# Patient Record
Sex: Female | Born: 1960 | Race: White | Hispanic: No | Marital: Married | Smoking: Never smoker
Health system: Southern US, Community
[De-identification: ages and names within clinical notes are randomized; demographics above are authoritative.]

## PROBLEM LIST (undated history)

## (undated) DIAGNOSIS — Z808 Family history of malignant neoplasm of other organs or systems: Secondary | ICD-10-CM

## (undated) DIAGNOSIS — Z800 Family history of malignant neoplasm of digestive organs: Secondary | ICD-10-CM

## (undated) DIAGNOSIS — Z8041 Family history of malignant neoplasm of ovary: Secondary | ICD-10-CM

## (undated) DIAGNOSIS — C801 Malignant (primary) neoplasm, unspecified: Secondary | ICD-10-CM

## (undated) DIAGNOSIS — Z806 Family history of leukemia: Secondary | ICD-10-CM

## (undated) DIAGNOSIS — Z803 Family history of malignant neoplasm of breast: Secondary | ICD-10-CM

## (undated) DIAGNOSIS — A770 Spotted fever due to Rickettsia rickettsii: Secondary | ICD-10-CM

## (undated) DIAGNOSIS — H469 Unspecified optic neuritis: Secondary | ICD-10-CM

## (undated) DIAGNOSIS — F419 Anxiety disorder, unspecified: Secondary | ICD-10-CM

## (undated) DIAGNOSIS — Z8042 Family history of malignant neoplasm of prostate: Secondary | ICD-10-CM

## (undated) DIAGNOSIS — F32A Depression, unspecified: Secondary | ICD-10-CM

## (undated) DIAGNOSIS — Z8489 Family history of other specified conditions: Secondary | ICD-10-CM

## (undated) DIAGNOSIS — E039 Hypothyroidism, unspecified: Secondary | ICD-10-CM

## (undated) DIAGNOSIS — R011 Cardiac murmur, unspecified: Secondary | ICD-10-CM

## (undated) DIAGNOSIS — Z923 Personal history of irradiation: Secondary | ICD-10-CM

## (undated) HISTORY — PX: DENTAL SURGERY: SHX609

## (undated) HISTORY — DX: Family history of malignant neoplasm of prostate: Z80.42

## (undated) HISTORY — DX: Hypothyroidism, unspecified: E03.9

## (undated) HISTORY — DX: Family history of malignant neoplasm of ovary: Z80.41

## (undated) HISTORY — DX: Malignant (primary) neoplasm, unspecified: C80.1

## (undated) HISTORY — DX: Family history of malignant neoplasm of digestive organs: Z80.0

## (undated) HISTORY — DX: Family history of malignant neoplasm of breast: Z80.3

## (undated) HISTORY — PX: TONSILLECTOMY: SHX5217

## (undated) HISTORY — DX: Family history of leukemia: Z80.6

## (undated) HISTORY — DX: Family history of malignant neoplasm of other organs or systems: Z80.8

## (undated) HISTORY — PX: TONSILLECTOMY: SUR1361

---

## 2001-03-23 ENCOUNTER — Ambulatory Visit (HOSPITAL_COMMUNITY): Admission: RE | Admit: 2001-03-23 | Discharge: 2001-03-23 | Payer: Self-pay | Admitting: Family Medicine

## 2004-10-27 HISTORY — PX: MELANOMA EXCISION: SHX5266

## 2004-10-27 HISTORY — PX: SENTINEL NODE BIOPSY: SHX6608

## 2009-05-04 ENCOUNTER — Ambulatory Visit (HOSPITAL_COMMUNITY): Admission: RE | Admit: 2009-05-04 | Discharge: 2009-05-04 | Payer: Self-pay | Admitting: Family Medicine

## 2010-11-17 ENCOUNTER — Encounter: Payer: Self-pay | Admitting: General Surgery

## 2011-12-31 ENCOUNTER — Other Ambulatory Visit (HOSPITAL_COMMUNITY): Payer: Self-pay | Admitting: *Deleted

## 2011-12-31 DIAGNOSIS — Z1231 Encounter for screening mammogram for malignant neoplasm of breast: Secondary | ICD-10-CM

## 2012-01-22 ENCOUNTER — Ambulatory Visit (HOSPITAL_COMMUNITY): Payer: Self-pay | Attending: *Deleted

## 2012-05-19 ENCOUNTER — Other Ambulatory Visit: Payer: Self-pay | Admitting: Nurse Practitioner

## 2012-05-25 ENCOUNTER — Ambulatory Visit
Admission: RE | Admit: 2012-05-25 | Discharge: 2012-05-25 | Disposition: A | Discharge status: Home / Self Care | Payer: No Typology Code available for payment source | Source: Ambulatory Visit | Attending: Nurse Practitioner | Admitting: Nurse Practitioner

## 2013-03-31 ENCOUNTER — Telehealth: Payer: Self-pay | Admitting: Clinical

## 2013-04-01 ENCOUNTER — Telehealth: Payer: Self-pay | Admitting: Clinical

## 2013-04-01 NOTE — Telephone Encounter (Signed)
Error in opening this encounter, wrong patient.

## 2013-04-01 NOTE — Telephone Encounter (Signed)
Error in opening this encounter.

## 2013-06-26 ENCOUNTER — Ambulatory Visit (INDEPENDENT_AMBULATORY_CARE_PROVIDER_SITE_OTHER): Payer: BC Managed Care – PPO | Admitting: Emergency Medicine

## 2013-06-26 VITALS — BP 100/68 | HR 95 | Temp 98.6°F | Resp 16 | Ht 65.0 in | Wt 121.0 lb

## 2013-06-26 DIAGNOSIS — C439 Malignant melanoma of skin, unspecified: Secondary | ICD-10-CM

## 2013-06-26 DIAGNOSIS — N390 Urinary tract infection, site not specified: Secondary | ICD-10-CM

## 2013-06-26 DIAGNOSIS — B009 Herpesviral infection, unspecified: Secondary | ICD-10-CM

## 2013-06-26 DIAGNOSIS — E039 Hypothyroidism, unspecified: Secondary | ICD-10-CM

## 2013-06-26 DIAGNOSIS — R300 Dysuria: Secondary | ICD-10-CM

## 2013-06-26 DIAGNOSIS — Z1211 Encounter for screening for malignant neoplasm of colon: Secondary | ICD-10-CM

## 2013-06-26 DIAGNOSIS — Z0000 Encounter for general adult medical examination without abnormal findings: Secondary | ICD-10-CM

## 2013-06-26 LAB — POCT URINALYSIS DIPSTICK
Bilirubin, UA: NEGATIVE
Glucose, UA: NEGATIVE
Ketones, UA: NEGATIVE
Nitrite, UA: NEGATIVE
Protein, UA: 100
Spec Grav, UA: 1.015
Urobilinogen, UA: 0.2
pH, UA: 5.5

## 2013-06-26 LAB — POCT UA - MICROSCOPIC ONLY
Crystals, Ur, HPF, POC: NEGATIVE
Yeast, UA: NEGATIVE

## 2013-06-26 LAB — LIPID PANEL
LDL Cholesterol: 196 mg/dL — ABNORMAL HIGH (ref 0–99)
Total CHOL/HDL Ratio: 4.6 Ratio
VLDL: 15 mg/dL (ref 0–40)

## 2013-06-26 LAB — COMPREHENSIVE METABOLIC PANEL
ALT: 10 U/L (ref 0–35)
Alkaline Phosphatase: 74 U/L (ref 39–117)
CO2: 26 mEq/L (ref 19–32)
Sodium: 141 mEq/L (ref 135–145)
Total Bilirubin: 0.5 mg/dL (ref 0.3–1.2)
Total Protein: 7.9 g/dL (ref 6.0–8.3)

## 2013-06-26 LAB — POCT CBC
Hemoglobin: 14.4 g/dL (ref 12.2–16.2)
MCH, POC: 31 pg (ref 27–31.2)
MCV: 95.5 fL (ref 80–97)
RBC: 4.64 M/uL (ref 4.04–5.48)
WBC: 12 10*3/uL — AB (ref 4.6–10.2)

## 2013-06-26 LAB — IFOBT (OCCULT BLOOD): IFOBT: NEGATIVE

## 2013-06-26 LAB — T4, FREE: Free T4: 1.03 ng/dL (ref 0.80–1.80)

## 2013-06-26 MED ORDER — VALACYCLOVIR HCL 500 MG PO TABS: 500.0000 mg | ORAL_TABLET | Freq: Every day | ORAL | Status: DC

## 2013-06-26 MED ORDER — CIPROFLOXACIN HCL 250 MG PO TABS: 250.0000 mg | ORAL_TABLET | Freq: Two times a day (BID) | ORAL | Status: DC

## 2013-06-26 NOTE — Patient Instructions (Signed)
Urinary Tract Infection  Urinary tract infections (UTIs) can develop anywhere along your urinary tract. Your urinary tract is your body's drainage system for removing wastes and extra water. Your urinary tract includes two kidneys, two ureters, a bladder, and a urethra. Your kidneys are a pair of bean-shaped organs. Each kidney is about the size of your fist. They are located below your ribs, one on each side of your spine.  CAUSES  Infections are caused by microbes, which are microscopic organisms, including fungi, viruses, and bacteria. These organisms are so small that they can only be seen through a microscope. Bacteria are the microbes that most commonly cause UTIs.  SYMPTOMS   Symptoms of UTIs may vary by age and gender of the patient and by the location of the infection. Symptoms in young women typically include a frequent and intense urge to urinate and a painful, burning feeling in the bladder or urethra during urination. Older women and men are more likely to be tired, shaky, and weak and have muscle aches and abdominal pain. A fever may mean the infection is in your kidneys. Other symptoms of a kidney infection include pain in your back or sides below the ribs, nausea, and vomiting.  DIAGNOSIS  To diagnose a UTI, your caregiver will ask you about your symptoms. Your caregiver also will ask to provide a urine sample. The urine sample will be tested for bacteria and white blood cells. White blood cells are made by your body to help fight infection.  TREATMENT   Typically, UTIs can be treated with medication. Because most UTIs are caused by a bacterial infection, they usually can be treated with the use of antibiotics. The choice of antibiotic and length of treatment depend on your symptoms and the type of bacteria causing your infection.  HOME CARE INSTRUCTIONS   If you were prescribed antibiotics, take them exactly as your caregiver instructs you. Finish the medication even if you feel better after you  have only taken some of the medication.   Drink enough water and fluids to keep your urine clear or pale yellow.   Avoid caffeine, tea, and carbonated beverages. They tend to irritate your bladder.   Empty your bladder often. Avoid holding urine for long periods of time.   Empty your bladder before and after sexual intercourse.   After a bowel movement, women should cleanse from front to back. Use each tissue only once.  SEEK MEDICAL CARE IF:    You have back pain.   You develop a fever.   Your symptoms do not begin to resolve within 3 days.  SEEK IMMEDIATE MEDICAL CARE IF:    You have severe back pain or lower abdominal pain.   You develop chills.   You have nausea or vomiting.   You have continued burning or discomfort with urination.  MAKE SURE YOU:    Understand these instructions.   Will watch your condition.   Will get help right away if you are not doing well or get worse.  Document Released: 07/23/2005 Document Revised: 04/13/2012 Document Reviewed: 11/21/2011  ExitCare Patient Information 2014 [employer].

## 2013-06-26 NOTE — Progress Notes (Signed)
Subjective:    Patient ID: Jean Morgan, female    DOB: 12-19-1960, 52 y.o.   MRN: [id number]  HPI  52 yo caucasian female presents to clinic for urinary frequency, burning with urination, lower abd pressure x 5days.  Pt took OTC Cranactin with some relief.  Pt requests a CPE today also-states she has not had CPE in 3 years due to lack of insurance.  At that time she was diagnosed with hypothyroid- pt chose to take natural remedy Hypothalmus PMG - purchased at [employer], [address], [place].   Pt feels she has gone through the change - no periods, has hot flashes.  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 Pt has history of Stage IV Melanoma - needs name of dermatologist to follow her for skin issues.      Review of Systems     Objective:   Physical Exam HEENT exam is unremarkable. Her neck is supple. Chest is clear to auscultation and percussion. Heart regular rate no murmurs rubs or gallops appreciated. Abdomen soft liver spleen not enlarged or no areas of tenderness. Uterus is normal size there are no adnexal masses. Extremities are without edema. There are 23 mm pigmented areas which are flat on the right leg without surrounding erythema and without dark pigment. Pelvic exam reveals a normal size uterus there are no adnexal masses rectal exam reveals no masses to be palpable hematuria was obtained. Pap smear was done   Results for orders placed in visit on 06/26/13  POCT URINALYSIS DIPSTICK      Result Value Range   Color, UA yellow     Clarity, UA cloudy     Glucose, UA neg     Bilirubin, UA neg     Ketones, UA neg     Spec Grav, UA 1.015     Blood, UA mod     pH, UA 5.5     Protein, UA 100     Urobilinogen, UA 0.2     Nitrite, UA neg     Leukocytes, UA small (1+)    POCT UA - MICROSCOPIC ONLY      Result Value Range   WBC, Ur, HPF, POC tntc     RBC, urine, microscopic tntc     Bacteria, U Microscopic 3+     Mucus, UA mod     Epithelial cells, urine  per micros 0-4     Crystals, Ur, HPF, POC neg     Casts, Ur, LPF, POC broad waxy     Yeast, UA neg    POCT CBC      Result Value Range   WBC 12.0 (*) 4.6 - 10.2 K/uL   Lymph, poc 3.4  0.6 - 3.4   POC LYMPH PERCENT 28.5  10 - 50 %L   MID (cbc) 0.6  0 - 0.9   POC MID % 5.4  0 - 12 %M   POC Granulocyte 7.9 (*) 2 - 6.9   Granulocyte percent 66.1  37 - 80 %G   RBC 4.64  4.04 - 5.48 M/uL   Hemoglobin 14.4  12.2 - 16.2 g/dL   HCT, POC 40.9  81.1 - 47.9 %   MCV 95.5  80 - 97 fL   MCH, POC 31.0  27 - 31.2 pg   MCHC 32.5  31.8 - 35.4 g/dL   RDW, POC 91.4     Platelet Count, POC 286  142 - 424 K/uL   MPV 9.8  0 - 99.8 fL  IFOBT (OCCULT BLOOD)      Result Value Range   IFOBT Negative          Assessment & Plan:  Patient prefers to get her Tdap at a different time. She definitely has a urinary tract infection. Urine culture was done we'll treat with Cipro 250 twice a day #10 and have her take probiotics. Ambulatory referrals made to GI and also to dermatology.

## 2013-06-28 LAB — PAP IG (IMAGE GUIDED)

## 2013-06-28 LAB — URINE CULTURE

## 2013-07-01 ENCOUNTER — Telehealth: Payer: Self-pay

## 2013-07-01 NOTE — Telephone Encounter (Signed)
Spoke to someone on Tuesday or Wednesday about getting her labs mailed to her and she has not received them yet she is wanting to make sure they were sent out Call back number is [phone]

## 2013-07-01 NOTE — Telephone Encounter (Signed)
They were mailed our mail is slow, it goes by snail, first it goes to the hospital. She is advised also have released labs in Mychart.

## 2013-09-01 ENCOUNTER — Other Ambulatory Visit: Payer: Self-pay

## 2013-10-18 ENCOUNTER — Ambulatory Visit: Payer: BC Managed Care – PPO | Admitting: Emergency Medicine

## 2013-11-21 ENCOUNTER — Telehealth: Payer: Self-pay

## 2013-11-21 DIAGNOSIS — B009 Herpesviral infection, unspecified: Secondary | ICD-10-CM

## 2013-11-21 MED ORDER — VALACYCLOVIR HCL 500 MG PO TABS: 500.0000 mg | ORAL_TABLET | Freq: Every day | ORAL | Status: DC

## 2013-11-21 NOTE — Telephone Encounter (Signed)
Refill sent. Pt advised.  

## 2013-11-21 NOTE — Telephone Encounter (Signed)
Patient needs a refill on Valtrex.  [phone]

## 2013-12-09 ENCOUNTER — Telehealth: Payer: Self-pay | Admitting: Pediatrics

## 2013-12-09 NOTE — Telephone Encounter (Signed)
This message was created in the parent's chart.  It should be created in the patient's chart.  Please close this message and create a new message in the appropriate chart.

## 2013-12-09 NOTE — Telephone Encounter (Signed)
Mom called wanted to know if she can get a rx for tamiflu the family that are living in the house are all sick with the flu so she wants to know if she can give the child something now before she gets sick

## 2014-03-03 ENCOUNTER — Ambulatory Visit (INDEPENDENT_AMBULATORY_CARE_PROVIDER_SITE_OTHER): Payer: BC Managed Care – PPO | Admitting: Physician Assistant

## 2014-03-03 VITALS — BP 112/64 | HR 76 | Temp 98.3°F | Resp 16 | Ht 68.75 in | Wt 134.4 lb

## 2014-03-03 DIAGNOSIS — H6980 Other specified disorders of Eustachian tube, unspecified ear: Secondary | ICD-10-CM

## 2014-03-03 DIAGNOSIS — H6990 Unspecified Eustachian tube disorder, unspecified ear: Secondary | ICD-10-CM

## 2014-03-03 DIAGNOSIS — H9209 Otalgia, unspecified ear: Secondary | ICD-10-CM

## 2014-03-03 MED ORDER — VALACYCLOVIR HCL 500 MG PO TABS
500.0000 mg | ORAL_TABLET | ORAL | Status: DC | PRN
Start: 2014-03-03 — End: 2014-09-01

## 2014-03-03 MED ORDER — IPRATROPIUM BROMIDE 0.03 % NA SOLN: 2.0000 | Freq: Two times a day (BID) | NASAL | Status: DC

## 2014-03-03 NOTE — Progress Notes (Signed)
   Subjective:    Patient ID: Jean Morgan, female    DOB: 16-Oct-1961, 53 y.o.   MRN: [id number]  HPI  53 year old female presents for evaluation of left ear pain x 2 days. Symptoms started yesterday and have continued today.  She also has noticed some lymph node swelling and tenderness on her left side.  Has pain with swallowing but no sore throat. Admits to some nasal congestion and PND that she attributes to allergies . Has been taking Zyrtec and Aleve which have helped.  Hx of ear infections with last episode "years" ago.   No fever, chills, headache, dizziness, nausea, vomiting, tinnitus, or ear drainage.  Patient also requesting refill on her valtrex that she takes as needed.    Review of Systems  Constitutional: Negative for fever and chills.  HENT: Positive for ear pain, postnasal drip and rhinorrhea. Negative for congestion and sore throat.   Eyes: Negative for visual disturbance.       Objective:   Physical Exam  Constitutional: She is oriented to person, place, and time. She appears well-developed and well-nourished.  HENT:  Head: Normocephalic and atraumatic.  Right Ear: Hearing, external ear and ear canal normal. A middle ear effusion is present.  Left Ear: Hearing, external ear and ear canal normal. No tenderness. Tympanic membrane is not erythematous and not bulging. A middle ear effusion is present.  Mouth/Throat: Oropharynx is clear and moist.  Eyes: Conjunctivae are normal.  Neck: Normal range of motion. Neck supple.  Cardiovascular: Normal rate, regular rhythm and normal heart sounds.   Pulmonary/Chest: Effort normal and breath sounds normal.  Lymphadenopathy:    She has cervical adenopathy (left side).  Neurological: She is alert and oriented to person, place, and time.  Psychiatric: She has a normal mood and affect. Her behavior is normal. Judgment and thought content normal.          Assessment & Plan:  ETD (eustachian tube dysfunction) - Plan: ipratropium  (ATROVENT) 0.03 % nasal spray  Will treat ETD with Atrovent NS twice daily. Continue zyrtec daily Aleve q12hours prn pain. May alternate with tylenol as needed Refilled Valtrex to take prn Consider amox if symptoms fail to improve in 48-72 hours Follow up if symptoms worsening or changing.

## 2014-09-01 ENCOUNTER — Ambulatory Visit (INDEPENDENT_AMBULATORY_CARE_PROVIDER_SITE_OTHER): Payer: BC Managed Care – PPO | Admitting: Emergency Medicine

## 2014-09-01 VITALS — BP 128/86 | HR 70 | Temp 98.4°F | Resp 16 | Ht 66.0 in | Wt 134.0 lb

## 2014-09-01 DIAGNOSIS — Z1239 Encounter for other screening for malignant neoplasm of breast: Secondary | ICD-10-CM

## 2014-09-01 DIAGNOSIS — B009 Herpesviral infection, unspecified: Secondary | ICD-10-CM

## 2014-09-01 DIAGNOSIS — Z1211 Encounter for screening for malignant neoplasm of colon: Secondary | ICD-10-CM

## 2014-09-01 DIAGNOSIS — Z0000 Encounter for general adult medical examination without abnormal findings: Secondary | ICD-10-CM

## 2014-09-01 DIAGNOSIS — C439 Malignant melanoma of skin, unspecified: Secondary | ICD-10-CM

## 2014-09-01 LAB — POCT UA - MICROSCOPIC ONLY
Bacteria, U Microscopic: NEGATIVE
CRYSTALS, UR, HPF, POC: NEGATIVE
Casts, Ur, LPF, POC: NEGATIVE
Epithelial cells, urine per micros: NEGATIVE
Mucus, UA: NEGATIVE
RBC, urine, microscopic: NEGATIVE
WBC, UR, HPF, POC: NEGATIVE
YEAST UA: NEGATIVE

## 2014-09-01 LAB — POCT URINALYSIS DIPSTICK
Bilirubin, UA: NEGATIVE
Blood, UA: NEGATIVE
GLUCOSE UA: NEGATIVE
Ketones, UA: NEGATIVE
Leukocytes, UA: NEGATIVE
Nitrite, UA: NEGATIVE
Protein, UA: NEGATIVE
SPEC GRAV UA: 1.01
UROBILINOGEN UA: 0.2
pH, UA: 7

## 2014-09-01 LAB — POCT CBC
Granulocyte percent: 57.4 %G (ref 37–80)
HCT, POC: 41.8 % (ref 37.7–47.9)
HEMOGLOBIN: 13.9 g/dL (ref 12.2–16.2)
Lymph, poc: 3.7 — AB (ref 0.6–3.4)
MCH: 30.7 pg (ref 27–31.2)
MCHC: 33.2 g/dL (ref 31.8–35.4)
MCV: 92.5 fL (ref 80–97)
MID (CBC): 0.4 (ref 0–0.9)
MPV: 7.8 fL (ref 0–99.8)
POC Granulocyte: 5.5 (ref 2–6.9)
POC LYMPH PERCENT: 38.7 %L (ref 10–50)
POC MID %: 3.9 %M (ref 0–12)
Platelet Count, POC: 307 10*3/uL (ref 142–424)
RBC: 4.52 M/uL (ref 4.04–5.48)
RDW, POC: 13.9 %
WBC: 9.5 10*3/uL (ref 4.6–10.2)

## 2014-09-01 MED ORDER — VALACYCLOVIR HCL 500 MG PO TABS: 500.0000 mg | ORAL_TABLET | ORAL | Status: DC | PRN

## 2014-09-01 NOTE — Progress Notes (Signed)
Subjective:    Patient ID: Jean Morgan, female    DOB: 04/11/1961, 53 y.o.   MRN: [id number] This chart was scribed for [place]. Everlene Farrier, MD by Steva Colder, ED Scribe. The patient was seen in room 10 at 10:37 AM.   Chief Complaint  Patient presents with  . Annual Exam    no pap    HPI Jean Morgan is a 53 y.o. female with a hx of Hypothyroidism who presents today complaining of annual exam without pap. She states that she wants to get her blood work done to see how her cholesterol is and her thyroid because she has Hypothyroidism.   She denies any associated symptoms. She denies having a PCP. She denies going to an OB/GYN recently. She states that she had stage 4 skin CA that was dx by Dr. Aniceto Boss and she was seen at [facility] for. She states that her surgery was in 2006. She states that she saw someone in town and was not pleased with them. 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    Patient Active Problem List   Diagnosis Date Noted  . Melanoma of skin, site unspecified 06/26/2013   Past Medical History  Diagnosis Date  . Hypothyroidism   . Cancer    Past Surgical History  Procedure Laterality Date  . Dental surgery    . Tonsillectomy     Allergies  Allergen Reactions  . Latex    Prior to Admission medications   Medication Sig Start Date End Date Taking? Authorizing Provider  valACYclovir (VALTREX) 500 MG tablet Take 1 tablet (500 mg total) by mouth as needed. 03/03/14  Yes Heather M Marte, PA-C  ipratropium (ATROVENT) 0.03 % nasal spray Place 2 sprays into the nose 2 (two) times daily. 03/03/14   Collene Leyden, PA-C      Review of Systems  Constitutional: Negative for fever and chills.  HENT: Negative for congestion, ear pain and rhinorrhea.   Neurological: Negative for weakness and headaches.  All other systems reviewed and are negative.     Objective:   Physical Exam  Constitutional: She is oriented to person, place, and time.  She appears well-developed and well-nourished. No distress.  HENT:  Head: Normocephalic and atraumatic.  Eyes: EOM are normal.  Neck: Neck supple. No tracheal deviation present.  Cardiovascular: Normal rate.   Pulmonary/Chest: Effort normal. No respiratory distress.  Genitourinary:  External genitalia is normal. No masses.   Musculoskeletal: Normal range of motion.  Neurological: She is alert and oriented to person, place, and time.  Skin: Skin is warm and dry.  No suspicious lesions noted. 3-4 mm papilloma on the right nipple.   Psychiatric: She has a normal mood and affect. Her behavior is normal.  Nursing note and vitals reviewed.  Results for orders placed or performed in visit on 09/01/14  POCT urinalysis dipstick  Result Value Ref Range   Color, UA light yellow    Clarity, UA clear    Glucose, UA neg    Bilirubin, UA neg    Ketones, UA neg    Spec Grav, UA 1.010    Blood, UA neg    pH, UA 7.0    Protein, UA neg    Urobilinogen, UA 0.2    Nitrite, UA neg    Leukocytes, UA Negative   POCT UA - Microscopic Only  Result Value Ref Range   WBC, Ur, HPF, POC neg  RBC, urine, microscopic neg    Bacteria, U Microscopic neg    Mucus, UA neg    Epithelial cells, urine per micros neg    Crystals, Ur, HPF, POC neg    Casts, Ur, LPF, POC neg    Yeast, UA neg   POCT CBC  Result Value Ref Range   WBC 9.5 4.6 - 10.2 K/uL   Lymph, poc 3.7 (A) 0.6 - 3.4   POC LYMPH PERCENT 38.7 10 - 50 %L   MID (cbc) 0.4 0 - 0.9   POC MID % 3.9 0 - 12 %M   POC Granulocyte 5.5 2 - 6.9   Granulocyte percent 57.4 37 - 80 %G   RBC 4.52 4.04 - 5.48 M/uL   Hemoglobin 13.9 12.2 - 16.2 g/dL   HCT, POC 41.8 37.7 - 47.9 %   MCV 92.5 80 - 97 fL   MCH, POC 30.7 27 - 31.2 pg   MCHC 33.2 31.8 - 35.4 g/dL   RDW, POC 13.9 %   Platelet Count, POC 307 142 - 424 K/uL   MPV 7.8 0 - 99.8 fL     BP 128/86 mmHg  Pulse 70  Temp(Src) 98.4 F (36.9 C) (Oral)  Resp 16  Ht 5\' 6"  (1.676 m)  Wt 134 lb  (60.782 kg)  BMI 21.64 kg/m2  SpO2 100% Assessment & Plan:  DIAGNOSTIC STUDIES: Oxygen Saturation is 100% on room air, normal by my interpretation.    COORDINATION OF CARE: 10:47 AM-Discussed treatment plan which includes CBC, UA, and CMAT and lipid check, medication refill with pt at bedside and pt agreed to plan.    I personally performed the services described in this documentation, which was scribed in my presence. The recorded information has been reviewed and is accurate.

## 2014-09-02 LAB — COMPREHENSIVE METABOLIC PANEL
ALT: 14 U/L (ref 0–35)
AST: 19 U/L (ref 0–37)
Albumin: 4.6 g/dL (ref 3.5–5.2)
Alkaline Phosphatase: 69 U/L (ref 39–117)
BUN: 9 mg/dL (ref 6–23)
CALCIUM: 9.7 mg/dL (ref 8.4–10.5)
CHLORIDE: 104 meq/L (ref 96–112)
CO2: 24 meq/L (ref 19–32)
CREATININE: 0.68 mg/dL (ref 0.50–1.10)
Glucose, Bld: 85 mg/dL (ref 70–99)
Potassium: 4.6 mEq/L (ref 3.5–5.3)
Sodium: 140 mEq/L (ref 135–145)
Total Bilirubin: 0.5 mg/dL (ref 0.2–1.2)
Total Protein: 7.3 g/dL (ref 6.0–8.3)

## 2014-09-02 LAB — TSH: TSH: 3.145 u[IU]/mL (ref 0.350–4.500)

## 2014-09-02 LAB — T4, FREE: Free T4: 1.07 ng/dL (ref 0.80–1.80)

## 2014-09-03 ENCOUNTER — Encounter: Payer: Self-pay | Admitting: *Deleted

## 2014-09-06 ENCOUNTER — Telehealth: Payer: Self-pay

## 2014-09-06 DIAGNOSIS — Z0000 Encounter for general adult medical examination without abnormal findings: Secondary | ICD-10-CM

## 2014-09-06 NOTE — Telephone Encounter (Signed)
Can we add these for her?

## 2014-09-06 NOTE — Telephone Encounter (Signed)
Pt states she had tests done and didn't know if we checked her chol level since it was high already, please call [phone]

## 2014-09-06 NOTE — Telephone Encounter (Signed)
Patient says there was no cholesterol or liver panel blood work done at her last visit and she concerned about those levels.

## 2014-09-07 ENCOUNTER — Encounter: Payer: Self-pay | Admitting: Internal Medicine

## 2014-09-07 NOTE — Telephone Encounter (Signed)
On the comprehensive medical panel her liver tests were done and were normal. She does need to have a lipid panel. I believe it was not done because she was not fasting at the time. We can place and orders only and she can have a fasting lipid panel done without seeing me.

## 2014-09-08 ENCOUNTER — Ambulatory Visit (AMBULATORY_SURGERY_CENTER): Payer: BC Managed Care – PPO | Admitting: *Deleted

## 2014-09-08 VITALS — Ht 66.0 in | Wt 134.0 lb

## 2014-09-08 DIAGNOSIS — Z1211 Encounter for screening for malignant neoplasm of colon: Secondary | ICD-10-CM

## 2014-09-08 MED ORDER — MOVIPREP 100 G PO SOLR: 1.0000 | Freq: Once | ORAL | Status: DC

## 2014-09-08 NOTE — Progress Notes (Signed)
No egg or soy allergy. No anesthesia problems.  No home O2.  No diet meds.  

## 2014-09-09 NOTE — Telephone Encounter (Signed)
Pt will be in for lipid test 09/10/14

## 2014-09-13 ENCOUNTER — Other Ambulatory Visit (INDEPENDENT_AMBULATORY_CARE_PROVIDER_SITE_OTHER): Payer: BC Managed Care – PPO | Admitting: Radiology

## 2014-09-13 DIAGNOSIS — Z1239 Encounter for other screening for malignant neoplasm of breast: Secondary | ICD-10-CM

## 2014-09-13 DIAGNOSIS — Z0000 Encounter for general adult medical examination without abnormal findings: Secondary | ICD-10-CM

## 2014-09-13 LAB — LIPID PANEL
Cholesterol: 273 mg/dL — ABNORMAL HIGH (ref 0–200)
HDL: 68 mg/dL (ref >39)
LDL Cholesterol: 188 mg/dL — ABNORMAL HIGH (ref 0–99)
TRIGLYCERIDES: 84 mg/dL (ref <150)
Total CHOL/HDL Ratio: 4 Ratio
VLDL: 17 mg/dL (ref 0–40)

## 2014-09-13 NOTE — Progress Notes (Signed)
Pt here for labs only. 

## 2014-09-14 NOTE — Progress Notes (Signed)
Patient is scheduled to have a mammogram on 09/30/2014

## 2014-09-15 ENCOUNTER — Encounter: Payer: Self-pay | Admitting: Internal Medicine

## 2014-09-29 ENCOUNTER — Ambulatory Visit (HOSPITAL_COMMUNITY)
Admission: RE | Admit: 2014-09-29 | Discharge: 2014-09-29 | Disposition: A | Discharge status: Home / Self Care | Payer: BC Managed Care – PPO | Source: Ambulatory Visit | Attending: Emergency Medicine | Admitting: Emergency Medicine

## 2014-09-29 DIAGNOSIS — Z1231 Encounter for screening mammogram for malignant neoplasm of breast: Secondary | ICD-10-CM | POA: Diagnosis not present

## 2014-10-02 ENCOUNTER — Other Ambulatory Visit: Payer: Self-pay | Admitting: Emergency Medicine

## 2014-10-02 ENCOUNTER — Encounter: Payer: Self-pay | Admitting: Internal Medicine

## 2014-10-02 ENCOUNTER — Ambulatory Visit (AMBULATORY_SURGERY_CENTER): Payer: BC Managed Care – PPO | Admitting: Internal Medicine

## 2014-10-02 VITALS — BP 162/95 | HR 65 | Temp 98.2°F | Resp 14 | Ht 66.0 in | Wt 134.0 lb

## 2014-10-02 DIAGNOSIS — Z1231 Encounter for screening mammogram for malignant neoplasm of breast: Secondary | ICD-10-CM

## 2014-10-02 DIAGNOSIS — Z1211 Encounter for screening for malignant neoplasm of colon: Secondary | ICD-10-CM

## 2014-10-02 MED ORDER — SODIUM CHLORIDE 0.9 % IV SOLN: 500.0000 mL | INTRAVENOUS | Status: DC

## 2014-10-02 NOTE — Op Note (Signed)
[facility]  [address] [place], [id number]   COLONOSCOPY PROCEDURE REPORT  PATIENT: Jean Morgan, Jean Morgan  MR#: [id number] BIRTHDATE: Jun 29, 1961 , 58  yrs. old GENDER: female ENDOSCOPIST: Jerene Bears, MD REFERRED [id number] Everlene Farrier, M.D. PROCEDURE DATE:  10/02/2014 PROCEDURE:   Colonoscopy, screening First Screening Colonoscopy - Avg.  risk and is 50 yrs.  old or older Yes.  Prior Negative Screening - Now for repeat screening. N/A  History of Adenoma - Now for follow-up colonoscopy & has been > or = to 3 yrs.  N/A  Polyps Removed Today? No.  Polyps Removed Today? No.  Recommend repeat exam, <10 yrs? Polyps Removed Today? No.  Recommend repeat exam, <10 yrs? No. ASA CLASS:   Class II INDICATIONS:average risk for colon cancer and first colonoscopy. MEDICATIONS: Propofol 260 mg IV and Monitored anesthesia care  DESCRIPTION OF PROCEDURE:   After the risks benefits and alternatives of the procedure were thoroughly explained, informed consent was obtained.  The digital rectal exam revealed no abnormalities of the rectum.   The LB [id number]  endoscope was introduced through the anus and advanced to the cecum, which was identified by both the appendix and ileocecal valve. No adverse events experienced.   The quality of the prep was good, using MoviPrep  The instrument was then slowly withdrawn as the colon was fully examined.     COLON FINDINGS: A normal appearing cecum, ileocecal valve, and appendiceal orifice were identified.  The ascending, transverse, descending, sigmoid colon, and rectum appeared unremarkable. Retroflexed views revealed small internal hemorrhoids. The time to cecum=5 minutes 56 seconds.  Withdrawal time=8 minutes 36 seconds. The scope was withdrawn and the procedure completed.  COMPLICATIONS: There were no immediate complications.  ENDOSCOPIC IMPRESSION: Normal colonoscopy, small internal hemorrhoids  RECOMMENDATIONS: You should continue  to follow colorectal cancer screening guidelines for "routine risk" patients with a repeat colonoscopy in 10 years. There is no need for FOBT (stool) testing for at least 5 years.  eSigned:  Jerene Bears, MD 10/02/2014 8:52 AM   cc: Arlyss Queen, MD and The Patient

## 2014-10-02 NOTE — Progress Notes (Signed)
Report to PACU, RN, vss, BBS= Clear.  

## 2014-10-02 NOTE — Patient Instructions (Signed)
Discharge instructions given. Normal exam. Resume previous medications. YOU HAD AN ENDOSCOPIC PROCEDURE TODAY AT THE [facility]: Refer to the procedure report that was given to you for any specific questions about what was found during the examination.  If the procedure report does not answer your questions, please call your gastroenterologist to clarify.  If you requested that your care partner not be given the details of your procedure findings, then the procedure report has been included in a sealed envelope for you to review at your convenience later.  YOU SHOULD EXPECT: Some feelings of bloating in the abdomen. Passage of more gas than usual.  Walking can help get rid of the air that was put into your GI tract during the procedure and reduce the bloating. If you had a lower endoscopy (such as a colonoscopy or flexible sigmoidoscopy) you may notice spotting of blood in your stool or on the toilet paper. If you underwent a bowel prep for your procedure, then you may not have a normal bowel movement for a few days.  DIET: Your first meal following the procedure should be a light meal and then it is ok to progress to your normal diet.  A half-sandwich or bowl of soup is an example of a good first meal.  Heavy or fried foods are harder to digest and may make you feel nauseous or bloated.  Likewise meals heavy in dairy and vegetables can cause extra gas to form and this can also increase the bloating.  Drink plenty of fluids but you should avoid alcoholic beverages for 24 hours.  ACTIVITY: Your care partner should take you home directly after the procedure.  You should plan to take it easy, moving slowly for the rest of the day.  You can resume normal activity the day after the procedure however you should NOT DRIVE or use heavy machinery for 24 hours (because of the sedation medicines used during the test).    SYMPTOMS TO REPORT IMMEDIATELY: A gastroenterologist can be reached at any hour.   During normal business hours, 8:30 AM to 5:00 PM Monday through Friday, call [phone].  After hours and on weekends, please call the GI answering service at [phone] who will take a message and have the physician on call contact you.   Following lower endoscopy (colonoscopy or flexible sigmoidoscopy):  Excessive amounts of blood in the stool  Significant tenderness or worsening of abdominal pains  Swelling of the abdomen that is new, acute  Fever of 100F or higher  FOLLOW UP: If any biopsies were taken you will be contacted by phone or by letter within the next 1-3 weeks.  Call your gastroenterologist if you have not heard about the biopsies in 3 weeks.  Our staff will call the home number listed on your records the next business day following your procedure to check on you and address any questions or concerns that you may have at that time regarding the information given to you following your procedure. This is a courtesy call and so if there is no answer at the home number and we have not heard from you through the emergency physician on call, we will assume that you have returned to your regular daily activities without incident.  SIGNATURES/CONFIDENTIALITY: You and/or your care partner have signed paperwork which will be entered into your electronic medical record.  These signatures attest to the fact that that the information above on your After Visit Summary has been reviewed and is understood.    Full responsibility of the confidentiality of this discharge information lies with you and/or your care-partner.

## 2014-10-03 ENCOUNTER — Telehealth: Payer: Self-pay

## 2014-10-03 NOTE — Telephone Encounter (Signed)
Pt is returning call to Amy

## 2014-10-03 NOTE — Telephone Encounter (Signed)
  Follow up Call-  Call back number 10/02/2014  Post procedure Call Back phone  # [phone]  Permission to leave phone message Yes     Patient questions:  Do you have a fever, pain , or abdominal swelling? No. Pain Score  0 *  Have you tolerated food without any problems? Yes.    Have you been able to return to your normal activities? No.  Do you have any questions about your discharge instructions: Diet   No. Medications  No. Follow up visit  No.  Do you have questions or concerns about your Care? No.  Actions: * If pain score is 4 or above: No action needed, pain <4.

## 2014-10-04 ENCOUNTER — Other Ambulatory Visit: Payer: Self-pay | Admitting: Emergency Medicine

## 2014-10-04 ENCOUNTER — Other Ambulatory Visit: Payer: Self-pay | Admitting: Radiology

## 2014-10-04 DIAGNOSIS — R928 Other abnormal and inconclusive findings on diagnostic imaging of breast: Secondary | ICD-10-CM

## 2014-10-04 DIAGNOSIS — N6489 Other specified disorders of breast: Secondary | ICD-10-CM

## 2014-10-04 NOTE — Telephone Encounter (Signed)
Spoke to her, she has not gotten call yet for mammogram views / [place]. Screening MM was at [facility].

## 2014-10-05 NOTE — Telephone Encounter (Signed)
Have spoken to referrals, [facility] does screening, Butch Penny will make sure this gets sent to the [facility].

## 2014-10-12 ENCOUNTER — Ambulatory Visit
Admission: RE | Admit: 2014-10-12 | Discharge: 2014-10-12 | Disposition: A | Discharge status: Home / Self Care | Payer: BC Managed Care – PPO | Source: Ambulatory Visit | Attending: Emergency Medicine | Admitting: Emergency Medicine

## 2014-10-12 DIAGNOSIS — R928 Other abnormal and inconclusive findings on diagnostic imaging of breast: Secondary | ICD-10-CM

## 2014-10-18 ENCOUNTER — Other Ambulatory Visit: Payer: BC Managed Care – PPO

## 2014-11-03 ENCOUNTER — Telehealth: Payer: Self-pay

## 2014-11-03 NOTE — Telephone Encounter (Signed)
Patients 55 year old brother passed away and she is requesting valium for a few days to deal with his loss.   [facility] on [address]   [phone]

## 2014-11-04 ENCOUNTER — Other Ambulatory Visit: Payer: Self-pay | Admitting: Emergency Medicine

## 2014-11-04 MED ORDER — DIAZEPAM 5 MG PO TABS: ORAL_TABLET | ORAL | Status: DC

## 2014-11-04 NOTE — Telephone Encounter (Signed)
Faxed. Pt notified 

## 2014-11-04 NOTE — Telephone Encounter (Signed)
Tell her I am so sorry for her loss. Rx can be picked up at [address]

## 2015-03-16 ENCOUNTER — Other Ambulatory Visit: Payer: Self-pay | Admitting: Emergency Medicine

## 2015-04-04 ENCOUNTER — Encounter (HOSPITAL_COMMUNITY): Payer: Self-pay | Admitting: Emergency Medicine

## 2015-04-04 ENCOUNTER — Emergency Department (HOSPITAL_COMMUNITY)
Admission: EM | Admit: 2015-04-04 | Discharge: 2015-04-04 | Disposition: A | Discharge status: Home / Self Care | Payer: BC Managed Care – PPO | Source: Home / Self Care | Attending: Family Medicine | Admitting: Family Medicine

## 2015-04-04 DIAGNOSIS — N390 Urinary tract infection, site not specified: Secondary | ICD-10-CM | POA: Diagnosis not present

## 2015-04-04 LAB — POCT URINALYSIS DIP (DEVICE)
BILIRUBIN URINE: NEGATIVE
GLUCOSE, UA: NEGATIVE mg/dL
Hgb urine dipstick: NEGATIVE
NITRITE: NEGATIVE
PH: 5.5 (ref 5.0–8.0)
PROTEIN: NEGATIVE mg/dL
Specific Gravity, Urine: 1.01 (ref 1.005–1.030)
Urobilinogen, UA: 0.2 mg/dL (ref 0.0–1.0)

## 2015-04-04 MED ORDER — TERCONAZOLE 80 MG VA SUPP: 80.0000 mg | Freq: Every day | VAGINAL | Status: DC

## 2015-04-04 MED ORDER — FLUCONAZOLE 150 MG PO TABS: 150.0000 mg | ORAL_TABLET | Freq: Once | ORAL | Status: DC

## 2015-04-04 MED ORDER — CEPHALEXIN 500 MG PO CAPS: 500.0000 mg | ORAL_CAPSULE | Freq: Four times a day (QID) | ORAL | Status: DC

## 2015-04-04 NOTE — ED Provider Notes (Signed)
CSN: [id number]     Arrival date & time 04/04/15  1628 History   First MD Initiated Contact with Patient 04/04/15 1656     Chief Complaint  Patient presents with  . Vaginitis   (Consider location/radiation/quality/duration/timing/severity/associated sxs/prior Treatment) Patient is a 54 y.o. female presenting with frequency. The history is provided by the patient.  Urinary Frequency This is a new problem. The current episode started more than 1 week ago (has tried 5 days of monistat.). The problem has not changed since onset.Associated symptoms include abdominal pain. Pertinent negatives include no chest pain.    Past Medical History  Diagnosis Date  . Hypothyroidism   . Cancer     skin   Past Surgical History  Procedure Laterality Date  . Dental surgery    . Tonsillectomy     Family History  Problem Relation Age of Onset  . Colon polyps Mother   . Colitis Mother   . Heart disease Father   . Colon cancer Paternal Aunt   . Diabetes Maternal Grandmother    History  Substance Use Topics  . Smoking status: Never Smoker   . Smokeless tobacco: Never Used  . Alcohol Use: 0.0 oz/week    1-3 Glasses of wine per week   OB History    No data available     Review of Systems  Constitutional: Negative.  Negative for fever.  Cardiovascular: Negative for chest pain.  Gastrointestinal: Positive for abdominal pain. Negative for nausea and vomiting.  Genitourinary: Positive for dysuria, urgency, frequency and pelvic pain. Negative for vaginal bleeding, vaginal discharge and menstrual problem.    Allergies  Latex  Home Medications   Prior to Admission medications   Medication Sig Start Date End Date Taking? Authorizing Provider  Ascorbic Acid (VITAMIN C ER PO) Take by mouth.    Historical Provider, MD  cephALEXin (KEFLEX) 500 MG capsule Take 1 capsule (500 mg total) by mouth 4 (four) times daily. Take all of medicine and drink lots of fluids 04/04/15   Billy Fischer, MD   Cholecalciferol (VITAMIN D PO) Take by mouth.    Historical Provider, MD  diazepam (VALIUM) 5 MG tablet Take one half to one every 6 to 8 hours for stress 11/04/14   Darlyne Russian, MD  fluconazole (DIFLUCAN) 150 MG tablet Take 1 tablet (150 mg total) by mouth once. May repeat in 1 week 04/04/15   Billy Fischer, MD  fluorouracil (EFUDEX) 5 % cream  09/28/14   Historical Provider, MD  GARLIC PO Take by mouth.    Historical Provider, MD  terconazole (TERAZOL 3) 80 MG vaginal suppository Place 1 suppository (80 mg total) vaginally at bedtime. 04/04/15   Billy Fischer, MD  valACYclovir (VALTREX) 500 MG tablet TAKE 1 TABLET BY MOUTH EVERY DAY AS NEEDED 03/16/15   Darlyne Russian, MD   BP 144/85 mmHg  Pulse 86  Temp(Src) 98.5 F (36.9 C) (Oral)  Resp 16  SpO2 100% Physical Exam  Constitutional: She is oriented to person, place, and time. She appears well-developed and well-nourished. No distress.  Abdominal: Soft. Bowel sounds are normal. She exhibits no mass. There is tenderness in the suprapubic area. There is no rigidity, no rebound and no guarding.  Neurological: She is alert and oriented to person, place, and time.  Skin: Skin is warm and dry.  Nursing note and vitals reviewed.   ED Course  Procedures (including critical care time) Labs Review Labs Reviewed  POCT URINALYSIS DIP (DEVICE) -  Abnormal; Notable for the following:    Ketones, ur TRACE (*)    Leukocytes, UA SMALL (*)    All other components within normal limits    Imaging Review No results found.   MDM   1. UTI (lower urinary tract infection)        Billy Fischer, MD 04/04/15 [phone]

## 2015-04-05 ENCOUNTER — Telehealth: Payer: Self-pay | Admitting: *Deleted

## 2015-04-05 NOTE — Telephone Encounter (Signed)
Pt called has appointment 04/06/15 as new patient was seen at urgent care for uti and yeast having problems with Rx keflex burning tongue. I advised pt that to keep scheduled appointment with nancy tomorrow, as [place] of the [place]. No other symptoms were noted by patient besides the burning of the tongue.

## 2015-04-06 ENCOUNTER — Ambulatory Visit: Payer: Self-pay | Admitting: Women's Health

## 2015-07-31 ENCOUNTER — Encounter: Payer: Self-pay | Admitting: Emergency Medicine

## 2015-09-28 ENCOUNTER — Other Ambulatory Visit: Payer: Self-pay | Admitting: Emergency Medicine

## 2015-10-01 NOTE — Telephone Encounter (Signed)
Called pt who reported that she was given #20 last Oct 31, 2022 when her brother died. She only has about #2 left and with holidays and anniversary of his death coming up, it has been a "little rough" and would like to have some on hand in case needed.

## 2015-10-01 NOTE — Telephone Encounter (Signed)
Please call patient and get clarification about what she takes the Valium for. Then forward back to me.

## 2015-10-02 NOTE — Telephone Encounter (Signed)
Rx faxed

## 2015-11-20 ENCOUNTER — Encounter: Payer: Self-pay | Admitting: Physician Assistant

## 2015-12-26 ENCOUNTER — Encounter: Payer: Self-pay | Admitting: Family Medicine

## 2016-01-02 ENCOUNTER — Encounter: Payer: Self-pay | Admitting: Family Medicine

## 2016-07-12 ENCOUNTER — Ambulatory Visit (INDEPENDENT_AMBULATORY_CARE_PROVIDER_SITE_OTHER): Payer: BC Managed Care – PPO | Admitting: Physician Assistant

## 2016-07-12 ENCOUNTER — Encounter: Payer: Self-pay | Admitting: Physician Assistant

## 2016-07-12 VITALS — BP 118/80 | HR 83 | Temp 98.5°F | Resp 18 | Ht 66.0 in | Wt 133.0 lb

## 2016-07-12 DIAGNOSIS — Z760 Encounter for issue of repeat prescription: Secondary | ICD-10-CM | POA: Diagnosis not present

## 2016-07-12 DIAGNOSIS — R1032 Left lower quadrant pain: Secondary | ICD-10-CM | POA: Diagnosis not present

## 2016-07-12 DIAGNOSIS — Z1329 Encounter for screening for other suspected endocrine disorder: Secondary | ICD-10-CM

## 2016-07-12 DIAGNOSIS — R300 Dysuria: Secondary | ICD-10-CM | POA: Diagnosis not present

## 2016-07-12 LAB — POCT WET + KOH PREP
Trich by wet prep: ABSENT
Yeast by KOH: ABSENT
Yeast by wet prep: ABSENT

## 2016-07-12 LAB — POCT CBC
GRANULOCYTE PERCENT: 59.2 % (ref 37–80)
HEMATOCRIT: 38.3 % (ref 37.7–47.9)
Hemoglobin: 13.5 g/dL (ref 12.2–16.2)
Lymph, poc: 3.8 — AB (ref 0.6–3.4)
MCH: 31.4 pg — AB (ref 27–31.2)
MCHC: 35.3 g/dL (ref 31.8–35.4)
MCV: 88.9 fL (ref 80–97)
MID (CBC): 0.6 (ref 0–0.9)
MPV: 7.8 fL (ref 0–99.8)
POC GRANULOCYTE: 6.4 (ref 2–6.9)
POC LYMPH PERCENT: 35 %L (ref 10–50)
POC MID %: 5.8 % (ref 0–12)
Platelet Count, POC: 265 10*3/uL (ref 142–424)
RBC: 4.31 M/uL (ref 4.04–5.48)
RDW, POC: 13 %
WBC: 10.8 10*3/uL — AB (ref 4.6–10.2)

## 2016-07-12 LAB — POCT URINALYSIS DIP (MANUAL ENTRY)
Bilirubin, UA: NEGATIVE
GLUCOSE UA: NEGATIVE
Ketones, POC UA: NEGATIVE
Leukocytes, UA: NEGATIVE
Nitrite, UA: NEGATIVE
Protein Ur, POC: NEGATIVE
RBC UA: NEGATIVE
UROBILINOGEN UA: 0.2
pH, UA: 5.5

## 2016-07-12 LAB — POC MICROSCOPIC URINALYSIS (UMFC): Mucus: ABSENT

## 2016-07-12 LAB — TSH: TSH: 2.52 m[IU]/L

## 2016-07-12 MED ORDER — VALACYCLOVIR HCL 1 G PO TABS: 1000.0000 mg | ORAL_TABLET | Freq: Two times a day (BID) | ORAL | 0 refills | Status: DC

## 2016-07-12 NOTE — Progress Notes (Signed)
07/12/2016 2:41 PM   DOB: Jan 19, 1961 / MRN: [id number]  SUBJECTIVE:  Jean Morgan is a 55 y.o. female presenting for possible UTI.  She got a test kit at the drug store and this was positive for leukocytes. She associates pain with urination that she describes as burning or spasm.  She associates urgency and frequency.  She denies fever and flank pain today.    She denies any abnormal vaginal discharge. She is having constipation on and off with some diarrhea.    She is allergic to latex.   She  has a past medical history of Cancer ([place]) and Hypothyroidism.    She  reports that she has never smoked. She has never used smokeless tobacco. She reports that she drinks alcohol. She reports that she does not use drugs. She  has no sexual activity history on file. The patient  has a past surgical history that includes Dental surgery and Tonsillectomy.  Her family history includes Colitis in her mother; Colon cancer in her paternal aunt; Colon polyps in her mother; Diabetes in her maternal grandmother; Heart disease in her father.  ROS  The problem list and medications were reviewed and updated by myself where necessary and exist elsewhere in the encounter.   OBJECTIVE:  BP 118/80   Pulse 83   Temp 98.5 F (36.9 C) (Oral)   Resp 18   Ht '5\' 6"'  (1.676 m)   Wt 133 lb (60.3 kg)   SpO2 98%   BMI 21.47 kg/m   Physical Exam  Constitutional: She is oriented to person, place, and time.  Cardiovascular: Normal rate and regular rhythm.   Pulmonary/Chest: Effort normal and breath sounds normal.  Abdominal: Soft. Bowel sounds are normal. She exhibits no distension and no mass. There is tenderness. There is no rebound and no guarding.  Genitourinary: Vagina normal. Cervix exhibits no motion tenderness, no discharge and no friability. No vaginal discharge found.  Musculoskeletal: Normal range of motion.  Neurological: She is alert and oriented to person, place, and time. She has normal reflexes.  No cranial nerve deficit.  Skin: Skin is warm and dry.  Psychiatric: She has a normal mood and affect.    Results for orders placed or performed in visit on 07/12/16 (from the past 72 hour(s))  POCT urinalysis dipstick     Status: None   Collection Time: 07/12/16  1:57 PM  Result Value Ref Range   Color, UA yellow yellow   Clarity, UA clear clear   Glucose, UA negative negative   Bilirubin, UA negative negative   Ketones, POC UA negative negative   Spec Grav, UA <=1.005    Blood, UA negative negative   pH, UA 5.5    Protein Ur, POC negative negative   Urobilinogen, UA 0.2    Nitrite, UA Negative Negative   Leukocytes, UA Negative Negative  POCT Microscopic Urinalysis ([facility])     Status: None   Collection Time: 07/12/16  1:58 PM  Result Value Ref Range   WBC,UR,HPF,POC None None WBC/hpf   RBC,UR,HPF,POC None None RBC/hpf   Bacteria None None, Too numerous to count   Mucus Absent Absent   Epithelial Cells, UR Per Microscopy None None, Too numerous to count cells/hpf  POCT CBC     Status: Abnormal   Collection Time: 07/12/16  2:23 PM  Result Value Ref Range   WBC 10.8 (A) 4.6 - 10.2 K/uL   Lymph, poc 3.8 (A) 0.6 - 3.4   POC LYMPH PERCENT 35.0  10 - 50 %L   MID (cbc) 0.6 0 - 0.9   POC MID % 5.8 0 - 12 %M   POC Granulocyte 6.4 2 - 6.9   Granulocyte percent 59.2 37 - 80 %G   RBC 4.31 4.04 - 5.48 M/uL   Hemoglobin 13.5 12.2 - 16.2 g/dL   HCT, POC 38.3 37.7 - 47.9 %   MCV 88.9 80 - 97 fL   MCH, POC 31.4 (A) 27 - 31.2 pg   MCHC 35.3 31.8 - 35.4 g/dL   RDW, POC 13.0 %   Platelet Count, POC 265 142 - 424 K/uL   MPV 7.8 0 - 99.8 fL  POCT Wet + KOH Prep     Status: None   Collection Time: 07/12/16  2:25 PM  Result Value Ref Range   Yeast by KOH Absent Present, Absent   Yeast by wet prep Absent Present, Absent   WBC by wet prep None None, Few, Too numerous to count   Clue Cells Wet Prep HPF POC None None, Too numerous to count   Trich by wet prep Absent Present, Absent    Bacteria Wet Prep HPF POC None None, Few, Too numerous to count   Epithelial Cells By [employer] ([facility]) None None, Few, Too numerous to count   RBC,UR,HPF,POC None None RBC/hpf    No results found.  ASSESSMENT AND PLAN  Nikaya was seen today for dysuria, abdominal pain and medication refill.  Diagnoses and all orders for this visit:  Dysuria: There does not appear to be any pathology regarding her work up.  Will hold off on treatment for now. Low threshold for urinary abx should she not improve with time.   -     POCT urinalysis dipstick -     POCT Microscopic Urinalysis ([facility]) -     POCT Wet + KOH Prep  LLQ abdominal pain -     POCT CBC  Thyroid disorder screening -     TSH  Medication refill -     valACYclovir (VALTREX) 1000 MG tablet; Take 1 tablet (1,000 mg total) by mouth 2 (two) times daily.    The patient is advised to call or return to clinic if she does not see an improvement in symptoms, or to seek the care of the closest emergency department if she worsens with the above plan.   Philis Fendt, MHS, PA-C [facility] 07/12/2016 2:41 PM

## 2016-07-17 ENCOUNTER — Other Ambulatory Visit: Payer: Self-pay | Admitting: Emergency Medicine

## 2016-07-17 DIAGNOSIS — Z1231 Encounter for screening mammogram for malignant neoplasm of breast: Secondary | ICD-10-CM

## 2016-07-18 ENCOUNTER — Encounter: Payer: Self-pay | Admitting: *Deleted

## 2016-08-12 ENCOUNTER — Telehealth: Payer: Self-pay

## 2016-08-12 NOTE — Telephone Encounter (Signed)
Pt has order for MM SCREENING BREAST TOMO BILATERAL via Dr. Everlene Farrier. [facility] called to sched, pt reported lump on breast and [facility] advised consulting with dr prior to scheduling. Can we follow up another provider and get her an apt with imaging if necessary? Thanks!

## 2016-08-15 NOTE — Telephone Encounter (Signed)
Left message to schedule f/u office visit with provider for lump on breast before scheduling mammogram

## 2016-09-10 ENCOUNTER — Telehealth: Payer: Self-pay

## 2016-09-10 NOTE — Telephone Encounter (Signed)
Pt was seen on 07-12-16 and had a valtrex prescription sent in.  We did not [facility] to [facility] [address] [facility] she had asked and the pharmacy that we did send it to never notified her that we had sent it there.  She now needs to get it. Can we send in a new script to [facility] [address]? [phone]

## 2016-09-12 MED ORDER — VALACYCLOVIR HCL 1 G PO TABS: 1000.0000 mg | ORAL_TABLET | Freq: Two times a day (BID) | ORAL | 0 refills | Status: DC

## 2016-09-12 NOTE — Telephone Encounter (Signed)
[facility] Pt aware.

## 2016-09-12 NOTE — Telephone Encounter (Signed)
Pt calling about Valtrex and wanting [facility] [address]

## 2016-11-28 ENCOUNTER — Encounter: Payer: Self-pay | Admitting: Family Medicine

## 2016-11-28 ENCOUNTER — Other Ambulatory Visit: Payer: Self-pay | Admitting: Family Medicine

## 2016-11-28 ENCOUNTER — Telehealth: Payer: Self-pay

## 2016-11-28 ENCOUNTER — Telehealth: Payer: Self-pay | Admitting: Family Medicine

## 2016-11-28 ENCOUNTER — Ambulatory Visit (INDEPENDENT_AMBULATORY_CARE_PROVIDER_SITE_OTHER): Payer: BC Managed Care – PPO | Admitting: Family Medicine

## 2016-11-28 VITALS — BP 118/78 | HR 70 | Resp 12 | Ht 66.0 in | Wt 133.5 lb

## 2016-11-28 DIAGNOSIS — Z20828 Contact with and (suspected) exposure to other viral communicable diseases: Secondary | ICD-10-CM

## 2016-11-28 DIAGNOSIS — E785 Hyperlipidemia, unspecified: Secondary | ICD-10-CM | POA: Insufficient documentation

## 2016-11-28 DIAGNOSIS — A6000 Herpesviral infection of urogenital system, unspecified: Secondary | ICD-10-CM | POA: Insufficient documentation

## 2016-11-28 DIAGNOSIS — F418 Other specified anxiety disorders: Secondary | ICD-10-CM

## 2016-11-28 DIAGNOSIS — R5382 Chronic fatigue, unspecified: Secondary | ICD-10-CM

## 2016-11-28 DIAGNOSIS — Z1231 Encounter for screening mammogram for malignant neoplasm of breast: Secondary | ICD-10-CM

## 2016-11-28 DIAGNOSIS — F329 Major depressive disorder, single episode, unspecified: Secondary | ICD-10-CM | POA: Insufficient documentation

## 2016-11-28 DIAGNOSIS — N6081 Other benign mammary dysplasias of right breast: Secondary | ICD-10-CM

## 2016-11-28 DIAGNOSIS — F419 Anxiety disorder, unspecified: Secondary | ICD-10-CM | POA: Insufficient documentation

## 2016-11-28 LAB — COMPREHENSIVE METABOLIC PANEL
ALK PHOS: 67 U/L (ref 39–117)
ALT: 12 U/L (ref 0–35)
AST: 17 U/L (ref 0–37)
Albumin: 5.1 g/dL (ref 3.5–5.2)
BILIRUBIN TOTAL: 0.5 mg/dL (ref 0.2–1.2)
BUN: 12 mg/dL (ref 6–23)
CO2: 27 meq/L (ref 19–32)
Calcium: 9.9 mg/dL (ref 8.4–10.5)
Chloride: 104 mEq/L (ref 96–112)
Creatinine, Ser: 0.76 mg/dL (ref 0.40–1.20)
GFR: 83.36 mL/min (ref >60.00)
GLUCOSE: 90 mg/dL (ref 70–99)
Potassium: 4 mEq/L (ref 3.5–5.1)
Sodium: 143 mEq/L (ref 135–145)
TOTAL PROTEIN: 7.7 g/dL (ref 6.0–8.3)

## 2016-11-28 LAB — LIPID PANEL
CHOL/HDL RATIO: 3
Cholesterol: 291 mg/dL — ABNORMAL HIGH (ref 0–200)
HDL: 84.3 mg/dL (ref >39.00)
LDL Cholesterol: 195 mg/dL — ABNORMAL HIGH (ref 0–99)
NONHDL: 206.63
Triglycerides: 59 mg/dL (ref 0.0–149.0)
VLDL: 11.8 mg/dL (ref 0.0–40.0)

## 2016-11-28 LAB — CBC
HEMATOCRIT: 41.2 % (ref 36.0–46.0)
Hemoglobin: 14.2 g/dL (ref 12.0–15.0)
MCHC: 34.4 g/dL (ref 30.0–36.0)
MCV: 91.9 fl (ref 78.0–100.0)
Platelets: 289 10*3/uL (ref 150.0–400.0)
RBC: 4.49 Mil/uL (ref 3.87–5.11)
RDW: 13.4 % (ref 11.5–15.5)
WBC: 7.8 10*3/uL (ref 4.0–10.5)

## 2016-11-28 LAB — VITAMIN D 25 HYDROXY (VIT D DEFICIENCY, FRACTURES): VITD: 44.06 ng/mL (ref 30.00–100.00)

## 2016-11-28 LAB — TSH: TSH: 3.73 u[IU]/mL (ref 0.35–4.50)

## 2016-11-28 MED ORDER — OSELTAMIVIR PHOSPHATE 75 MG PO CAPS: 75.0000 mg | ORAL_CAPSULE | Freq: Every day | ORAL | 0 refills | Status: AC

## 2016-11-28 NOTE — Progress Notes (Signed)
HPI:   [name] is a 56 y.o. female, who is here today to establish care with me.  Former PCP: [facility]  Last preventive routine visit: 2015  Chronic medical problems: Recurrent genital herpes, anxiety,depression, and HLD.    Concerns today: She would like "everything checked." She has followed with "funtional doctor", last visit 2015. Provider usually orders lab work and she would like same labs. She brought a copy or prior results and otherwise normal, except for mildly elevated cholesterol (CBC,TSH,thyroid abs, T3,T4,CMP,25 OH vit D, FLP among some). She is concerned about her risk of having a CV event.   Reporting Hx of hypothyroidism, TSH has been in normal range by lab ranges but functional provider would like less than 3. She took "natural" medication. She denies diarrhea, or constipation, cold/heat intolerance, abdominal wt loss.  "Place on chest" Nodular lesion closed to right breast, noted 5 years ago. She follows annually with dermatologists,lesion was already evaluated. She did not have it remove because was not sure about the cost. She denies any pain, growing slowly, she has not noted erythema. She would like it remove because it "smells."     + Fatigue. Chronic and stable. She has Hx of depression and anxiety. She has been on Valum as needed, last taken 1-2 years. FHx: Brother died from opioid overdose, first husband committed suicide. She drinks alcohol daily, beer and wine, > 8 oz daily. She lives with husband.  Was on Lexapro before but she felt like she can try with natural medicine   -She has Hx of recurrent genital herpes for many years, episodes are not as frequent as they used to be. She takes valtrex as needed.  -She is also concerned about exposure to influenza. 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 Yesterday she visited one of the residents and she was diagnosed with  influenza as well. She has not noted symptoms so far.She has not received vaccination and she is not interested in doing so.   Review of Systems  Constitutional: Positive for fatigue. Negative for activity change, appetite change, fever and unexpected weight change.  HENT: Negative for mouth sores, nosebleeds, trouble swallowing and voice change.   Eyes: Negative for redness and visual disturbance.  Respiratory: Negative for cough, shortness of breath and wheezing.   Cardiovascular: Negative for chest pain and leg swelling.  Gastrointestinal: Negative for abdominal pain, nausea and vomiting.       Negative for changes in bowel habits.  Endocrine: Negative for cold intolerance, heat intolerance, polydipsia, polyphagia and polyuria.  Genitourinary: Negative for decreased urine volume, difficulty urinating and hematuria.  Musculoskeletal: Negative for gait problem and myalgias.  Skin: Negative for rash.  Neurological: Negative for syncope, weakness and headaches.  Psychiatric/Behavioral: Positive for sleep disturbance. Negative for confusion and suicidal ideas. The patient is nervous/anxious.       Current Outpatient Prescriptions on File Prior to Visit  Medication Sig Dispense Refill  . Ascorbic Acid (VITAMIN C ER PO) Take by mouth.    . Cholecalciferol (VITAMIN D PO) Take by mouth.    Marland Kitchen GARLIC PO Take by mouth.     No current facility-administered medications on file prior to visit.      Past Medical History:  Diagnosis Date  . Cancer ([place])    skin  . Hypothyroidism    Allergies  Allergen Reactions  . Latex     Family History  Problem  Relation Age of Onset  . Colon polyps Mother   . Colitis Mother   . Arthritis Mother   . Hyperlipidemia Mother   . Diabetes Mother   . Heart disease Father   . Hyperlipidemia Father   . Hypertension Father   . Colon cancer Paternal Aunt   . Diabetes Maternal Grandmother     Social History   Social History  . Marital status:  Single    Spouse name: N/A  . Number of children: N/A  . Years of education: N/A   Social History Main Topics  . Smoking status: Never Smoker  . Smokeless tobacco: Never Used  . Alcohol use 0.0 oz/week    1 - 3 Glasses of wine per week  . Drug use: No  . Sexual activity: Not Asked   Other Topics Concern  . None   Social History Narrative  . None    Vitals:   11/28/16 0754  BP: 118/78  Pulse: 70  Resp: 12  O2 sat 98% at RA.  Body mass index is 21.55 kg/m.   Physical Exam  Nursing note and vitals reviewed. Constitutional: She is oriented to person, place, and time. She appears well-developed and well-nourished. No distress.  HENT:  Head: Atraumatic.  Mouth/Throat: Oropharynx is clear and moist and mucous membranes are normal.  Eyes: Conjunctivae are normal. Pupils are equal, round, and reactive to light. Left eye exhibits normal extraocular motion.  Right exophoria.   Neck: No tracheal deviation present. No thyroid mass and no thyromegaly present.  Cardiovascular: Normal rate and regular rhythm.   No murmur heard. Pulses:      Dorsalis pedis pulses are 2+ on the right side, and 2+ on the left side.  Respiratory: Effort normal and breath sounds normal. No respiratory distress.  GI: Soft. She exhibits no mass. There is no hepatomegaly. There is no tenderness.  Musculoskeletal: She exhibits no edema.  Lymphadenopathy:    She has no cervical adenopathy.  Neurological: She is alert and oriented to person, place, and time. She has normal strength. Coordination and gait normal.  Skin: Skin is warm. No rash noted. No erythema.     2-2.5 cm,nodular,soft lesion medial to right breast.No tender,no erythema, mobile.  Psychiatric: Her mood appears anxious. Her affect is labile. She expresses no suicidal ideation.  Well groomed, good eye contact.      ASSESSMENT AND PLAN:    Jean Morgan was seen today for establish care.  Diagnoses and all orders for this visit:   Lab  Results  Component Value Date   TSH 3.73 11/28/2016   Lab Results  Component Value Date   CHOL 291 (H) 11/28/2016   HDL 84.30 11/28/2016   LDLCALC 195 (H) 11/28/2016   TRIG 59.0 11/28/2016   CHOLHDL 3 11/28/2016     Chemistry      Component Value Date/Time   NA 143 11/28/2016 0902   K 4.0 11/28/2016 0902   CL 104 11/28/2016 0902   CO2 27 11/28/2016 0902   BUN 12 11/28/2016 0902   CREATININE 0.76 11/28/2016 0902   CREATININE 0.68 09/01/2014 1057      Component Value Date/Time   CALCIUM 9.9 11/28/2016 0902   ALKPHOS 67 11/28/2016 0902   AST 17 11/28/2016 0902   ALT 12 11/28/2016 0902   BILITOT 0.5 11/28/2016 0902      Chronic fatigue  We discussed possible etiologies. Some blood work done today but she has had some work-up done and otherwise negative. She has  Hx of depression and anxiety, these 2 certainly can be contributing factors.   -     TSH -     CBC -     VITAMIN D 25 Hydroxy (Vit-D Deficiency, Fractures)  Hyperlipidemia, unspecified hyperlipidemia type  Continue low fat diet, she is not interested in taking statins. Further recommendations will be given according to lab results. In regard to CV risk we discussed primary prevention, current recommendations in regard to screening, and Aspirin for primary prevention. Decrease alcohol intake also recommended, up to 4 oz of alcohol is recommended for women, beer does not have major benefit.  F/U in 6-12 months.  -     Lipid panel -     Comprehensive metabolic panel  Anxiety and depression  She is not interested in pharmacologic treatment. Psychotherapy recommended, she states that she tried before and did not help.  Exposure to influenza  Prophylactic treatment recommended. She refused vaccination.  -     oseltamivir (TAMIFLU) 75 MG capsule; Take 1 capsule (75 mg total) by mouth daily.  Sebaceous cyst of skin of right breast  She already followed with dermatologists and recommendations in regard to  treatment were given. Explained that it is a benign lesion, if not causing symptoms she could monitor, she feels like lesions "stinks."She will follow with dermatology if she decides to have procedure done.   Recurrent genital herpes  Stable. Requesting refills.  No changes in current management. F/U in 12 months.  -     valACYclovir (VALTREX) 1000 MG tablet; Start ASAP after symptoms onset and within 48 hours:1 tab daily for 5 days prn.        [name]. [name], MD  [facility]. [place] office.

## 2016-11-28 NOTE — Telephone Encounter (Signed)
Pt seen today and forgot to ask Dr [name] if she thinks it is a good idea to do the "life line screening".  Pt had information, it involves several test like carotid artery with ultrasound, peripheral artery screening, using ultrasound and BP readings.  Pt realizes this would be something she would have to pay for out of pocket, but wants to know if a waste of time or good idea?Marland Kitchen

## 2016-11-28 NOTE — Progress Notes (Signed)
Pre visit review using our clinic review tool, if applicable. No additional management support is needed unless otherwise documented below in the visit note. 

## 2016-11-28 NOTE — Patient Instructions (Addendum)
A few things to remember from today's visit:   Exposure to influenza - Plan: oseltamivir (TAMIFLU) 75 MG capsule  Hyperlipidemia, unspecified hyperlipidemia type - Plan: Lipid panel, Comprehensive metabolic panel  Anxiety and depression  Chronic fatigue - Plan: TSH, CBC, VITAMIN D 25 Hydroxy (Vit-D Deficiency, Fractures)  Please schedule preventive visit.   Please be sure medication list is accurate. If a new problem present, please set up appointment sooner than planned today.

## 2016-11-28 NOTE — Telephone Encounter (Signed)
Patient requesting refill on Valtrex.  Refill okay?

## 2016-11-30 ENCOUNTER — Encounter: Payer: Self-pay | Admitting: Family Medicine

## 2016-11-30 MED ORDER — VALACYCLOVIR HCL 1 G PO TABS: ORAL_TABLET | ORAL | 2 refills | Status: DC

## 2016-12-01 NOTE — Telephone Encounter (Signed)
Called and spoke with patient. Relayed message below and patient verbalized understanding.

## 2016-12-01 NOTE — Telephone Encounter (Signed)
Rx was sent to her pharmacy.  Thanks, BJ 

## 2016-12-01 NOTE — Telephone Encounter (Signed)
In general these type of tests are not recommended for screening purpuses.  BP readings she can do at home. I usually check pulses during OV's.  Thanks, BJ

## 2016-12-02 ENCOUNTER — Ambulatory Visit
Admission: RE | Admit: 2016-12-02 | Discharge: 2016-12-02 | Disposition: A | Discharge status: Home / Self Care | Payer: BC Managed Care – PPO | Source: Ambulatory Visit | Attending: Family Medicine | Admitting: Family Medicine

## 2016-12-02 DIAGNOSIS — Z1231 Encounter for screening mammogram for malignant neoplasm of breast: Secondary | ICD-10-CM

## 2016-12-03 ENCOUNTER — Other Ambulatory Visit: Payer: Self-pay | Admitting: Family Medicine

## 2016-12-03 DIAGNOSIS — R928 Other abnormal and inconclusive findings on diagnostic imaging of breast: Secondary | ICD-10-CM

## 2016-12-09 ENCOUNTER — Ambulatory Visit
Admission: RE | Admit: 2016-12-09 | Discharge: 2016-12-09 | Disposition: A | Discharge status: Home / Self Care | Payer: BC Managed Care – PPO | Source: Ambulatory Visit | Attending: Family Medicine | Admitting: Family Medicine

## 2016-12-09 DIAGNOSIS — R928 Other abnormal and inconclusive findings on diagnostic imaging of breast: Secondary | ICD-10-CM

## 2017-03-31 NOTE — Progress Notes (Signed)
HPI:   [name] is a 56 y.o. female, who is here today for her routine physical.  She established care on 11/28/16. Last CPE in 2015.  Regular exercise 3 or more time per week: No Following a healthy diet: Yes. She lives with husband.  Chronic medical problems: Chronic fatigue, depression, recurrent genital herpes, HLD.  She follows with dermatologists annually, Dr Jean Morgan. Last eye exam: 2018.  Pap smear 05/2013 Hx of abnormal pap smears: None in the past 10 years. Hx of STD's: Genital herpes.  Sexually active. G:1 L:1  M:15 LMP: earlier 20's.  Still drinks daily wine > 2 glasses, she doe snot specified, also has been drinking beer lately.  Mammogram: 11/2016 [facility] 1 Colonoscopy: 09/2014.   Hep C screening: Denies risk factors.  She has no new concerns today.  She has a sebaceous cysts on right breast, already evaluated by her dermatologists. She would like me to remove sebaceous cyst. We have addressed this last visit, she tells me that her dermatologists charges about $500 and was hoping that it would be cheaper here.  HDL: She refused pharmacologic treatment. She has tried to follow a healthy diet, low fat diet.  She also would like another refill for Valtrex. She has Hx of genital herpes, 3-4 episodes per year.  Also concerned about worsening hearing loss. She is not sure which ear, she has had hearing test years ago and was told she had mild hearing loss. She denies earache.   Review of Systems  Constitutional: Positive for fatigue. Negative for appetite change, fever and unexpected weight change.  HENT: Positive for hearing loss. Negative for dental problem, mouth sores, nosebleeds, sore throat, trouble swallowing and voice change.   Eyes: Negative for redness and visual disturbance.  Respiratory: Negative for cough, shortness of breath and wheezing.   Cardiovascular: Negative for chest pain and leg swelling.  Gastrointestinal: Negative for  abdominal pain, blood in stool, nausea and vomiting.       No changes in bowel habits.  Endocrine: Negative for cold intolerance, heat intolerance, polydipsia, polyphagia and polyuria.  Genitourinary: Negative for decreased urine volume, dysuria, hematuria, menstrual problem, vaginal bleeding and vaginal discharge.       No breast tenderness or nipple discharge.  Musculoskeletal: Positive for arthralgias. Negative for gait problem and myalgias.  Skin: Negative for color change and rash.  Allergic/Immunologic: Positive for environmental allergies.  Neurological: Negative for syncope, weakness and headaches.  Hematological: Negative for adenopathy. Does not bruise/bleed easily.  Psychiatric/Behavioral: Positive for sleep disturbance. Negative for confusion and suicidal ideas. The patient is nervous/anxious.   All other systems reviewed and are negative.     Current Outpatient Prescriptions on File Prior to Visit  Medication Sig Dispense Refill  . Ascorbic Acid (VITAMIN C ER PO) Take by mouth.    . Cholecalciferol (VITAMIN D PO) Take by mouth.    Marland Kitchen GARLIC PO Take by mouth.     No current facility-administered medications on file prior to visit.      Past Medical History:  Diagnosis Date  . Cancer ([place])    skin  . Hypothyroidism     Allergies  Allergen Reactions  . Latex   . Oxycodone Other (See Comments)    Other Reaction: "too strong a drug for me"   Past Surgical History:  Procedure Laterality Date  . DENTAL SURGERY    . TONSILLECTOMY       Family History  Problem Relation Age of Onset  .  Colon polyps Mother   . Colitis Mother   . Arthritis Mother   . Hyperlipidemia Mother   . Diabetes Mother   . Heart disease Father   . Hyperlipidemia Father   . Hypertension Father   . Colon cancer Paternal Aunt   . Diabetes Maternal Grandmother   . Breast cancer Maternal Aunt     Social History   Social History  . Marital status: Single    Spouse name: N/A  . Number  of children: N/A  . Years of education: N/A   Social History Main Topics  . Smoking status: Never Smoker  . Smokeless tobacco: Never Used  . Alcohol use 0.0 oz/week    1 - 3 Glasses of wine per week  . Drug use: No  . Sexual activity: Not Asked   Other Topics Concern  . None   Social History Narrative  . None     Vitals:   04/01/17 0755  BP: 116/80  Pulse: 76  Resp: 12  Temp: 98.1 F (36.7 C)   Body mass index is 22.17 kg/m.  O2 sat at RA 98%  Wt Readings from Last 3 Encounters:  04/01/17 137 lb 6 oz (62.3 kg)  11/28/16 133 lb 8 oz (60.6 kg)  07/12/16 133 lb (60.3 kg)    Physical Exam  Nursing note and vitals reviewed. Constitutional: She is oriented to person, place, and time. She appears well-developed. No distress.  HENT:  Head: Atraumatic.  Right Ear: Hearing, tympanic membrane, external ear and ear canal normal.  Left Ear: Hearing, tympanic membrane, external ear and ear canal normal.  Mouth/Throat: Uvula is midline, oropharynx is clear and moist and mucous membranes are normal.  Eyes: Conjunctivae and EOM are normal. Pupils are equal, round, and reactive to light.  Right exophoria noted intermittently.  Neck: No tracheal deviation present. No thyromegaly present.  Cardiovascular: Normal rate and regular rhythm.   No murmur heard. Pulses:      Dorsalis pedis pulses are 2+ on the right side, and 2+ on the left side.  Respiratory: Effort normal and breath sounds normal. No respiratory distress.  GI: Soft. She exhibits no mass. There is no hepatomegaly. There is no tenderness.  Genitourinary: Uterus normal. There is no tenderness on the right labia. There is no tenderness on the left labia. Cervix exhibits no motion tenderness, no discharge and no friability. Right adnexum displays no mass and no tenderness. Left adnexum displays no mass and no tenderness. No erythema, tenderness or bleeding in the vagina. No vaginal discharge found.  Genitourinary Comments:  Breast: Mild fibrocystic changes bilateral,mainly outer upper quadrants, no masses or nipple discharge. Vaginal atrophic changes. Pap smear collected.  Musculoskeletal: She exhibits no edema or tenderness.  No major deformity or signs of synovitis appreciated.  Lymphadenopathy:    She has no cervical adenopathy.    She has no axillary adenopathy.       Right: No inguinal and no supraclavicular adenopathy present.       Left: No inguinal and no supraclavicular adenopathy present.  Neurological: She is alert and oriented to person, place, and time. She has normal strength. No cranial nerve deficit. Coordination and gait normal.  Reflex Scores:      Bicep reflexes are 2+ on the right side and 2+ on the left side.      Patellar reflexes are 2+ on the right side and 2+ on the left side. Skin: Skin is warm. No rash noted. No erythema.  Nodular lesion  medial aspect of right breast (between sternum and breast), not erythematous, no tender, about 2.5 cm.  Psychiatric: Her mood appears anxious. Cognition and memory are normal.  Fairly groomed, good eye contact.    ASSESSMENT AND PLAN:   Jean Morgan was seen today for annual exam and gynecologic exam.  Diagnoses and all orders for this visit:  Lab Results  Component Value Date   CHOL 300 (H) 04/01/2017   HDL 80.10 04/01/2017   LDLCALC 199 (H) 04/01/2017   TRIG 102.0 04/01/2017   CHOLHDL 4 04/01/2017    Routine general medical examination at a health care facility   We discussed the importance of regular physical activity and healthy diet for prevention of chronic illness and/or complications. Preventive guidelines reviewed. Vaccination up to date. Recommendations in regard to alcohol intake discussed. Aspirin for primary prevention recommended. Ca++ and vit D supplementation recommended. Next CPE in 1 year.  -     PAP [[place]]  Hyperlipidemia, unspecified hyperlipidemia type  She is not interested in pharmacologic treatment,  still would like to have FLP done today. Low fat diet to continue. Benefits of statin discussed.  -     Lipid panel  Encounter for HCV screening test for high risk patient -     Hepatitis C antibody screen  Recurrent genital herpes -     valACYclovir (VALTREX) 1000 MG tablet; Start ASAP after symptoms onset and within 48 hours:1 tab daily for 5 days prn.  Hearing loss, unspecified hearing loss type, unspecified laterality  Screening audiometry abnormal. Audiology referral placed.  -     Ambulatory referral to Audiology    Yashira Offenberger G. [name], MD  [facility]. [place] office.

## 2017-04-01 ENCOUNTER — Other Ambulatory Visit (HOSPITAL_COMMUNITY)
Admission: RE | Admit: 2017-04-01 | Discharge: 2017-04-01 | Disposition: A | Discharge status: Home / Self Care | Payer: BC Managed Care – PPO | Source: Ambulatory Visit | Attending: Family Medicine | Admitting: Family Medicine

## 2017-04-01 ENCOUNTER — Encounter: Payer: Self-pay | Admitting: Family Medicine

## 2017-04-01 ENCOUNTER — Ambulatory Visit (INDEPENDENT_AMBULATORY_CARE_PROVIDER_SITE_OTHER): Payer: BC Managed Care – PPO | Admitting: Family Medicine

## 2017-04-01 VITALS — BP 116/80 | HR 76 | Temp 98.1°F | Resp 12 | Ht 66.0 in | Wt 137.4 lb

## 2017-04-01 DIAGNOSIS — Z0000 Encounter for general adult medical examination without abnormal findings: Secondary | ICD-10-CM | POA: Diagnosis not present

## 2017-04-01 DIAGNOSIS — H9190 Unspecified hearing loss, unspecified ear: Secondary | ICD-10-CM | POA: Diagnosis not present

## 2017-04-01 DIAGNOSIS — A6000 Herpesviral infection of urogenital system, unspecified: Secondary | ICD-10-CM

## 2017-04-01 DIAGNOSIS — E785 Hyperlipidemia, unspecified: Secondary | ICD-10-CM

## 2017-04-01 DIAGNOSIS — Z1159 Encounter for screening for other viral diseases: Secondary | ICD-10-CM

## 2017-04-01 DIAGNOSIS — Z9189 Other specified personal risk factors, not elsewhere classified: Secondary | ICD-10-CM

## 2017-04-01 LAB — LIPID PANEL
Cholesterol: 300 mg/dL — ABNORMAL HIGH (ref 0–200)
HDL: 80.1 mg/dL (ref >39.00)
LDL Cholesterol: 199 mg/dL — ABNORMAL HIGH (ref 0–99)
NonHDL: 219.46
Total CHOL/HDL Ratio: 4
Triglycerides: 102 mg/dL (ref 0.0–149.0)
VLDL: 20.4 mg/dL (ref 0.0–40.0)

## 2017-04-01 MED ORDER — VALACYCLOVIR HCL 1 G PO TABS: ORAL_TABLET | ORAL | 2 refills | Status: DC

## 2017-04-01 NOTE — Patient Instructions (Signed)
A few things to remember from today's visit:   Hyperlipidemia, unspecified hyperlipidemia type - Plan: Lipid panel  Encounter for HCV screening test for high risk patient - Plan: Hepatitis C antibody screen  Recurrent genital herpes - Plan: valACYclovir (VALTREX) 1000 MG tablet  Routine general medical examination at a health care facility - Plan: Cytology - PAP ([place])    At least 150 minutes of moderate exercise per week, daily brisk walking for 15-30 min is a good exercise option. Healthy diet low in saturated (animal) fats and sweets and consisting of fresh fruits and vegetables, lean meats such as fish and white chicken and whole grains.   - Vaccines:  Tdap vaccine every 10 years.  Shingles vaccine recommended at age 40, could be given after 57 years of age but not sure about insurance coverage.  Pneumonia vaccines:  Prevnar 13 at 65 and Pneumovax at 26.  Screening recommendations for low/normal risk women:  Screening for diabetes at age 36-45 and every 3 years.  Cervical cancer prevention:  -HPV vaccination between 4-24 years old. -Pap smear starts at 57 years of age and continues periodically until 57 years old in low risk women. Pap smear every 3 years between 75 and 23 years old. Pap smear every 3 years between women 61 and older if pap smear negative and HPV screening negative.   -Breast cancer: Mammogram: There is disagreement between experts about when to start screening in low risk asymptomatic female but recent recommendations are to start screening at 87 and not later than 57 years old , every 1-2 years and after 57 yo q 2 years. Screening is recommended until 57 years old but some women can continue screening depending of healthy issues.   Colon cancer screening: starts at 57 years old until 56 years old.  Cholesterol disorder screening at age 2 and every 3 years.  Also recommended:  1. Dental visit- Brush and floss your teeth twice daily; visit your  dentist twice a year. 2. Eye doctor- Get an eye exam at least every 2 years. 3. Helmet use- Always wear a helmet when riding a bicycle, motorcycle, rollerblading or skateboarding. 4. Safe sex- If you may be exposed to sexually transmitted infections, use a condom. 5. Seat belts- Seat belts can save your live; always wear one. 6. Smoke/Carbon Monoxide detectors- These detectors need to be installed on the appropriate level of your home. Replace batteries at least once a year. 7. Skin cancer- When out in the sun please cover up and use sunscreen 15 SPF or higher. 8. Violence- If anyone is threatening or hurting you, please tell your healthcare provider.  9. Drink alcohol in moderation- Limit alcohol intake to one drink or less per day:3-4 oz daily max. Never drink and drive.  Please be sure medication list is accurate. If a new problem present, please set up appointment sooner than planned today.

## 2017-04-02 LAB — HEPATITIS C ANTIBODY: HCV AB: NEGATIVE

## 2017-04-03 LAB — CYTOLOGY - PAP
DIAGNOSIS: NEGATIVE
HPV (WINDOPATH): NOT DETECTED

## 2017-04-04 ENCOUNTER — Encounter: Payer: Self-pay | Admitting: Family Medicine

## 2017-07-16 ENCOUNTER — Encounter: Payer: Self-pay | Admitting: Family Medicine

## 2017-08-05 ENCOUNTER — Ambulatory Visit: Payer: BC Managed Care – PPO | Admitting: Audiology

## 2017-09-30 ENCOUNTER — Ambulatory Visit: Payer: BC Managed Care – PPO | Admitting: Family Medicine

## 2017-11-25 ENCOUNTER — Other Ambulatory Visit: Payer: Self-pay | Admitting: *Deleted

## 2017-11-25 DIAGNOSIS — A6000 Herpesviral infection of urogenital system, unspecified: Secondary | ICD-10-CM

## 2017-11-25 MED ORDER — VALACYCLOVIR HCL 1 G PO TABS: ORAL_TABLET | ORAL | 2 refills | Status: DC

## 2018-02-10 ENCOUNTER — Encounter: Payer: Self-pay | Admitting: Family Medicine

## 2018-02-10 ENCOUNTER — Ambulatory Visit: Payer: BC Managed Care – PPO | Admitting: Family Medicine

## 2018-02-10 VITALS — BP 160/96 | HR 95 | Temp 98.2°F | Ht 66.0 in | Wt 134.0 lb

## 2018-02-10 DIAGNOSIS — A6000 Herpesviral infection of urogenital system, unspecified: Secondary | ICD-10-CM | POA: Diagnosis not present

## 2018-02-10 DIAGNOSIS — J209 Acute bronchitis, unspecified: Secondary | ICD-10-CM

## 2018-02-10 MED ORDER — VALACYCLOVIR HCL 1 G PO TABS: ORAL_TABLET | ORAL | 0 refills | Status: DC

## 2018-02-10 MED ORDER — AZITHROMYCIN 250 MG PO TABS: ORAL_TABLET | ORAL | 0 refills | Status: DC

## 2018-02-10 MED ORDER — HYDROCODONE-HOMATROPINE 5-1.5 MG/5ML PO SYRP: 5.0000 mL | ORAL_SOLUTION | ORAL | 0 refills | Status: DC | PRN

## 2018-02-10 NOTE — Progress Notes (Signed)
   Subjective:    Patient ID: Jean Morgan, female    DOB: 1961/01/24, 57 y.o.   MRN: [id number]  HPI Here for 2 weeks of chest congestion and coughing up green sputum. No fever or SOB. She has tried acupuncture, [place] herbs, and Zyrtec with no response.    Review of Systems  Constitutional: Negative.   HENT: Positive for congestion and postnasal drip. Negative for sinus pressure, sinus pain and sore throat.   Eyes: Negative.   Respiratory: Positive for cough and chest tightness. Negative for shortness of breath and wheezing.        Objective:   Physical Exam  Constitutional: She appears well-developed and well-nourished.  HENT:  Right Ear: External ear normal.  Left Ear: External ear normal.  Nose: Nose normal.  Mouth/Throat: Oropharynx is clear and moist.  Eyes: Conjunctivae are normal.  Neck: No thyromegaly present.  Pulmonary/Chest: Effort normal. No respiratory distress. She has no rales.  Scattered wheezes and rhonchi   Lymphadenopathy:    She has no cervical adenopathy.          Assessment & Plan:  Bronchitis, treat with a Zpack.  Alysia Penna, MD

## 2018-08-18 ENCOUNTER — Encounter: Payer: Self-pay | Admitting: Family Medicine

## 2018-08-18 ENCOUNTER — Ambulatory Visit (INDEPENDENT_AMBULATORY_CARE_PROVIDER_SITE_OTHER): Payer: BC Managed Care – PPO | Admitting: Family Medicine

## 2018-08-18 VITALS — BP 120/76 | HR 79 | Temp 98.1°F | Resp 12 | Ht 66.0 in | Wt 140.5 lb

## 2018-08-18 DIAGNOSIS — Z131 Encounter for screening for diabetes mellitus: Secondary | ICD-10-CM | POA: Diagnosis not present

## 2018-08-18 DIAGNOSIS — A6000 Herpesviral infection of urogenital system, unspecified: Secondary | ICD-10-CM | POA: Diagnosis not present

## 2018-08-18 DIAGNOSIS — R7989 Other specified abnormal findings of blood chemistry: Secondary | ICD-10-CM

## 2018-08-18 DIAGNOSIS — E785 Hyperlipidemia, unspecified: Secondary | ICD-10-CM | POA: Diagnosis not present

## 2018-08-18 DIAGNOSIS — H9191 Unspecified hearing loss, right ear: Secondary | ICD-10-CM | POA: Diagnosis not present

## 2018-08-18 DIAGNOSIS — H9190 Unspecified hearing loss, unspecified ear: Secondary | ICD-10-CM | POA: Insufficient documentation

## 2018-08-18 DIAGNOSIS — R29898 Other symptoms and signs involving the musculoskeletal system: Secondary | ICD-10-CM

## 2018-08-18 DIAGNOSIS — Z0000 Encounter for general adult medical examination without abnormal findings: Secondary | ICD-10-CM

## 2018-08-18 LAB — COMPREHENSIVE METABOLIC PANEL
ALT: 14 U/L (ref 0–35)
AST: 17 U/L (ref 0–37)
Albumin: 4.9 g/dL (ref 3.5–5.2)
Alkaline Phosphatase: 79 U/L (ref 39–117)
BUN: 10 mg/dL (ref 6–23)
CHLORIDE: 102 meq/L (ref 96–112)
CO2: 27 mEq/L (ref 19–32)
Calcium: 9.7 mg/dL (ref 8.4–10.5)
Creatinine, Ser: 0.75 mg/dL (ref 0.40–1.20)
GFR: 84.14 mL/min (ref >60.00)
Glucose, Bld: 98 mg/dL (ref 70–99)
Potassium: 3.6 mEq/L (ref 3.5–5.1)
SODIUM: 140 meq/L (ref 135–145)
TOTAL PROTEIN: 7.7 g/dL (ref 6.0–8.3)
Total Bilirubin: 0.7 mg/dL (ref 0.2–1.2)

## 2018-08-18 LAB — CBC
HEMATOCRIT: 39.8 % (ref 36.0–46.0)
Hemoglobin: 13.5 g/dL (ref 12.0–15.0)
MCHC: 34 g/dL (ref 30.0–36.0)
MCV: 91.5 fl (ref 78.0–100.0)
Platelets: 272 10*3/uL (ref 150.0–400.0)
RBC: 4.35 Mil/uL (ref 3.87–5.11)
RDW: 13.2 % (ref 11.5–15.5)
WBC: 8.4 10*3/uL (ref 4.0–10.5)

## 2018-08-18 LAB — LIPID PANEL
CHOLESTEROL: 265 mg/dL — AB (ref 0–200)
HDL: 69.3 mg/dL (ref >39.00)
LDL CALC: 179 mg/dL — AB (ref 0–99)
NonHDL: 196
TRIGLYCERIDES: 84 mg/dL (ref 0.0–149.0)
Total CHOL/HDL Ratio: 4
VLDL: 16.8 mg/dL (ref 0.0–40.0)

## 2018-08-18 LAB — VITAMIN B12

## 2018-08-18 LAB — TSH: TSH: 4.69 u[IU]/mL — AB (ref 0.35–4.50)

## 2018-08-18 NOTE — Assessment & Plan Note (Signed)
We discussed adverse effects of hyperlipidemia as well as benefits of statin medication. She is not interested in pharmacologic treatment for now. Further recommendations will be given according to lipid panel results.

## 2018-08-18 NOTE — Patient Instructions (Addendum)
A few things to remember from today's visit:   Routine general medical examination at a health care facility  Hearing loss of right ear, unspecified hearing loss type - Plan: Ambulatory referral to ENT  Hyperlipidemia, unspecified hyperlipidemia type - Plan: Lipid panel  Recurrent genital herpes  Weakness of both upper extremities - Plan: TSH, Ambulatory referral to Neurology, CBC, Vitamin B12  Diabetes mellitus screening - Plan: Comprehensive metabolic panel  Today you have you routine preventive visit as well as discuss some concerns.  At least 150 minutes of moderate exercise per week, daily brisk walking for 15-30 min is a good exercise option. Healthy diet low in saturated (animal) fats and sweets and consisting of fresh fruits and vegetables, lean meats such as fish and white chicken and whole grains.  These are some of recommendations for screening depending of age and risk factors:   - Vaccines:  Tdap vaccine every 10 years.  Shingles vaccine recommended at age 30, could be given after 58 years of age but not sure about insurance coverage.   Pneumonia vaccines:  Prevnar 13 at 65 and Pneumovax at 57. Sometimes Pneumovax is giving earlier if history of smoking, lung disease,diabetes,kidney disease among some.    Screening for diabetes at age 45 and every 3 years.  Cervical cancer prevention:  Pap smear starts at 58 years of age and continues periodically until 58 years old in low risk women. Pap smear every 3 years between 48 and 39 years old. Pap smear every 3-5 years between women 4 and older if pap smear negative and HPV screening negative.   -Breast cancer: Mammogram: There is disagreement between experts about when to start screening in low risk asymptomatic female but recent recommendations are to start screening at 62 and not later than 58 years old , every 1-2 years and after 58 yo q 2 years. Screening is recommended until 58 years old but some women can  continue screening depending of healthy issues.   Colon cancer screening: starts at 58 years old until 58 years old.   Also recommended:  1. Dental visit- Brush and floss your teeth twice daily; visit your dentist twice a year. 2. Eye doctor- Get an eye exam at least every 2 years. 3. Helmet use- Always wear a helmet when riding a bicycle, motorcycle, rollerblading or skateboarding. 4. Safe sex- If you may be exposed to sexually transmitted infections, use a condom. 5. Seat belts- Seat belts can save your live; always wear one. 6. Smoke/Carbon Monoxide detectors- These detectors need to be installed on the appropriate level of your home. Replace batteries at least once a year. 7. Skin cancer- When out in the sun please cover up and use sunscreen 15 SPF or higher. 8. Violence- If anyone is threatening or hurting you, please tell your healthcare provider.  9. Drink alcohol in moderation- Limit alcohol intake to one drink or less per day. Never drink and drive.  Please be sure medication list is accurate. If a new problem present, please set up appointment sooner than planned today.
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
## 2018-08-18 NOTE — Assessment & Plan Note (Signed)
Right sided, chronic. Today gross hearing was otherwise normal. ENT appointment will be arranged.

## 2018-08-18 NOTE — Assessment & Plan Note (Signed)
It has not been as frequent this year. She still has some refills left. She will call for refills if needed. Follow-up in a year.

## 2018-08-22 ENCOUNTER — Encounter: Payer: Self-pay | Admitting: Family Medicine

## 2018-08-25 ENCOUNTER — Encounter: Payer: Self-pay | Admitting: Neurology

## 2018-11-12 ENCOUNTER — Ambulatory Visit: Payer: BC Managed Care – PPO | Admitting: Neurology

## 2019-01-07 ENCOUNTER — Other Ambulatory Visit (INDEPENDENT_AMBULATORY_CARE_PROVIDER_SITE_OTHER): Payer: BC Managed Care – PPO

## 2019-01-07 ENCOUNTER — Other Ambulatory Visit: Payer: Self-pay

## 2019-01-07 DIAGNOSIS — R7989 Other specified abnormal findings of blood chemistry: Secondary | ICD-10-CM

## 2019-01-08 LAB — TSH: TSH: 3.55 m[IU]/L (ref 0.40–4.50)

## 2019-01-10 ENCOUNTER — Encounter: Payer: Self-pay | Admitting: Family Medicine

## 2019-04-14 ENCOUNTER — Telehealth: Payer: Self-pay | Admitting: *Deleted

## 2019-04-14 ENCOUNTER — Ambulatory Visit: Payer: Self-pay

## 2019-04-14 ENCOUNTER — Other Ambulatory Visit: Payer: Self-pay

## 2019-04-14 ENCOUNTER — Other Ambulatory Visit: Payer: BC Managed Care – PPO

## 2019-04-14 ENCOUNTER — Ambulatory Visit (INDEPENDENT_AMBULATORY_CARE_PROVIDER_SITE_OTHER): Payer: BC Managed Care – PPO | Admitting: Family Medicine

## 2019-04-14 ENCOUNTER — Encounter: Payer: Self-pay | Admitting: Family Medicine

## 2019-04-14 VITALS — Temp 98.6°F

## 2019-04-14 DIAGNOSIS — Z20822 Contact with and (suspected) exposure to covid-19: Secondary | ICD-10-CM

## 2019-04-14 DIAGNOSIS — R05 Cough: Secondary | ICD-10-CM | POA: Diagnosis not present

## 2019-04-14 DIAGNOSIS — R059 Cough, unspecified: Secondary | ICD-10-CM

## 2019-04-14 DIAGNOSIS — Z20828 Contact with and (suspected) exposure to other viral communicable diseases: Secondary | ICD-10-CM

## 2019-04-14 DIAGNOSIS — R0602 Shortness of breath: Secondary | ICD-10-CM

## 2019-04-14 NOTE — Telephone Encounter (Signed)
Please schedule virtual visit

## 2019-04-14 NOTE — Telephone Encounter (Signed)
Community message sent to the [facility] for COVID testing and the pt is aware someone will call her with appt info.  Follow up appt scheduled for 6/23.

## 2019-04-14 NOTE — Telephone Encounter (Signed)
Pt called stating that her daughter was exposed to a positive Covid-19 patient.  She states that she has had a dry cough for 3 days. Chills fatigue and some mild SOB with activity. Patient is wanting to be tested for COVID-19. She has no fever. Care advice read to patient. Patient verbalized understanding of all instructions.  Call placed to [facility] at office. Note will be routed as instructed. Pt is aware.  Reason for Disposition . [1] COVID-19 infection suspected by caller or triager AND [2] mild symptoms (cough, fever, or others) AND [9] no complications or SOB  Answer Assessment - Initial Assessment Questions 1. COVID-19 DIAGNOSIS: "Who made your Coronavirus (COVID-19) diagnosis?" "Was it confirmed by a positive lab test?" If not diagnosed by a HCP, ask "Are there lots of cases (community spread) where you live?" (See public health department website, if unsure)     [place] 2. ONSET: "When did the COVID-19 symptoms start?"      3 days ago 3. WORST SYMPTOM: "What is your worst symptom?" (e.g., cough, fever, shortness of breath, muscle aches)     cough 4. COUGH: "Do you have a cough?" If so, ask: "How bad is the cough?"       dry 5. FEVER: "Do you have a fever?" If so, ask: "What is your temperature, how was it measured, and when did it start?"     chills 6. RESPIRATORY STATUS: "Describe your breathing?" (e.g., shortness of breath, wheezing, unable to speak)      SOB small amount 7. BETTER-SAME-WORSE: "Are you getting better, staying the same or getting worse compared to yesterday?"  If getting worse, ask, "In what way?"     same 8. HIGH RISK DISEASE: "Do you have any chronic medical problems?" (e.g., asthma, heart or lung disease, weak immune system, etc.)    No  Skin CA 9. PREGNANCY: "Is there any chance you are pregnant?" "When was your last menstrual period?"     No 10. OTHER SYMPTOMS: "Do you have any other symptoms?"  (e.g., chills, fatigue, headache, loss of smell or taste, muscle  pain, sore throat)      Chill, fatigue, headache,  Protocols used: CORONAVIRUS (COVID-19) DIAGNOSED OR SUSPECTED-A-AH

## 2019-04-14 NOTE — Patient Instructions (Signed)
I have asked my assistant to order Coronavirus (COVID19) testing for you. Please call our office if you have any concerns or questions or this testing has not been arranged in the next 24-48 hours.   Self Isolate: -see the CDC site for information:   [email or web address].za.html   -stay home except for to seek medical care -stay in your own room away from others in your house, wash hands frequently, wear a mask if you leave your room and interacted as little as possible with others -seek medical care if worsening - call out office for a visit or call ahead if going elsewhere -seek emergency care if very sick or severe symptoms - call 911 -isolate for at least 10 days from the onset of symptoms PLUS 3 days of no fever PLUS 3 days of improving symptoms, unless instructed otherwise by a doctor.   Follow up in 2 days. Sooner as needed.

## 2019-04-14 NOTE — Telephone Encounter (Signed)
-----   Message from Lucretia Kern, DO sent at 04/14/2019  1:04 PM EDT ----- -COVID19 testing orders - she would like to get donw with her daughter Riyanshi Wahab up with me or Dr. [name] on monday

## 2019-04-14 NOTE — Telephone Encounter (Signed)
Patient referred for COVID-19 testing by Dr. Colin Benton.   Patient called and scheduled for testing on 04/14/19 at [facility] site. Pt advised to wear a mask and to remain in car at appt time. Pt verbalized understanding.

## 2019-04-14 NOTE — Telephone Encounter (Signed)
I scheduled the patient a virtual visit with Dr. Maudie Mercury today.

## 2019-04-14 NOTE — Progress Notes (Signed)
Virtual Visit via Video Note  I connected with Jean Morgan  on 04/14/19 at 12:40 PM EDT by a video enabled telemedicine application and verified that I am speaking with the correct person using two identifiers.  Location patient: home Location provider:work or home office Persons participating in the virtual visit: patient, provider  I discussed the limitations of evaluation and management by telemedicine and the availability of in person appointments. The patient expressed understanding and agreed to proceed.   HPI:  Acute visit for concerns for COVID19. Her daughter hung out with a friend that subsequently was diagnosed with COVID. Daughter got sick a few days after. Patient started having some mild symptoms with a mild cough, mild chills, very mild SOB. Denies fevers, CP, palpitations, NVD.  She works with seniors. Reports symptoms were mild so she actually thought it was allergies as had had them in the past. However, now that daughter has had symptoms and they found out about the exposure she is concerned.  ROS: See pertinent positives and negatives per HPI.  Past Medical History:  Diagnosis Date  . Cancer ([place])    skin  . Hypothyroidism     Past Surgical History:  Procedure Laterality Date  . DENTAL SURGERY    . TONSILLECTOMY      Family History  Problem Relation Age of Onset  . Colon polyps Mother   . Colitis Mother   . Arthritis Mother   . Hyperlipidemia Mother   . Diabetes Mother   . Heart disease Father   . Hyperlipidemia Father   . Hypertension Father   . Colon cancer Paternal Aunt   . Diabetes Maternal Grandmother   . Breast cancer Maternal Aunt     SOCIAL HX: see hpi   Current Outpatient Medications:  .  Ascorbic Acid (VITAMIN C ER PO), Take by mouth., Disp: , Rfl:  .  Cholecalciferol (VITAMIN D PO), Take by mouth as needed. , Disp: , Rfl:  .  GARLIC PO, Take by mouth., Disp: , Rfl:  .  valACYclovir (VALTREX) 1000 MG tablet, Start ASAP after symptoms onset  and within 48 hours:1 tab daily for 5 days prn., Disp: 60 tablet, Rfl: 0  EXAM:  VITALS per patient if applicable: Y50.3  GENERAL: alert, oriented, appears well and in no acute distress  HEENT: atraumatic, conjunttiva clear, no obvious abnormalities on inspection of external nose and ears, mild erythema of the post oropharynx.  NECK: normal movements of the head and neck  LUNGS: on inspection no signs of respiratory distress, breathing rate appears normal, no obvious gross SOB, gasping or wheezing  CV: no obvious cyanosis  MS: moves all visible extremities without noticeable abnormality  PSYCH/NEURO: pleasant and cooperative, no obvious depression or anxiety, speech and thought processing grossly intact  ASSESSMENT AND PLAN:  More than 50% of over 25 minutes spent in total in caring for this patient was spent  counseling and/or coordinating care.   Discussed the following assessment and plan:  Cough  Exposure to 2019 Novel Coronavirus possible  SOB (shortness of breath)   -we discussed possible serious and likely etiologies, workup and treatment, treatment risks and return precautions -after this discussion, Jean Morgan opted for COVID19 testing, self isolation per cdc guidelines, she was advised to contact her work place today to report symptoms, testing and self isolation advised, symptomatic care -follow up advised with me or PCP in a few days -of course, we advised Jean Morgan  to return or notify a doctor immediately if symptoms worsen or  persist or new concerns arise.    I discussed the assessment and treatment plan with the patient. The patient was provided an opportunity to ask questions and all were answered. The patient agreed with the plan and demonstrated an understanding of the instructions.   The patient was advised to call back or seek an in-person evaluation if the symptoms worsen or if the condition fails to improve as anticipated.   Follow up instructions: Advised  assistant Jean Morgan to help patient arrange the following: -COVID19 testing orders - she would like to get donw with her daughter [name] -follow up with me or Dr. [name] on [address], DO

## 2019-04-16 LAB — NOVEL CORONAVIRUS, NAA: SARS-CoV-2, NAA: NOT DETECTED

## 2019-04-19 ENCOUNTER — Ambulatory Visit (INDEPENDENT_AMBULATORY_CARE_PROVIDER_SITE_OTHER): Payer: BC Managed Care – PPO | Admitting: Family Medicine

## 2019-04-19 ENCOUNTER — Other Ambulatory Visit: Payer: Self-pay

## 2019-04-19 ENCOUNTER — Encounter: Payer: Self-pay | Admitting: Family Medicine

## 2019-04-19 DIAGNOSIS — J989 Respiratory disorder, unspecified: Secondary | ICD-10-CM

## 2019-04-19 NOTE — Progress Notes (Signed)
Virtual Visit via Video Note  I connected with Jean Morgan  on 04/19/19 at 11:20 AM EDT by a video enabled telemedicine application and verified that I am speaking with the correct person using two identifiers.  Location patient: home Location provider:work or home office Persons participating in the virtual visit: patient, provider  I discussed the limitations of evaluation and management by telemedicine and the availability of in person appointments. The patient expressed understanding and agreed to proceed.   HPI:  Follow up resp symptoms: -reports is doing better -COVID testing was negative  -she thinks symptoms are likely from her allergies and gaining some weight and seem to be back at baseline -still has mild congestion, occ mild dypnea on stairs -no fevers, cp, sob otherwise -is on 14 day quarantine   ROS: See pertinent positives and negatives per HPI.  Past Medical History:  Diagnosis Date  . Cancer ([place])    skin  . Hypothyroidism     Past Surgical History:  Procedure Laterality Date  . DENTAL SURGERY    . TONSILLECTOMY      Family History  Problem Relation Age of Onset  . Colon polyps Mother   . Colitis Mother   . Arthritis Mother   . Hyperlipidemia Mother   . Diabetes Mother   . Heart disease Father   . Hyperlipidemia Father   . Hypertension Father   . Colon cancer Paternal Aunt   . Diabetes Maternal Grandmother   . Breast cancer Maternal Aunt     SOCIAL HX: see hpi   Current Outpatient Medications:  .  Ascorbic Acid (VITAMIN C ER PO), Take by mouth., Disp: , Rfl:  .  Cholecalciferol (VITAMIN D PO), Take by mouth as needed. , Disp: , Rfl:  .  GARLIC PO, Take by mouth., Disp: , Rfl:  .  valACYclovir (VALTREX) 1000 MG tablet, Start ASAP after symptoms onset and within 48 hours:1 tab daily for 5 days prn., Disp: 60 tablet, Rfl: 0  EXAM:  VITALS per patient if applicable:denies fever  GENERAL: alert, oriented, appears well and in no acute  distress  HEENT: atraumatic, conjunttiva clear, no obvious abnormalities on inspection of external nose and ears  NECK: normal movements of the head and neck  LUNGS: on inspection no signs of respiratory distress, breathing rate appears normal, no obvious gross SOB, gasping or wheezing  CV: no obvious cyanosis  MS: moves all visible extremities without noticeable abnormality  PSYCH/NEURO: pleasant and cooperative, no obvious depression or anxiety, speech and thought processing grossly intact  ASSESSMENT AND PLAN:  Discussed the following assessment and plan:  Respiratory illness - Plan:  -continue and complete 14 day quarantine -in person follow up after quarantine (she wants labs for thyroid, etc)/further eval if any dyspnea remains (TSH, EKG stress, pulm exam, etc) -follow up as needed in the interim   I discussed the assessment and treatment plan with the patient. The patient was provided an opportunity to ask questions and all were answered. The patient agreed with the plan and demonstrated an understanding of the instructions.   The patient was advised to call back or seek an in-person evaluation if the symptoms worsen or if the condition fails to improve as anticipated.   Follow up instructions: Advised assistant [name] to help patient arrange the following: -follow up with PCP after quarantine in 2-3 weeks  Lucretia Kern, DO

## 2019-04-20 ENCOUNTER — Telehealth: Payer: Self-pay | Admitting: *Deleted

## 2019-04-20 NOTE — Telephone Encounter (Signed)
-----   Message from Lucretia Kern, DO sent at 04/19/2019 11:48 AM EDT ----- -follow up in person with PCP after quarantine in 2-3 weeks

## 2019-04-20 NOTE — Telephone Encounter (Signed)
I called the pt to schedule an appt as below and she requested to change from Dr [name] to Dr Ethlyn Gallery.  Message sent to Dr [name].

## 2019-04-21 NOTE — Telephone Encounter (Signed)
Fine with me. Thanks, BJ 

## 2019-04-22 NOTE — Telephone Encounter (Signed)
I left a detailed message at the pts cell number with the information below and to call back for a transfer of care visit with Dr Ethlyn Gallery.

## 2019-04-26 ENCOUNTER — Ambulatory Visit (INDEPENDENT_AMBULATORY_CARE_PROVIDER_SITE_OTHER): Payer: BC Managed Care – PPO | Admitting: Family Medicine

## 2019-04-26 ENCOUNTER — Encounter: Payer: Self-pay | Admitting: Family Medicine

## 2019-04-26 ENCOUNTER — Other Ambulatory Visit: Payer: Self-pay

## 2019-04-26 ENCOUNTER — Other Ambulatory Visit (INDEPENDENT_AMBULATORY_CARE_PROVIDER_SITE_OTHER): Payer: BC Managed Care – PPO

## 2019-04-26 ENCOUNTER — Ambulatory Visit (INDEPENDENT_AMBULATORY_CARE_PROVIDER_SITE_OTHER): Payer: BC Managed Care – PPO

## 2019-04-26 ENCOUNTER — Telehealth: Payer: Self-pay | Admitting: *Deleted

## 2019-04-26 DIAGNOSIS — E785 Hyperlipidemia, unspecified: Secondary | ICD-10-CM

## 2019-04-26 DIAGNOSIS — R0789 Other chest pain: Secondary | ICD-10-CM

## 2019-04-26 DIAGNOSIS — R7989 Other specified abnormal findings of blood chemistry: Secondary | ICD-10-CM

## 2019-04-26 DIAGNOSIS — R0602 Shortness of breath: Secondary | ICD-10-CM

## 2019-04-26 LAB — LIPID PANEL
Cholesterol: 255 mg/dL — ABNORMAL HIGH (ref 0–200)
HDL: 66 mg/dL (ref >39.00)
LDL Cholesterol: 175 mg/dL — ABNORMAL HIGH (ref 0–99)
NonHDL: 188.93
Total CHOL/HDL Ratio: 4
Triglycerides: 70 mg/dL (ref 0.0–149.0)
VLDL: 14 mg/dL (ref 0.0–40.0)

## 2019-04-26 LAB — T4, FREE: Free T4: 0.84 ng/dL (ref 0.60–1.60)

## 2019-04-26 LAB — TSH: TSH: 2.66 u[IU]/mL (ref 0.35–4.50)

## 2019-04-26 NOTE — Telephone Encounter (Signed)
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  Message sent to Dr [name] as due to her symptoms which meets the positive pre-visit screening questions.

## 2019-04-26 NOTE — Telephone Encounter (Signed)
Spoke with patient and scheduled a virtual visit for this morning.

## 2019-04-26 NOTE — Progress Notes (Signed)
Virtual Visit via Video Note   I connected with [name] on 04/26/19 at 11:00 AM EDT by a video enabled telemedicine application and verified that I am speaking with the correct person using two identifiers.  Location patient: home Location provider:home office Persons participating in the virtual visit: patient, provider  I discussed the limitations of evaluation and management by telemedicine and the availability of in person appointments. The patient expressed understanding and agreed to proceed.   HPI: [name] is a 58 yo female with Hx of HLD and hypothyroidism c/o SOB. She was evaluated on 04/14/19 because cough and 04/19/19 due to congestion and mold SOB. COVID 19 testing negative.  Non productive cough,"little better." SOB with exertion, when going up stairs she feels "out of breath." Negative for wheezing.  Chest tightness happens also at rest intermittently. Pain is not radiated,she is not sure about duration. Denies palpitations,diaphoresis,or dizziness.  Denies fever,chills,fatigue,body aches, anosmia,or ageusia. Mild nasal congestion and rhinorrhea, both attributed to allergies.  She has not tried OTC medication.  She also would like to have labs done. Abnormal TSH, she did not keep lab appt for follow-up. C/O wt gain. She does not exercise regularly,follows a healthful diet. + Stress.  Negative for cold/hot intolerance,changes in bowel habits,tremor,or edema.  Lab Results  Component Value Date   TSH 3.55 01/07/2019   HLD, she is on non pharmacologic treatment.  Lab Results  Component Value Date   CHOL 265 (H) 08/18/2018   HDL 69.30 08/18/2018   LDLCALC 179 (H) 08/18/2018   TRIG 84.0 08/18/2018   CHOLHDL 4 08/18/2018    ROS: See pertinent positives and negatives per HPI.  Past Medical History:  Diagnosis Date  . Cancer ([place])    skin  . Hypothyroidism     Past Surgical History:  Procedure Laterality Date  . DENTAL SURGERY    .  TONSILLECTOMY      Family History  Problem Relation Age of Onset  . Colon polyps Mother   . Colitis Mother   . Arthritis Mother   . Hyperlipidemia Mother   . Diabetes Mother   . Heart disease Father   . Hyperlipidemia Father   . Hypertension Father   . Colon cancer Paternal Aunt   . Diabetes Maternal Grandmother   . Breast cancer Maternal Aunt     Social History   Socioeconomic History  . Marital status: Single    Spouse name: Not on file  . Number of children: Not on file  . Years of education: Not on file  . Highest education level: Not on file  Occupational History  . Not on file  Social Needs  . Financial resource strain: Not on file  . Food insecurity    Worry: Not on file    Inability: Not on file  . Transportation needs    Medical: Not on file    Non-medical: Not on file  Tobacco Use  . Smoking status: Never Smoker  . Smokeless tobacco: Never Used  Substance and Sexual Activity  . Alcohol use: Yes    Alcohol/week: 1.0 - 3.0 standard drinks    Types: 1 - 3 Glasses of wine per week  . Drug use: No  . Sexual activity: Not on file  Lifestyle  . Physical activity    Days per week: Not on file    Minutes per session: Not on file  . Stress: Not on file  Relationships  . Social Herbalist on phone: Not  on file    Gets together: Not on file    Attends religious service: Not on file    Active member of club or organization: Not on file    Attends meetings of clubs or organizations: Not on file    Relationship status: Not on file  . Intimate partner violence    Fear of current or ex partner: Not on file    Emotionally abused: Not on file    Physically abused: Not on file    Forced sexual activity: Not on file  Other Topics Concern  . Not on file  Social History Narrative  . Not on file      Current Outpatient Medications:  .  Ascorbic Acid (VITAMIN C ER PO), Take by mouth., Disp: , Rfl:  .  Cholecalciferol (VITAMIN D PO), Take by mouth as  needed. , Disp: , Rfl:  .  GARLIC PO, Take by mouth., Disp: , Rfl:  .  valACYclovir (VALTREX) 1000 MG tablet, Start ASAP after symptoms onset and within 48 hours:1 tab daily for 5 days prn., Disp: 60 tablet, Rfl: 0  EXAM:  VITALS per patient if applicable:  GENERAL: alert, oriented, appears well and in no acute distress  HEENT: atraumatic, conjunctiva clear, no obvious facial abnormalities on inspection.  NECK: normal movements of the head and neck  LUNGS: on inspection no signs of respiratory distress, breathing rate appears normal, no obvious gross SOB, gasping or wheezing  CV: no obvious cyanosis  MS: moves all visible extremities without noticeable abnormality  PSYCH/NEURO: pleasant and cooperative, no obvious depression,+anxious. Speech and thought processing grossly intact  ASSESSMENT AND PLAN:  Discussed the following assessment and plan:  Lab Results  Component Value Date   TSH 2.66 04/26/2019   Lab Results  Component Value Date   CHOL 255 (H) 04/26/2019   HDL 66.00 04/26/2019   LDLCALC 175 (H) 04/26/2019   TRIG 70.0 04/26/2019   CHOLHDL 4 04/26/2019    Shortness of breath - Plan: DG Chest 2 View, EKG 12-Lead, CANCELED: EKG 12-Lead Not in acute respiratory distress at this time. ? Allergies,residual from recent URI among some. Explained that cough after URI can last a few days and even weeks. Instructed about warning signs. CXR will be arranged.  Hyperlipidemia, unspecified hyperlipidemia type - Plan: Lipid panel. Continue low fat diet. Further recommendations according to [place].  Abnormal TSH - Plan: T4, free, TSH. Slightly elevated. Labs will be schedule for today. Further recommendations will be given accordingly.  Tight chest - Plan: DG Chest 2 View, EKG 12-Lead. Possible etiologies discussed. Explained that Hx does not suggest cardiac etiology but the probability is never zero. Instructed about warning signs. EKG will be arrange.   EKG today  NSR,normal axis and intervals.  She also wants me to order COVID 19 abs for her daughter who was very sick in 12/2018 and planning on travelling to [place] this fall.Orer placed.    I discussed the assessment and treatment plan with the patient. She was provided an opportunity to ask questions and all were answered. The patient agreed with the plan and demonstrated an understanding of the instructions.   The patient was advised to call back or seek an in-person evaluation if the symptoms worsen or if the condition fails to improve as anticipated.  Return if symptoms worsen or fail to improve.    Lareen Mullings [name], MD

## 2019-04-26 NOTE — Telephone Encounter (Signed)
Labs placed by provider. Patient scheduled for lab appointment. Nothing further needed at this time.   Copied from [place] [id number]. Topic: General - Other >> Apr 26, 2019  7:37 AM Keene Breath wrote: Reason for CRM:  Patient called to make an appt. For a chest x-ray.  Please call patient back at [phone] >> Apr 26, 2019 12:03 PM Cox, Melburn Hake, CMA wrote: There are no orders for cxr.

## 2019-12-16 ENCOUNTER — Telehealth: Payer: Self-pay | Admitting: Emergency Medicine

## 2019-12-16 NOTE — Telephone Encounter (Signed)
Pt called to ask if there are any restrictions on pts with a hx of lymph node removal in one arm/breast when it comes to receiving the Covid-19 vaccine.  Pt advised that currently the recommendation of our physicians is that patients both off and on active tx can receive the vaccine and that there are no additional restrictions that we are aware of at this time.  Pt advised to have vaccine administered on non-lymph removal side if possible if she does have the shot, and to call her PCP if she starts experiencing any side effects from the shot.  Pt verbalized understanding and denies any further questions/concerns at this time.

## 2019-12-26 ENCOUNTER — Telehealth (INDEPENDENT_AMBULATORY_CARE_PROVIDER_SITE_OTHER): Payer: BC Managed Care – PPO | Admitting: Family Medicine

## 2019-12-26 ENCOUNTER — Encounter: Payer: Self-pay | Admitting: Family Medicine

## 2019-12-26 DIAGNOSIS — R0602 Shortness of breath: Secondary | ICD-10-CM

## 2019-12-26 DIAGNOSIS — M25561 Pain in right knee: Secondary | ICD-10-CM

## 2019-12-26 DIAGNOSIS — M25562 Pain in left knee: Secondary | ICD-10-CM

## 2019-12-26 DIAGNOSIS — E785 Hyperlipidemia, unspecified: Secondary | ICD-10-CM

## 2019-12-26 DIAGNOSIS — Z0189 Encounter for other specified special examinations: Secondary | ICD-10-CM

## 2019-12-26 DIAGNOSIS — R5383 Other fatigue: Secondary | ICD-10-CM | POA: Diagnosis not present

## 2019-12-26 MED ORDER — CETIRIZINE HCL 10 MG PO TABS: 10.0000 mg | ORAL_TABLET | Freq: Every day | ORAL | 11 refills | Status: DC

## 2019-12-26 NOTE — Progress Notes (Signed)
Virtual Visit via Telephone Note  I connected with Jean Morgan on 12/26/19 at  3:00 PM EST by telephone and verified that I am speaking with the correct person using two identifiers.   I discussed the limitations, risks, security and privacy concerns of performing an evaluation and management service by telephone and the availability of in person appointments. I also discussed with the patient that there may be a patient responsible charge related to this service. The patient expressed understanding and agreed to proceed.  Location patient: home Location provider: work office Participants present for the call: patient, provider Patient did not have a visit in the prior 7 days to address this/these issue(s).   History of Present Illness: Jean Morgan is a 59 yo female who has a few concerns today. 2 weeks of parietal occipital dull like headache, 2/10. She has headache about q 2 days. She does not take medication usually, closes her eye for a few minutes. Sometimes she has taken Aleve. No associated photophobia,N/V, or focal deficit. She is reporting this as a new problem.  Problem has been stable.  "Swollen glands" and feeling tired for about 2 weeks. Neck soreness and "tight" shoulders. No limitation of ROM.  It has improved,she was feeling some "lumps" but not now. Negative for fever,chills,sore throat, anosmia,ageusia,or wheezing. " Little" SOB for 2 weeks, exacerbated by walking up stairs "but not always."  Cough initially but has resolved. Bilateral earache for 2-3 weeks. No changes in hearing or ear drainage. Not sure about exacerbating or alleviating factors.  + Nasal congestion and rhinorrhea, 2-3 weeks ago,which she ttributed to allergies.  Negative for abdominal pain, nausea, vomiting, changes in bowel habits,or urinary symptoms.  She has always had fatigue but it seems to be getting worse. She is concerned about an "immunologic" disorder or Epstein-Barr  infection. Negative for know insect bite.  + Arthralgias for 3 weeks, knee pain. No joint edema or erythema. Exacerbated by sitting crossing legs.  Hx of anxiety and depression but denies having any symptom now. No Hx of OSA. Most of the times he does not feel related when she gets up in the morning. Sleeps about 4-5 hours then wake up and sometimes she is not able to go back to sleep.  She would lie to have vit D check,no known hx of vit D deficiency. She also wants to have COVID 19 ab. Her daughter was in contact with an infected person, she and her daughter were screened and negative.  She also wants her cholesterol check. She is on non pharmacologic treatment.  Lab Results  Component Value Date   CHOL 255 (H) 04/26/2019   HDL 66.00 04/26/2019   LDLCALC 175 (H) 04/26/2019   TRIG 70.0 04/26/2019   CHOLHDL 4 04/26/2019    Observations/Objective: Patient sounds cheerful and well on the phone. I do not appreciate any SOB. Speech and thought processing are grossly intact.+ Anxious. Patient reported vitals:N/A  Assessment and Plan:  1. Shortness of breath Possible etiologies discussed. 03/2019 CXR done due to SOB, it was negative. CXR will be arranged.  - DG Chest 2 View; Future  2. Fatigue, unspecified type We discussed possible etiologies: Systemic illness, immunologic,endocrinology,sleep disorder, psychiatric/psychologic, infectious,medications side effects, and idiopathic. Examination today does not suggest a serious process. Healthy diet and regular physical activity may help.  Further recommendations will be given according to lab results.  - CBC; Future - VITAMIN D 25 Hydroxy (Vit-D Deficiency, Fractures); Future - TSH; Future - Comprehensive metabolic panel; Future -  C-reactive protein; Future - Sedimentation rate; Future - B. burgdorfi antibodies; Future  3. Pain in both knees, unspecified chronicity ? OA. Tylenol 500 mg 2-3 ties per day may help.  -  Comprehensive metabolic panel; Future - Lipid panel; Future - B. burgdorfi antibodies; Future  4. Hyperlipidemia, unspecified hyperlipidemia type Further recommendations will be given according to FLP results. - Comprehensive metabolic panel; Future  5. Patient requested diagnostic testing - VITAMIN D 25 Hydroxy (Vit-D Deficiency, Fractures); Future - SARS-COV-2 IgG; Future  Follow Up Instructions:  Return for Lab,fasting and if possible this wednesday. .  I did not refer this patient for an OV in the next 24 hours for this/these issue(s).  I discussed the assessment and treatment plan with the patient. Jean Morgan was provided an opportunity to ask questions and all were answered. She agreed with the plan and demonstrated an understanding of the instructions.   I provided 29 minutes of non-face-to-face time during this encounter.   Agustina Witzke [name], MD

## 2019-12-29 ENCOUNTER — Other Ambulatory Visit: Payer: Self-pay

## 2019-12-29 ENCOUNTER — Telehealth: Payer: Self-pay | Admitting: Family Medicine

## 2019-12-29 ENCOUNTER — Other Ambulatory Visit (INDEPENDENT_AMBULATORY_CARE_PROVIDER_SITE_OTHER): Payer: BC Managed Care – PPO

## 2019-12-29 DIAGNOSIS — Z0189 Encounter for other specified special examinations: Secondary | ICD-10-CM | POA: Diagnosis not present

## 2019-12-29 DIAGNOSIS — M25562 Pain in left knee: Secondary | ICD-10-CM | POA: Diagnosis not present

## 2019-12-29 DIAGNOSIS — R5383 Other fatigue: Secondary | ICD-10-CM | POA: Diagnosis not present

## 2019-12-29 DIAGNOSIS — E785 Hyperlipidemia, unspecified: Secondary | ICD-10-CM

## 2019-12-29 DIAGNOSIS — M25561 Pain in right knee: Secondary | ICD-10-CM

## 2019-12-29 LAB — COMPREHENSIVE METABOLIC PANEL
ALT: 16 U/L (ref 0–35)
AST: 17 U/L (ref 0–37)
Albumin: 4.4 g/dL (ref 3.5–5.2)
Alkaline Phosphatase: 79 U/L (ref 39–117)
BUN: 11 mg/dL (ref 6–23)
CO2: 28 mEq/L (ref 19–32)
Calcium: 9.6 mg/dL (ref 8.4–10.5)
Chloride: 104 mEq/L (ref 96–112)
Creatinine, Ser: 0.77 mg/dL (ref 0.40–1.20)
GFR: 76.44 mL/min (ref >60.00)
Glucose, Bld: 92 mg/dL (ref 70–99)
Potassium: 3.8 mEq/L (ref 3.5–5.1)
Sodium: 141 mEq/L (ref 135–145)
Total Bilirubin: 0.5 mg/dL (ref 0.2–1.2)
Total Protein: 7 g/dL (ref 6.0–8.3)

## 2019-12-29 LAB — SARS-COV-2 IGG: SARS-COV-2 IgG: 0.06

## 2019-12-29 LAB — CBC
HCT: 37.9 % (ref 36.0–46.0)
Hemoglobin: 12.8 g/dL (ref 12.0–15.0)
MCHC: 33.7 g/dL (ref 30.0–36.0)
MCV: 92.4 fl (ref 78.0–100.0)
Platelets: 279 10*3/uL (ref 150.0–400.0)
RBC: 4.1 Mil/uL (ref 3.87–5.11)
RDW: 13 % (ref 11.5–15.5)
WBC: 7.3 10*3/uL (ref 4.0–10.5)

## 2019-12-29 LAB — LIPID PANEL
Cholesterol: 259 mg/dL — ABNORMAL HIGH (ref 0–200)
HDL: 61.6 mg/dL (ref >39.00)
LDL Cholesterol: 178 mg/dL — ABNORMAL HIGH (ref 0–99)
NonHDL: 197.15
Total CHOL/HDL Ratio: 4
Triglycerides: 94 mg/dL (ref 0.0–149.0)
VLDL: 18.8 mg/dL (ref 0.0–40.0)

## 2019-12-29 LAB — VITAMIN D 25 HYDROXY (VIT D DEFICIENCY, FRACTURES): VITD: 33.53 ng/mL (ref 30.00–100.00)

## 2019-12-29 LAB — TSH: TSH: 3.75 u[IU]/mL (ref 0.35–4.50)

## 2019-12-29 LAB — SEDIMENTATION RATE: Sed Rate: 17 mm/hr (ref 0–30)

## 2019-12-29 LAB — C-REACTIVE PROTEIN: CRP: <1 mg/dL (ref 0.5–20.0)

## 2019-12-29 NOTE — Telephone Encounter (Signed)
Patient wants to know if she can get her chest xray done next week while she is here.  She wondered if there was anything to be concerned about before next week, and if so she can come in earlier.

## 2019-12-30 LAB — B. BURGDORFI ANTIBODIES: B burgdorferi Ab IgG+IgM: <0.9 index

## 2019-12-30 NOTE — Telephone Encounter (Signed)
It is ok to have CXR when she is here. There is an order in place. Thanks, BJ

## 2020-01-03 ENCOUNTER — Other Ambulatory Visit: Payer: Self-pay

## 2020-01-04 ENCOUNTER — Other Ambulatory Visit: Payer: Self-pay | Admitting: Family Medicine

## 2020-01-04 ENCOUNTER — Encounter: Payer: Self-pay | Admitting: Family Medicine

## 2020-01-04 ENCOUNTER — Ambulatory Visit (INDEPENDENT_AMBULATORY_CARE_PROVIDER_SITE_OTHER): Payer: BC Managed Care – PPO

## 2020-01-04 ENCOUNTER — Ambulatory Visit (INDEPENDENT_AMBULATORY_CARE_PROVIDER_SITE_OTHER): Payer: BC Managed Care – PPO | Admitting: Family Medicine

## 2020-01-04 VITALS — BP 128/80 | HR 92 | Resp 12 | Ht 66.0 in | Wt 148.0 lb

## 2020-01-04 DIAGNOSIS — R748 Abnormal levels of other serum enzymes: Secondary | ICD-10-CM | POA: Insufficient documentation

## 2020-01-04 DIAGNOSIS — R5383 Other fatigue: Secondary | ICD-10-CM | POA: Diagnosis not present

## 2020-01-04 DIAGNOSIS — R0602 Shortness of breath: Secondary | ICD-10-CM | POA: Diagnosis not present

## 2020-01-04 DIAGNOSIS — E049 Nontoxic goiter, unspecified: Secondary | ICD-10-CM

## 2020-01-04 DIAGNOSIS — N644 Mastodynia: Secondary | ICD-10-CM

## 2020-01-04 DIAGNOSIS — Z780 Asymptomatic menopausal state: Secondary | ICD-10-CM

## 2020-01-04 DIAGNOSIS — E538 Deficiency of other specified B group vitamins: Secondary | ICD-10-CM | POA: Insufficient documentation

## 2020-01-04 DIAGNOSIS — A6000 Herpesviral infection of urogenital system, unspecified: Secondary | ICD-10-CM

## 2020-01-04 DIAGNOSIS — Z0000 Encounter for general adult medical examination without abnormal findings: Secondary | ICD-10-CM

## 2020-01-04 DIAGNOSIS — C439 Malignant melanoma of skin, unspecified: Secondary | ICD-10-CM

## 2020-01-04 DIAGNOSIS — R7989 Other specified abnormal findings of blood chemistry: Secondary | ICD-10-CM | POA: Insufficient documentation

## 2020-01-04 MED ORDER — VALACYCLOVIR HCL 1 G PO TABS: ORAL_TABLET | ORAL | 0 refills | Status: DC

## 2020-01-04 NOTE — Progress Notes (Signed)
HPI:   JeanJean Morgan is a 59 y.o. female, who is here today for her routine physical.  Last CPE: 08/19/19.  Regular exercise 3 or more time per week: Not consistently, walks sometimes. Following a healthful diet: She started eating gluten a year ago. She lives with her husband. She drinks wine, 2 glasses but not daily.  Chronic medical problems: Recurrent genital herpes,anxiety,depression,HLD, hx of melanoma,and OA.  Pap smear:03/2017 no malignancy and negative HPV.  There is no immunization history for the selected administration types on file for this patient.  Mammogram: 11/2016. Colonoscopy: 09/2014. DEXA: N/A  Hep C screening : 03/2017 NR  She has some concerns today.  HLD: She is on non pharmacologic treatment. Follows low fa diet.  Lab Results  Component Value Date   CHOL 259 (H) 12/29/2019   HDL 61.60 12/29/2019   LDLCALC 178 (H) 12/29/2019   TRIG 94.0 12/29/2019   CHOLHDL 4 12/29/2019   Fatigue: Feels fatigue all the time. Sleeps 5 hours. No known sleep apnea. Denies having active depression.  She has hx of fatigue but states that this fatigue is different. Problem is getting worse. No night sweats or abnormal wt loss. Last visit she mentioned having swollen glands.  Lab Results  Component Value Date   VITAMINB12 >1525 (H) 08/18/2018   Lab Results  Component Value Date   WBC 7.3 12/29/2019   HGB 12.8 12/29/2019   HCT 37.9 12/29/2019   MCV 92.4 12/29/2019   PLT 279.0 12/29/2019   Lab Results  Component Value Date   CRP <1.0 12/29/2019   Lab Results  Component Value Date   TSH 3.75 12/29/2019    Breast tenderness: Noted about a month ago,bilateral. It is constant. It seems to be getting better. She has not noted breast masses,skin changes,or nipple discharge. She has not tried OTC treatment.  SOB: For about 3 weeks. Exacerbated by moderate exertion. Alleviated by rest. No associated cough or wheezing. Negative for CP and  diaphoresis.  No hx of tobacco use.  Review of Systems  Constitutional: Negative for appetite change and fever.  HENT: Negative for dental problem, hearing loss, mouth sores and sore throat.   Eyes: Negative for redness and visual disturbance.  Respiratory: Negative for chest tightness.   Cardiovascular: Negative for leg swelling.  Gastrointestinal: Negative for abdominal pain, nausea and vomiting.       No changes in bowel habits.  Endocrine: Negative for cold intolerance, heat intolerance, polydipsia, polyphagia and polyuria.  Genitourinary: Negative for decreased urine volume, dysuria and hematuria.  Musculoskeletal: Positive for arthralgias. Negative for gait problem and myalgias.  Skin: Negative for color change and rash.  Allergic/Immunologic: Positive for environmental allergies.  Neurological: Positive for headaches (occasionally). Negative for syncope and weakness.  Hematological: Does not bruise/bleed easily.  Psychiatric/Behavioral: Negative for confusion. The patient is nervous/anxious.   All other systems reviewed and are negative.   Current Outpatient Medications on File Prior to Visit  Medication Sig Dispense Refill  . Ascorbic Acid (VITAMIN C ER PO) Take by mouth.    . cetirizine (ZYRTEC) 10 MG tablet Take 1 tablet (10 mg total) by mouth daily. 30 tablet 11  . Cholecalciferol (VITAMIN D PO) Take by mouth as needed.     Marland Kitchen GARLIC PO Take by mouth.     No current facility-administered medications on file prior to visit.     Past Medical History:  Diagnosis Date  . Cancer ([place])    skin  .  Hypothyroidism     Past Surgical History:  Procedure Laterality Date  . DENTAL SURGERY    . TONSILLECTOMY      Allergies  Allergen Reactions  . Latex   . Oxycodone Other (See Comments)    Other Reaction: "too strong a drug for me"    Family History  Problem Relation Age of Onset  . Colon polyps Mother   . Colitis Mother   . Arthritis Mother   . Hyperlipidemia  Mother   . Diabetes Mother   . Heart disease Father   . Hyperlipidemia Father   . Hypertension Father   . Colon cancer Paternal Aunt   . Diabetes Maternal Grandmother   . Breast cancer Maternal Aunt    Social History   Socioeconomic History  . Marital status: Married    Spouse name: Not on file  . Number of children: Not on file  . Years of education: Not on file  . Highest education level: Not on file  Occupational History  . Not on file  Tobacco Use  . Smoking status: Never Smoker  . Smokeless tobacco: Never Used  Substance and Sexual Activity  . Alcohol use: Yes    Alcohol/week: 1.0 - 3.0 standard drinks    Types: 1 - 3 Glasses of wine per week  . Drug use: No  . Sexual activity: Not on file  Other Topics Concern  . Not on file  Social History Narrative  . Not on file   Social Determinants of Health   Financial Resource Strain:   . Difficulty of Paying Living Expenses:   Food Insecurity:   . Worried About Charity fundraiser in the Last Year:   . Arboriculturist in the Last Year:   Transportation Needs:   . Film/video editor (Medical):   Marland Kitchen Lack of Transportation (Non-Medical):   Physical Activity:   . Days of Exercise per Week:   . Minutes of Exercise per Session:   Stress:   . Feeling of Stress :   Social Connections:   . Frequency of Communication with Friends and Family:   . Frequency of Social Gatherings with Friends and Family:   . Attends Religious Services:   . Active Member of Clubs or Organizations:   . Attends Archivist Meetings:   Marland Kitchen Marital Status:     Vitals:   01/04/20 0721  BP: 128/80  Pulse: 92  Resp: 12  SpO2: 97%   Body mass index is 23.89 kg/m.   Wt Readings from Last 3 Encounters:  01/04/20 148 lb (67.1 kg)  08/18/18 140 lb 8 oz (63.7 kg)  02/10/18 134 lb (60.8 kg)    Physical Exam  Nursing note and vitals reviewed. Constitutional: She is oriented to person, place, and time. She appears well-developed  and well-nourished. No distress.  HENT:  Head: Normocephalic and atraumatic.  Right Ear: Hearing, tympanic membrane, external ear and ear canal normal.  Left Ear: Hearing, tympanic membrane, external ear and ear canal normal.  Mouth/Throat: Uvula is midline, oropharynx is clear and moist and mucous membranes are normal.  Eyes: Pupils are equal, round, and reactive to light. Conjunctivae and EOM are normal.  Neck: No tracheal deviation present. Thyromegaly (?Right) present.  Cardiovascular: Normal rate and regular rhythm.  No murmur heard. Pulses:      Dorsalis pedis pulses are 2+ on the right side and 2+ on the left side.  Respiratory: Effort normal and breath sounds normal. No respiratory distress.  GI: Soft. She exhibits no mass. There is no hepatomegaly. There is no abdominal tenderness.  Genitourinary:    Genitourinary Comments: Breast: No masses,skin changes,or nipple discharge. Fibrocystic like changes upper outer quadrant bilateral.   Musculoskeletal:        General: No edema.     Comments: No major signs of synovitis appreciated.  Lymphadenopathy:    She has no cervical adenopathy.    She has no axillary adenopathy.       Right: No supraclavicular adenopathy present.       Left: No supraclavicular adenopathy present.  Cervical lymph nodes palpable bilateral,not enlarged.  Neurological: She is alert and oriented to person, place, and time. She has normal strength. No cranial nerve deficit. Coordination and gait normal.  Reflex Scores:      Bicep reflexes are 2+ on the right side and 2+ on the left side.      Patellar reflexes are 2+ on the right side and 2+ on the left side. Skin: Skin is warm. No rash noted. No erythema.  Psychiatric: Her mood appears anxious.  Well groomed, good eye contact.   ASSESSMENT AND PLAN:  Ms. Megean Fabio was here today annual physical examination.  The 10-year ASCVD risk score Mikey Bussing DC Brooke Bonito., et al., 2013) is: 3.7%   Values used to calculate  the score:     Age: 23 years     Sex: Female     Is Non-Hispanic African American: No     Diabetic: No     Tobacco smoker: No     Systolic Blood Pressure: 735 mmHg     Is BP treated: No     HDL Cholesterol: 61.6 mg/dL     Total Cholesterol: 259 mg/dL   Orders Placed This Encounter  Procedures  . US THYROID  . DEXAScan  . MM DIAG BREAST TOMO BILATERAL  . Cortisol, free, Serum     Routine general medical examination at a health care facility We discussed the importance of regular physical activity and healthy diet for prevention of chronic illness and/or complications. Preventive guidelines reviewed. Vaccination: She needs shingrix, will hold on this until she completes COVID 19 vaccination. Pap smear is due in 2023. Ca++ and vit D supplementation recommended. Next CPE in a year.  B12 deficiency Last B12 was above normal. She is not on B12 supplementation. Further recommendations according to lab results.  Fatigue, unspecified type We discussed possible etiologies: Systemic illness, immunologic,endocrinology,sleep disorder, psychiatric/psychologic, infectious,medications side effects, and idiopathic. Examination today does not suggest a serious process. Work-up has been negative. Sleep study could also be arranged.  Further recommendations will be given according to lab results.  Enlarged thyroid gland Mildly enlarged.  Asymptomatic postmenopausal estrogen deficiency -     DEXAScan; Future  Breast tenderness in female ? Fibrocystic breast disease. Dx mammogram will be arranged.  Recurrent genital herpes Stable. No changes in current management.  -     valACYclovir (VALTREX) 1000 MG tablet; Start ASAP after symptoms onset and within 48 hours:1 tab daily for 5 days prn.  Shortness of breath Vs fatigue. No associated CP,cough,or wheezing. If problem continues we can arrange cardiologist evaluation. Instructed about warning signs.  Melanoma of skin  ([place]) Follows with dermatologist.  Return in 1 year (on 01/03/2021).    [name]. [name], MD  [facility] [place] office.   Today you have you routine preventive visit.   Fatigue, unspecified type - Plan: Cortisol, free, Serum  B12 deficiency  Enlarged thyroid  gland - Plan: US THYROID  Asymptomatic postmenopausal estrogen deficiency  Breast tenderness in female    Fatigue If you have fatigue, you feel tired all the time and have a lack of energy or a lack of motivation. Fatigue may make it difficult to start or complete tasks because of exhaustion. In general, occasional or mild fatigue is often a normal response to activity or life. However, long-lasting (chronic) or extreme fatigue may be a symptom of a medical condition. Follow these instructions at home: General instructions  Watch your fatigue for any changes.  Go to bed and get up at the same time every day.  Avoid fatigue by pacing yourself during the day and getting enough sleep at night.  Maintain a healthy weight. Medicines  Take over-the-counter and prescription medicines only as told by your health care provider.  Take a multivitamin, if told by your health care provider.  Do not use herbal or dietary supplements unless they are approved by your health care provider. Activity   Exercise regularly, as told by your health care provider.  Use or practice techniques to help you relax, such as yoga, tai chi, meditation, or massage therapy. Eating and drinking   Avoid heavy meals in the evening.  Eat a well-balanced diet, which includes lean proteins, whole grains, plenty of fruits and vegetables, and low-fat dairy products.  Avoid consuming too much caffeine.  Avoid the use of alcohol.  Drink enough fluid to keep your urine pale yellow. Lifestyle  Change situations that cause you stress. Try to keep your work and personal schedule in balance.  Do not use any products that contain  nicotine or tobacco, such as cigarettes and e-cigarettes. If you need help quitting, ask your health care provider.  Do not use drugs. Contact a health care provider if:  Your fatigue does not get better.  You have a fever.  You suddenly lose or gain weight.  You have headaches.  You have trouble falling asleep or sleeping through the night.  You feel angry, guilty, anxious, or sad.  You are unable to have a bowel movement (constipation).  Your skin is dry.  You have swelling in your legs or another part of your body. Get help right away if:  You feel confused.  Your vision is blurry.  You feel faint or you pass out.  You have a severe headache.  You have severe pain in your abdomen, your back, or the area between your waist and hips (pelvis).  You have chest pain, shortness of breath, or an irregular or fast heartbeat.  You are unable to urinate, or you urinate less than normal.  You have abnormal bleeding, such as bleeding from the rectum, vagina, nose, lungs, or nipples.  You vomit blood.  You have thoughts about hurting yourself or others. If you ever feel like you may hurt yourself or others, or have thoughts about taking your own life, get help right away. You can go to your nearest emergency department or call:  Your local emergency services (911 in the [place]  A suicide crisis helpline, such as the [place] at 628-166-5437. This is open 24 hours a day. Summary  If you have fatigue, you feel tired all the time and have a lack of energy or a lack of motivation.  Fatigue may make it difficult to start or complete tasks because of exhaustion.  Long-lasting (chronic) or extreme fatigue may be a symptom of a medical condition.  Exercise regularly,  as told by your health care provider.  Change situations that cause you stress. Try to keep your work and personal schedule in balance. This information is not intended to replace  advice given to you by your health care provider. Make sure you discuss any questions you have with your health care provider. Document Revised: 05/04/2019 Document Reviewed: 07/08/2017 Elsevier Patient Education  [place].     At least 150 minutes of moderate exercise per week, daily brisk walking for 15-30 min is a good exercise option. Healthy diet low in saturated (animal) fats and sweets and consisting of fresh fruits and vegetables, lean meats such as fish and white chicken and whole grains.  These are some of recommendations for screening depending of age and risk factors:  - Vaccines:  Tdap vaccine every 10 years.  Shingles vaccine recommended at age 57, could be given after 60 years of age but not sure about insurance coverage.   Pneumonia vaccines: Pneumovax at 77. Sometimes Pneumovax is giving earlier if history of smoking, lung disease,diabetes,kidney disease among some.  Screening for diabetes at age 14 and every 3 years.  Cervical cancer prevention:  Pap smear starts at 60 years of age and continues periodically until 60 years old in low risk women. Pap smear every 3 years between 36 and 62 years old. Pap smear every 3-5 years between women 20 and older if pap smear negative and HPV screening negative.   -Breast cancer: Mammogram: There is disagreement between experts about when to start screening in low risk asymptomatic female but recent recommendations are to start screening at 50 and not later than 60 years old , every 1-2 years and after 59 yo q 2 years. Screening is recommended until 60 years old but some women can continue screening depending of healthy issues.  Colon cancer screening: starts at 60 years old until 60 years old.  Cholesterol disorder screening at age 55 and every 3 years.  Also recommended:  1. Dental visit- Brush and floss your teeth twice daily; visit your dentist twice a year. 2. Eye doctor- Get an eye exam at least every 2  years. 3. Helmet use- Always wear a helmet when riding a bicycle, motorcycle, rollerblading or skateboarding. 4. Safe sex- If you may be exposed to sexually transmitted infections, use a condom. 5. Seat belts- Seat belts can save your live; always wear one. 6. Smoke/Carbon Monoxide detectors- These detectors need to be installed on the appropriate level of your home. Replace batteries at least once a year. 7. Skin cancer- When out in the sun please cover up and use sunscreen 15 SPF or higher. 8. Violence- If anyone is threatening or hurting you, please tell your healthcare provider.  9. Drink alcohol in moderation- Limit alcohol intake to one drink or less per day. Never drink and drive.

## 2020-01-04 NOTE — Patient Instructions (Addendum)
Today you have you routine preventive visit.   Fatigue, unspecified type - Plan: Cortisol, free, Serum  B12 deficiency  Enlarged thyroid gland - Plan: US THYROID  Asymptomatic postmenopausal estrogen deficiency  Breast tenderness in female    Fatigue If you have fatigue, you feel tired all the time and have a lack of energy or a lack of motivation. Fatigue may make it difficult to start or complete tasks because of exhaustion. In general, occasional or mild fatigue is often a normal response to activity or life. However, long-lasting (chronic) or extreme fatigue may be a symptom of a medical condition. Follow these instructions at home: General instructions  Watch your fatigue for any changes.  Go to bed and get up at the same time every day.  Avoid fatigue by pacing yourself during the day and getting enough sleep at night.  Maintain a healthy weight. Medicines  Take over-the-counter and prescription medicines only as told by your health care provider.  Take a multivitamin, if told by your health care provider.  Do not use herbal or dietary supplements unless they are approved by your health care provider. Activity   Exercise regularly, as told by your health care provider.  Use or practice techniques to help you relax, such as yoga, tai chi, meditation, or massage therapy. Eating and drinking   Avoid heavy meals in the evening.  Eat a well-balanced diet, which includes lean proteins, whole grains, plenty of fruits and vegetables, and low-fat dairy products.  Avoid consuming too much caffeine.  Avoid the use of alcohol.  Drink enough fluid to keep your urine pale yellow. Lifestyle  Change situations that cause you stress. Try to keep your work and personal schedule in balance.  Do not use any products that contain nicotine or tobacco, such as cigarettes and e-cigarettes. If you need help quitting, ask your health care provider.  Do not use drugs. Contact a  health care provider if:  Your fatigue does not get better.  You have a fever.  You suddenly lose or gain weight.  You have headaches.  You have trouble falling asleep or sleeping through the night.  You feel angry, guilty, anxious, or sad.  You are unable to have a bowel movement (constipation).  Your skin is dry.  You have swelling in your legs or another part of your body. Get help right away if:  You feel confused.  Your vision is blurry.  You feel faint or you pass out.  You have a severe headache.  You have severe pain in your abdomen, your back, or the area between your waist and hips (pelvis).  You have chest pain, shortness of breath, or an irregular or fast heartbeat.  You are unable to urinate, or you urinate less than normal.  You have abnormal bleeding, such as bleeding from the rectum, vagina, nose, lungs, or nipples.  You vomit blood.  You have thoughts about hurting yourself or others. If you ever feel like you may hurt yourself or others, or have thoughts about taking your own life, get help right away. You can go to your nearest emergency department or call:  Your local emergency services (911 in the [place]  A suicide crisis helpline, such as the [place] at 715-845-1946. This is open 24 hours a day. Summary  If you have fatigue, you feel tired all the time and have a lack of energy or a lack of motivation.  Fatigue may make it difficult to start  or complete tasks because of exhaustion.  Long-lasting (chronic) or extreme fatigue may be a symptom of a medical condition.  Exercise regularly, as told by your health care provider.  Change situations that cause you stress. Try to keep your work and personal schedule in balance. This information is not intended to replace advice given to you by your health care provider. Make sure you discuss any questions you have with your health care provider. Document Revised:  05/04/2019 Document Reviewed: 07/08/2017 Elsevier Patient Education  [place].     At least 150 minutes of moderate exercise per week, daily brisk walking for 15-30 min is a good exercise option. Healthy diet low in saturated (animal) fats and sweets and consisting of fresh fruits and vegetables, lean meats such as fish and white chicken and whole grains.  These are some of recommendations for screening depending of age and risk factors:  - Vaccines:  Tdap vaccine every 10 years.  Shingles vaccine recommended at age 4, could be given after 60 years of age but not sure about insurance coverage.   Pneumonia vaccines: Pneumovax at 37. Sometimes Pneumovax is giving earlier if history of smoking, lung disease,diabetes,kidney disease among some.  Screening for diabetes at age 7 and every 3 years.  Cervical cancer prevention:  Pap smear starts at 60 years of age and continues periodically until 60 years old in low risk women. Pap smear every 3 years between 59 and 99 years old. Pap smear every 3-5 years between women 66 and older if pap smear negative and HPV screening negative.   -Breast cancer: Mammogram: There is disagreement between experts about when to start screening in low risk asymptomatic female but recent recommendations are to start screening at 21 and not later than 60 years old , every 1-2 years and after 60 yo q 2 years. Screening is recommended until 60 years old but some women can continue screening depending of healthy issues.  Colon cancer screening: starts at 60 years old until 60 years old.  Cholesterol disorder screening at age 62 and every 3 years.  Also recommended:  1. Dental visit- Brush and floss your teeth twice daily; visit your dentist twice a year. 2. Eye doctor- Get an eye exam at least every 2 years. 3. Helmet use- Always wear a helmet when riding a bicycle, motorcycle, rollerblading or skateboarding. 4. Safe sex- If you may be exposed to  sexually transmitted infections, use a condom. 5. Seat belts- Seat belts can save your live; always wear one. 6. Smoke/Carbon Monoxide detectors- These detectors need to be installed on the appropriate level of your home. Replace batteries at least once a year. 7. Skin cancer- When out in the sun please cover up and use sunscreen 15 SPF or higher. 8. Violence- If anyone is threatening or hurting you, please tell your healthcare provider.  9. Drink alcohol in moderation- Limit alcohol intake to one drink or less per day. Never drink and drive.

## 2020-01-06 ENCOUNTER — Encounter: Payer: Self-pay | Admitting: Family Medicine

## 2020-01-06 ENCOUNTER — Ambulatory Visit
Admission: RE | Admit: 2020-01-06 | Discharge: 2020-01-06 | Disposition: A | Discharge status: Home / Self Care | Payer: BC Managed Care – PPO | Source: Ambulatory Visit | Attending: Family Medicine | Admitting: Family Medicine

## 2020-01-06 DIAGNOSIS — E049 Nontoxic goiter, unspecified: Secondary | ICD-10-CM

## 2020-01-07 ENCOUNTER — Encounter: Payer: Self-pay | Admitting: Family Medicine

## 2020-01-11 ENCOUNTER — Telehealth: Payer: Self-pay | Admitting: Family Medicine

## 2020-01-11 LAB — CORTISOL, FREE: Cortisol Free, Ser: 0.27 ug/dL

## 2020-01-11 NOTE — Telephone Encounter (Signed)
Patient's lab result is not back yet, having the lab check in to see what the delay is.

## 2020-01-11 NOTE — Telephone Encounter (Signed)
I called and left patient a voicemail. The lab result will be in by this afternoon - so for now we are still waiting. I advised her to let me know if she has any questions in regards to her x-ray or ultrasound & that I will be giving her another call once the lab is in.

## 2020-01-11 NOTE — Telephone Encounter (Signed)
Pt would like a call about her results from [facility] 01/04/19.

## 2020-01-23 ENCOUNTER — Ambulatory Visit
Admission: RE | Admit: 2020-01-23 | Discharge: 2020-01-23 | Disposition: A | Discharge status: Home / Self Care | Payer: BC Managed Care – PPO | Source: Ambulatory Visit | Attending: Family Medicine | Admitting: Family Medicine

## 2020-01-23 ENCOUNTER — Other Ambulatory Visit: Payer: Self-pay

## 2020-01-23 ENCOUNTER — Telehealth: Payer: Self-pay | Admitting: Family Medicine

## 2020-01-23 ENCOUNTER — Other Ambulatory Visit: Payer: Self-pay | Admitting: Family Medicine

## 2020-01-23 DIAGNOSIS — N644 Mastodynia: Secondary | ICD-10-CM

## 2020-01-23 DIAGNOSIS — N6310 Unspecified lump in the right breast, unspecified quadrant: Secondary | ICD-10-CM

## 2020-01-23 DIAGNOSIS — Z780 Asymptomatic menopausal state: Secondary | ICD-10-CM

## 2020-01-23 NOTE — Telephone Encounter (Addendum)
Pt would like to know if Jean Morgan can send her VALIUM b/c she had a mammogram done and they are suspicious and has a biopsy next week they believe it is cancer.   She also said the she had a bone density test done and do not understand the results.   Pt would like to speak to [place] about these things. She would prefer to speak to Jean Morgan   Pharmacy: [facility]: [phone]   Pt can be reached at [phone]

## 2020-01-23 NOTE — Telephone Encounter (Signed)
Valium last filled by former PCP (Daub)

## 2020-01-24 ENCOUNTER — Other Ambulatory Visit: Payer: Self-pay | Admitting: Family Medicine

## 2020-01-24 DIAGNOSIS — F419 Anxiety disorder, unspecified: Secondary | ICD-10-CM

## 2020-01-24 MED ORDER — DIAZEPAM 5 MG PO TABS: ORAL_TABLET | ORAL | 0 refills | Status: DC

## 2020-01-24 NOTE — Telephone Encounter (Signed)
Prescription for Valium 5 mg was sent to her pharmacy to take 1 to 2 tablets before procedure.  She cannot drive when taking medication. Thanks, BJ

## 2020-01-24 NOTE — Telephone Encounter (Signed)
LVM for patient to call back. ?

## 2020-01-24 NOTE — Telephone Encounter (Signed)
Patient was notified that Valium has been sent to her pharmacy and she will need a driver. She is concerned about her b12 and would like to know if her b12 levels can be checked? Please advise. Thanks

## 2020-01-25 ENCOUNTER — Encounter: Payer: Self-pay | Admitting: Family Medicine

## 2020-01-25 DIAGNOSIS — M8580 Other specified disorders of bone density and structure, unspecified site: Secondary | ICD-10-CM | POA: Insufficient documentation

## 2020-01-25 NOTE — Telephone Encounter (Signed)
It is okay to arrange lab appointment to recheck B12. Thanks, BJ

## 2020-01-31 ENCOUNTER — Ambulatory Visit
Admission: RE | Admit: 2020-01-31 | Discharge: 2020-01-31 | Disposition: A | Discharge status: Home / Self Care | Payer: BC Managed Care – PPO | Source: Ambulatory Visit | Attending: Family Medicine | Admitting: Family Medicine

## 2020-01-31 ENCOUNTER — Other Ambulatory Visit: Payer: Self-pay

## 2020-01-31 DIAGNOSIS — N6310 Unspecified lump in the right breast, unspecified quadrant: Secondary | ICD-10-CM

## 2020-02-01 ENCOUNTER — Other Ambulatory Visit: Payer: Self-pay | Admitting: Family Medicine

## 2020-02-01 DIAGNOSIS — F419 Anxiety disorder, unspecified: Secondary | ICD-10-CM

## 2020-02-01 HISTORY — PX: BREAST BIOPSY: SHX20

## 2020-02-01 MED ORDER — CITALOPRAM HYDROBROMIDE 10 MG PO TABS: 10.0000 mg | ORAL_TABLET | Freq: Every day | ORAL | 1 refills | Status: DC

## 2020-02-01 MED ORDER — DIAZEPAM 5 MG PO TABS: 2.5000 mg | ORAL_TABLET | Freq: Two times a day (BID) | ORAL | 1 refills | Status: DC | PRN

## 2020-02-01 NOTE — Telephone Encounter (Signed)
Spoke with Jean Morgan and discussed the results of recent mammogram and Bx.  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  She has an appointment with oncology team on 02/08/2020 to discussed treatment options.  Anxiety is getting worse. She would like to continue with Valium 5 mg, she is taking 1/2 tablet twice daily, occasionally she takes a whole tablet.  She is tolerating the medication well. In the past she took Lexapro but it caused headaches. I recommend trying Celexa 10 mg daily, she is not sure but asked me to send prescription to the pharmacy and she will let me know if she will take it. Valium 5 mg to take twice daily as needed, 40 tablets/month, will be sent to the pharmacy. We discussed some side effects of medications. We will follow in about 3 months, before if needed.  Pier Bosher [name], MD

## 2020-02-01 NOTE — Telephone Encounter (Signed)
Patient returned call to [facility]. Spoke with patient and informed her that per Dr. [name], she could schedule a lab appointment to have B 12 rechecked. Patient verbalized understanding. Patient wanted inform Dr. [name] that she dx with breast cancer on yesterday and that she is scheduled to meet with the oncology team on the 14th. Patient stated that if Dr. [name] felt that it was necessary to have B 12 checked, then she would, but as of right now she wasn't concerned due to her recent dx.

## 2020-02-01 NOTE — Telephone Encounter (Signed)
LVM for patient to return call. 

## 2020-02-01 NOTE — Telephone Encounter (Signed)
Please advise. Is this still ok?

## 2020-02-02 ENCOUNTER — Encounter: Payer: Self-pay | Admitting: *Deleted

## 2020-02-02 DIAGNOSIS — Z170 Estrogen receptor positive status [ER+]: Secondary | ICD-10-CM

## 2020-02-02 DIAGNOSIS — C50511 Malignant neoplasm of lower-outer quadrant of right female breast: Secondary | ICD-10-CM | POA: Insufficient documentation

## 2020-02-06 NOTE — Progress Notes (Signed)
[place]   [phone] [phone] [place] Note   Patient Care Team: [name], [name], MD as PCP - General (Family Medicine) Rolm Bookbinder, MD as Consulting Physician (Dermatology) [name], RN as Oncology Nurse Navigator [name], RN as Oncology Nurse Navigator [name], MD as Consulting Physician (General Surgery) Truitt Merle, MD as Consulting Physician (Hematology) [name], MD as Consulting Physician (Radiation Oncology)  Date of Service:  02/08/2020   CHIEF COMPLAINTS/PURPOSE OF CONSULTATION:  Newly diagnosed Malignant neoplasm of lower-outer quadrant of right breast    Oncology History Overview Note  Cancer Staging Malignant neoplasm of lower-outer quadrant of right breast of female, estrogen receptor positive ([place]) Staging form: Breast, AJCC 8th Edition - Clinical stage from 01/31/2020: Stage IB (cT2, cN0, cM0, G2, ER+, PR+, HER2-) - Signed by Truitt Merle, MD on 02/07/2020    Malignant neoplasm of lower-outer quadrant of right breast of female, estrogen receptor positive ([place])  01/23/2020 Imaging   DEXA 01/23/20  Osteopenia with lowest T-score -1.6 at left femur neck  Site Region Measured Date Measured Age YA BMD Significant CHANGE T-score DualFemur Neck Left  01/23/2020    60.1         -1.6    0.815 g/cm2   AP Spine  L1-L4 (L3) 01/23/2020    60.1         -1.1    1.035 g/cm2   DualFemur Total Mean 01/23/2020    60.1         -1.2    0.851 g/cm2   01/23/2020 Mammogram   Diangostic Mammogram 01/23/20  IMPRESSION: 1. There is a highly suspicious mass in the right breast at 6 o'clock 3 cm from the nipple demonstrates an irregular hypoechoic shadowing mass measuring 2.3 x 1.4 x 1.7 cm associated with pleomorphic calcifications.   2.  No evidence of right axillary lymphadenopathy.   3. There is a benign-appearing cyst in the superior left breast at 12:30, 3 cm from the nipple demonstrates an anechoic oval  circumscribed mass measuring 0.9 x 0.8 x 0.9 cm. No evidence of left breast malignancy.    01/31/2020 Cancer Staging   Staging form: Breast, AJCC 8th Edition - Clinical stage from 01/31/2020: Stage IB (cT2, cN0, cM0, G2, ER+, PR+, HER2-) - Signed by Truitt Merle, MD on 02/07/2020   01/31/2020 Initial Biopsy   Diagnosis 01/31/20 Breast, right, needle core biopsy, 6 o'clock - INVASIVE MAMMARY CARCINOMA AND MAMMARY CARCINOMA IN SITU WITH PERINEURAL INVASION AND CALCIFICATIONS. Microscopic Comment The carcinoma is nuclear grade 2. The greatest linear extent of tumor in any one core is 16 mm. Immunohistochemistry of E-cadherin will be reported separately. Ancillary studies will be reported separately.   01/31/2020 Receptors her2   PROGNOSTIC INDICATORS Results: IMMUNOHISTOCHEMICAL AND MORPHOMETRIC ANALYSIS PERFORMED MANUALLY The tumor cells are NEGATIVE for Her2 (1+). Estrogen Receptor: 100%, POSITIVE, STRONG STAINING INTENSITY Progesterone Receptor: 80%, POSITIVE, STRONG STAINING INTENSITY Proliferation Marker Ki67: 15%   02/02/2020 Initial Diagnosis   Malignant neoplasm of lower-outer quadrant of right breast of female, estrogen receptor positive ([place])      HISTORY OF PRESENTING ILLNESS:  Jean Morgan 59 y.o. female is a here because of newly diagnosed right breast cancer. The patient presents to the clinic today accompanied by her husband.   She had b/l breast pain for 6 weeks without known etiology. She had SOB, HA and milk let down feeling. She had not had Mammogram in 2 years before  this one. Scan showed breast mass. Her breast mass was not palpable but she noted mold skin retraction. She notes still have pulling sensation of both breasts. She notes stable weight and adequate eating. She notes she is anxious and depressed about her diagnosis as she is overwhelmed.   Socially she is married. She has 7 glasses of wine a week. She is a non smoker. She is a Theme park manager for seniors. She  notes she is interested in alternative medicine outside of standard cancer treatment.  They have a PMHx of B12 deficiency which causes fatigue. She notes left knee pain when flexing her knee. 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 She Had issues with her Hypothalamus, with right lobe of thyroid enlargement seen on [place] in 12/2019, likely benign. Her great aunt and her grandmother had breast cancer.      GYN HISTORY  Menarchal: 14 LMP: Many years ago Contraceptive: 7-8 years HRT: No G1P1: first at age 82    REVIEW OF SYSTEMS:    Constitutional: Denies fevers, chills or abnormal night sweats Eyes: Denies blurriness of vision, double vision or watery eyes Ears, nose, mouth, throat, and face: Denies mucositis or sore throat Respiratory: Denies cough, dyspnea or wheezes Cardiovascular: Denies palpitation, chest discomfort or lower extremity swelling Gastrointestinal:  Denies nausea, heartburn or change in bowel habits Skin: Denies abnormal skin rashes Lymphatics: Denies new lymphadenopathy or easy bruising Neurological:Denies numbness, tingling or new weaknesses Behavioral/Psych: Mood is stable, no new changes (+) Depression/Anxiety  Breast: b/l breast pulling  All other systems were reviewed with the patient and are negative.   MEDICAL HISTORY:  Past Medical History:  Diagnosis Date   Cancer ([place])    skin   Hypothyroidism     SURGICAL HISTORY: Past Surgical History:  Procedure Laterality Date   DENTAL SURGERY     TONSILLECTOMY      SOCIAL HISTORY: Social History   Socioeconomic History   Marital status: Married    Spouse name: Not on file   Number of children: 1   Years of education: Not on file   Highest education level: Not on file  Occupational History   Not on file  Tobacco Use   Smoking status: Never Smoker   Smokeless tobacco: Never Used  Substance and Sexual Activity   Alcohol use: Yes     Alcohol/week: 7.0 standard drinks    Types: 7 Glasses of wine per week   Drug use: No   Sexual activity: Not on file  Other Topics Concern   Not on file  Social History Narrative   Not on file   Social Determinants of Health   Financial Resource Strain:    Difficulty of Paying Living Expenses:   Food Insecurity:    Worried About Charity fundraiser in the Last Year:    Arboriculturist in the Last Year:   Transportation Needs:    Film/video editor (Medical):    Lack of Transportation (Non-Medical):   Physical Activity:    Days of Exercise per Week:    Minutes of Exercise per Session:   Stress:    Feeling of Stress :   Social Connections:    Frequency of Communication with Friends and Family:    Frequency of Social Gatherings with Friends and Family:    Attends Religious Services:    Active Member of Clubs or Organizations:    Attends Archivist Meetings:  Marital Status:   Intimate Production manager Violence:    Fear of Current or Ex-Partner:    Emotionally Abused:    Physically Abused:    Sexually Abused:     FAMILY HISTORY: Family History  Problem Relation Age of Onset   Colon polyps Mother    Colitis Mother    Arthritis Mother    Hyperlipidemia Mother    Diabetes Mother    Heart disease Father    Hyperlipidemia Father    Hypertension Father    Colon cancer Paternal Aunt    Diabetes Maternal Grandmother    Breast cancer Maternal Grandmother    Breast cancer Other     ALLERGIES:  is allergic to latex and oxycodone.  MEDICATIONS:  Current Outpatient Medications  Medication Sig Dispense Refill   Ascorbic Acid (VITAMIN C ER PO) Take by mouth.     cetirizine (ZYRTEC) 10 MG tablet Take 1 tablet (10 mg total) by mouth daily. 30 tablet 11   Cholecalciferol (VITAMIN D PO) Take by mouth as needed.      diazepam (VALIUM) 5 MG tablet Take 0.5-1 tablets (2.5-5 mg total) by mouth every 12 (twelve) hours as needed for  anxiety. Take 1-2 tabs 15 min before procedures. 40 tablet 1   GARLIC PO Take by mouth.     valACYclovir (VALTREX) 1000 MG tablet Start ASAP after symptoms onset and within 48 hours:1 tab daily for 5 days prn. 60 tablet 0   No current facility-administered medications for this visit.    PHYSICAL EXAMINATION: ECOG PERFORMANCE STATUS: 0 - Asymptomatic  Vitals:   02/08/20 1247  BP: (!) 165/91  Pulse: 98  Resp: 18  Temp: 98 F (36.7 C)  SpO2: 100%   Filed Weights   02/08/20 1247  Weight: 149 lb 3.2 oz (67.7 kg)    GENERAL:alert, no distress and comfortable SKIN: skin color, texture, turgor are normal, no rashes or significant lesions EYES: normal, Conjunctiva are pink and non-injected, sclera clear  NECK: supple, thyroid normal size, non-tender, without nodularity LYMPH:  no palpable lymphadenopathy in the cervical, axillary  LUNGS: clear to auscultation and percussion with normal breathing effort HEART: regular rate & rhythm and no murmurs and no lower extremity edema ABDOMEN:abdomen soft, non-tender and normal bowel sounds Musculoskeletal:no cyanosis of digits and no clubbing  NEURO: alert & oriented x 3 with fluent speech, no focal motor/sensory deficits BREAST: (+) Moderate skin ecchymosis at biopsy site with 1x1.5cm lump at 6:00 position of right breast. Left Breast exam benign.  LABORATORY DATA:  I have reviewed the data as listed CBC Latest Ref Rng & Units 02/08/2020 12/29/2019 08/18/2018  WBC 4.0 - 10.5 K/uL 9.2 7.3 8.4  Hemoglobin 12.0 - 15.0 g/dL 12.8 12.8 13.5  Hematocrit 36.0 - 46.0 % 39.5 37.9 39.8  Platelets 150 - 400 K/uL 286 279.0 272.0    CMP Latest Ref Rng & Units 02/08/2020 12/29/2019 08/18/2018  Glucose 70 - 99 mg/dL 113(H) 92 98  BUN 6 - 20 mg/dL _0 Creatinine 0.44 - 1.00 mg/dL 0.82 0.77 0.75  Sodium 135 - 145 mmol/L 142 141 140  Potassium 3.5 - 5.1 mmol/L 4.1 3.8 3.6  Chloride 98 - 111 mmol/L 108 104 102  CO2 22 - 32 mmol/L _1 Calcium  8.9 - 10.3 mg/dL 9.4 9.6 9.7  Total Protein 6.5 - 8.1 g/dL 7.5 7.0 7.7  Total Bilirubin 0.3 - 1.2 mg/dL 0.4 0.5 0.7  Alkaline Phos 38 - 126 U/L 85 79 79  AST 15 - 41 U/L _0 ALT 0 - 44 U/L _1 RADIOGRAPHIC STUDIES: I have personally reviewed the radiological images as listed and agreed with the findings in the report. DEXAScan  Result Date: 01/23/2020 EXAM: DUAL X-RAY ABSORPTIOMETRY (DXA) FOR BONE MINERAL DENSITY IMPRESSION: Referring Physician:  [name] [name] Your patient completed a BMD test using Lunar IDXA DXA system ( analysis version: 16 ) manufactured by [employer]. Technologist: AW PATIENT: Name: Konner, Warrior Patient ID: [id number] Birth Date: Sep 02, 1961 Height: 65.0 in. Sex: Female Measured: 01/23/2020 Weight: 149.8 lbs. Indications: Caucasian, Estrogen Deficient, Family History of Osteoporosis, Postmenopausal Fractures: None Treatments: Vitamin D (E933.5) ASSESSMENT: The BMD measured at Femur Neck Left is 0.815 g/cm2 with a T-score of -1.6. This patient is considered osteopenic according to [facility]) criteria. The scan quality is good. L-3 was excluded due to degenerative changes. Site Region Measured Date Measured Age YA BMD Significant CHANGE T-score DualFemur Neck Left  01/23/2020    60.1         -1.6    0.815 g/cm2 AP Spine  L1-L4 (L3) 01/23/2020    60.1         -1.1    1.035 g/cm2 DualFemur Total Mean 01/23/2020    60.1         -1.2    0.851 g/cm2 World Health Organization [facility]) criteria for post-menopausal, Caucasian Women: Normal       T-score at or above -1 SD Osteopenia   T-score between -1 and -2.5 SD Osteoporosis T-score at or below -2.5 SD RECOMMENDATION: 1. All patients should optimize calcium and vitamin D intake. 2. Consider FDA approved medical therapies in postmenopausal women and men aged 68 years and older, based on the following: a. A hip or vertebral (clinical or morphometric) fracture b. T- score < or = -2.5 at the femoral neck or  spine after appropriate evaluation to exclude secondary causes c. Low bone mass (T-score between -1.0 and -2.5 at the femoral neck or spine) and a 10 year probability of a hip fracture > or = 3% or a 10 year probability of a major osteoporosis-related fracture > or = 20% based on the [place]-adapted WHO algorithm d. Clinician judgment and/or patient preferences may indicate treatment for people with 10-year fracture probabilities above or below these levels FOLLOW-UP: People with diagnosed cases of osteoporosis or at high risk for fracture should have regular bone mineral density tests. For patients eligible for Medicare, routine testing is allowed once every 2 years. The testing frequency can be increased to one year for patients who have rapidly progressing disease, those who are receiving or discontinuing medical therapy to restore bone mass, or have additional risk factors. I have reviewed this report and agree with the above findings. [facility] FRAX* 10-year Probability of Fracture Based on femoral neck BMD: DualFemur (Left) Major Osteoporotic Fracture: 8.5% Hip Fracture:                0.8% Population:                  [place] (Caucasian) Risk Factors:                None *FRAX is a Materials engineer of the [employer] for Metabolic Bone Disease, a World Pharmacologist (WHO) [facility]. ASSESSMENT: The probability of a major osteoporotic fracture is 8.5 % within the next ten years. The probability of a hip  fracture is 0.8 % within the next ten years. Electronically Signed   By: Drew  Davis M.D.   On: 01/23/2020 08:07  ° °US BREAST LTD UNI LEFT INC AXILLA ° °Result Date: 01/23/2020 °CLINICAL DATA:  59-year-old female presenting for evaluation of bilateral diffuse breast pain for 6 weeks. The patient has history of melanoma and has had a right axillary lymph node dissection in 2005. EXAM: DIGITAL DIAGNOSTIC BILATERAL MAMMOGRAM WITH CAD AND TOMO ULTRASOUND BILATERAL  BREAST COMPARISON:  Previous exam(s). ACR Breast Density Category c: The breast tissue is heterogeneously dense, which may obscure small masses. FINDINGS: Spot compression magnification images through the inferior aspect of the right breast demonstrates a 2.0 cm group of pleomorphic calcifications. Associated with these calcifications is a spiculated distorted mass. In the superior left breast there is an obscured oval mass measuring approximately 8 mm. Mammographic images were processed with CAD. Ultrasound targeted to the right breast at 6 o'clock, 3 cm from the nipple demonstrates an irregular hypoechoic shadowing mass measuring 2.3 x 1.4 x 1.7 cm. The mass abuts the underlying musculature, and muscle involvement cannot be excluded. Ultrasound of the right axilla demonstrates multiple normal-appearing lymph nodes. Ultrasound of the left breast at 12:30, 3 cm from the nipple demonstrates an anechoic oval circumscribed mass measuring 0.9 x 0.8 x 0.9 cm. IMPRESSION: 1. There is a highly suspicious mass in the right breast at 6 o'clock associated with pleomorphic calcifications. 2.  No evidence of right axillary lymphadenopathy. 3. There is a benign-appearing cyst in the superior left breast. No evidence of left breast malignancy. RECOMMENDATION: 1. Ultrasound guided biopsy is recommended for the right breast mass, and has been scheduled for 01/31/2020 at 7:30 a.m. The specimen removed can be x-ray to ensure calcifications are seen in the biopsy sample. 2. If the pathology results are malignant, MRI should be considered to exclude muscle involvement. I have discussed the findings and recommendations with the patient. If applicable, a reminder letter will be sent to the patient regarding the next appointment. BI-RADS CATEGORY  5: Highly suggestive of malignancy. Electronically Signed   By: Michelle  Collins M.D.   On: 01/23/2020 09:07  ° °US BREAST LTD UNI RIGHT INC AXILLA ° °Result Date: 01/23/2020 °CLINICAL DATA:   59-year-old female presenting for evaluation of bilateral diffuse breast pain for 6 weeks. The patient has history of melanoma and has had a right axillary lymph node dissection in 2005. EXAM: DIGITAL DIAGNOSTIC BILATERAL MAMMOGRAM WITH CAD AND TOMO ULTRASOUND BILATERAL BREAST COMPARISON:  Previous exam(s). ACR Breast Density Category c: The breast tissue is heterogeneously dense, which may obscure small masses. FINDINGS: Spot compression magnification images through the inferior aspect of the right breast demonstrates a 2.0 cm group of pleomorphic calcifications. Associated with these calcifications is a spiculated distorted mass. In the superior left breast there is an obscured oval mass measuring approximately 8 mm. Mammographic images were processed with CAD. Ultrasound targeted to the right breast at 6 o'clock, 3 cm from the nipple demonstrates an irregular hypoechoic shadowing mass measuring 2.3 x 1.4 x 1.7 cm. The mass abuts the underlying musculature, and muscle involvement cannot be excluded. Ultrasound of the right axilla demonstrates multiple normal-appearing lymph nodes. Ultrasound of the left breast at 12:30, 3 cm from the nipple demonstrates an anechoic oval circumscribed mass measuring 0.9 x 0.8 x 0.9 cm. IMPRESSION: 1. There is a highly suspicious mass in the right breast at 6 o'clock associated with pleomorphic calcifications. 2.  No evidence of right axillary lymphadenopathy.   3. There is a benign-appearing cyst in the superior left breast. No evidence of left breast malignancy. RECOMMENDATION: 1. Ultrasound guided biopsy is recommended for the right breast mass, and has been scheduled for 01/31/2020 at 7:30 a.m. The specimen removed can be x-ray to ensure calcifications are seen in the biopsy sample. 2. If the pathology results are malignant, MRI should be considered to exclude muscle involvement. I have discussed the findings and recommendations with the patient. If applicable, a reminder letter  will be sent to the patient regarding the next appointment. BI-RADS CATEGORY  5: Highly suggestive of malignancy. Electronically Signed   By: Michelle  Collins M.D.   On: 01/23/2020 09:07  ° °MM DIAG BREAST TOMO BILATERAL ° °Result Date: 01/23/2020 °CLINICAL DATA:  59-year-old female presenting for evaluation of bilateral diffuse breast pain for 6 weeks. The patient has history of melanoma and has had a right axillary lymph node dissection in 2005. EXAM: DIGITAL DIAGNOSTIC BILATERAL MAMMOGRAM WITH CAD AND TOMO ULTRASOUND BILATERAL BREAST COMPARISON:  Previous exam(s). ACR Breast Density Category c: The breast tissue is heterogeneously dense, which may obscure small masses. FINDINGS: Spot compression magnification images through the inferior aspect of the right breast demonstrates a 2.0 cm group of pleomorphic calcifications. Associated with these calcifications is a spiculated distorted mass. In the superior left breast there is an obscured oval mass measuring approximately 8 mm. Mammographic images were processed with CAD. Ultrasound targeted to the right breast at 6 o'clock, 3 cm from the nipple demonstrates an irregular hypoechoic shadowing mass measuring 2.3 x 1.4 x 1.7 cm. The mass abuts the underlying musculature, and muscle involvement cannot be excluded. Ultrasound of the right axilla demonstrates multiple normal-appearing lymph nodes. Ultrasound of the left breast at 12:30, 3 cm from the nipple demonstrates an anechoic oval circumscribed mass measuring 0.9 x 0.8 x 0.9 cm. IMPRESSION: 1. There is a highly suspicious mass in the right breast at 6 o'clock associated with pleomorphic calcifications. 2.  No evidence of right axillary lymphadenopathy. 3. There is a benign-appearing cyst in the superior left breast. No evidence of left breast malignancy. RECOMMENDATION: 1. Ultrasound guided biopsy is recommended for the right breast mass, and has been scheduled for 01/31/2020 at 7:30 a.m. The specimen removed can  be x-ray to ensure calcifications are seen in the biopsy sample. 2. If the pathology results are malignant, MRI should be considered to exclude muscle involvement. I have discussed the findings and recommendations with the patient. If applicable, a reminder letter will be sent to the patient regarding the next appointment. BI-RADS CATEGORY  5: Highly suggestive of malignancy. Electronically Signed   By: Michelle  Collins M.D.   On: 01/23/2020 09:07  ° °MM CLIP PLACEMENT RIGHT ° °Result Date: 01/31/2020 °CLINICAL DATA:  Post biopsy mammogram of the right breast for clip placement, and specimen radiograph for calcifications. EXAM: DIAGNOSTIC RIGHT MAMMOGRAM POST ULTRASOUND BIOPSY COMPARISON:  Previous exam(s). FINDINGS: Mammographic images were obtained following ultrasound guided biopsy of a right breast mass at 6 o'clock. The biopsy marking clip is in expected position at the site of biopsy. No definite calcifications are seen with in the 5 core samples removed. IMPRESSION: Appropriate positioning of the ribbon shaped biopsy marking clip at the site of biopsy in the inferior right breast. Final Assessment: Post Procedure Mammograms for Marker Placement Electronically Signed   By: Michelle  Collins M.D.   On: 01/31/2020 08:51  ° °US RT BREAST BX W LOC DEV 1ST LESION IMG BX SPEC US GUIDE ° °  Addendum Date: 02/01/2020   °ADDENDUM REPORT: 02/01/2020 11:22 ADDENDUM: Pathology revealed GRADE II INVASIVE MAMMARY CARCINOMA AND MAMMARY CARCINOMA IN SITU WITH PERINEURAL INVASION AND CALCIFICATIONS of the RIGHT breast, 6 o'clock. This was found to be concordant by Dr. Michelle Collins. Pathology results were discussed with the patient by telephone. The patient reported doing well after the biopsy with tenderness at the site. Post biopsy instructions and care were reviewed and questions were answered. The patient was encouraged to call [facility] for any additional concerns. The patient was referred to  [facility] on February 08, 2020. Recommend bilateral breast MRI to evaluate for muscle invasion. Pathology results reported by Susan Eaton RN on 02/01/2020. Electronically Signed   By: Michelle  Collins M.D.   On: 02/01/2020 11:22  ° °Result Date: 02/01/2020 °CLINICAL DATA:  59-year-old female presenting for ultrasound-guided biopsy of a right breast mass. EXAM: ULTRASOUND GUIDED RIGHT BREAST CORE NEEDLE BIOPSY COMPARISON:  Previous exam(s). PROCEDURE: I met with the patient and we discussed the procedure of ultrasound-guided biopsy, including benefits and alternatives. We discussed the high likelihood of a successful procedure. We discussed the risks of the procedure, including infection, bleeding, tissue injury, clip migration, and inadequate sampling. Informed written consent was given. The usual time-out protocol was performed immediately prior to the procedure. Lesion quadrant: Lower inner quadrant Using sterile technique and 1% Lidocaine as local anesthetic, under direct ultrasound visualization, a 14 gauge spring-loaded device was used to perform biopsy of a mass in the right breast at 6 o'clock using an inferior approach. At the conclusion of the procedure ribbon shaped tissue marker clip was deployed into the biopsy cavity. Follow up 2 view mammogram was performed and dictated separately. IMPRESSION: Ultrasound guided biopsy of a mass in the right breast at 6 o'clock. No apparent complications. Electronically Signed: By: Michelle  Collins M.D. On: 01/31/2020 08:51  ° ° °ASSESSMENT & PLAN:  °Shameka Mossbarger is a 59 y.o. Caucasian female with a history of H/o skin cancer and hypothyroidism  ° °1. 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 This mass is deep in breast with possible invasion to chest wall muscle.  °-Given the early stage disease, she likely need a lumpectomy. She is agreeable with that. She was seen by Dr. Toth today and likely will proceed with surgery.   °-we recommend breast MRI to further evaluation to muscular invasion from her breast cancer. I discussed if her breast mass is invading muscle she may need neoadjuvant treatment prior to proceeding to surgery.   °-I recommend a Oncotype Dx test on biopsy tissue sample and we'll make a decision about neoadjuvant or adjuvant chemotherapy based on the Oncotype result. Written material of this test was given to her. She is young and fit, would be a good candidate for chemotherapy if her Oncotype recurrence score is high. °-The risk of recurrence depends on the stage and biology of the tumor. She is early stage, with ER/PR positive and HER2 negative markers. I discussed this is the more common type of slow growing tumor.  °-She was also seen by radiation oncologist Dr. Moody today. If her surgical sentinel lymph nodes were negative, she would not need post mastectomy radiation. Otherwise radiation is recommended to reduce the   risk for local recurrence.  °-Given the strong ER and PR expression in her postmenopausal status, I recommend adjuvant endocrine therapy with aromatase inhibitor with anastrozole or Letrozole for a total of 5-10 years to reduce the risk of cancer recurrence. Potential benefits and side effects were discussed with patient and she is interested. If her bone density worsens on AI may switch her to Tamoxifen.  °-We also discussed the breast cancer surveillance after her surgery. She will continue annual screening mammogram, self exam, and a routine office visit with lab and exam with us. °-I encouraged her to have healthy diet and exercise regularly, take multivitamin and maintain positive mind set.  °-Physical exam shows 1x1.5cm right breast mass at  biopsy site, possible bleeding. Labs reviewed, CBC and CMP WNL except BG 113.  °-I encouraged her to proceed with COVID19 vaccine. I advised she get shot in her left arm.  °-F/u open  ° ° °2. 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 °-She had Malignant Melanoma in 2006 of right forearm. S/p removal.  °-She has 1.4cm right superior TR 2 nodule seen on US in 01/07/2020, likely benign.  °-She has been on Lexapro for depression before but did not tolerate. She was given Celexa 10mg but not started yet, she is fine to start now. Will watch mood on AI. I discussed Celexa can help her AI side effects as well.  °-She notes she has been anxious about diagnosis, she has not told her daughter yet. I discussed she can talk to with our SW about this. She would like to talk to SW today.  ° ° °3. Genetic Testing  °-Her great aunt and her grandmother had breast cancer.  She is eligible for genetic testing. She is agreeable, will proceed with today.  ° ° °4. Osteopenia °-She was recently diagnosed by 12/2019 DEXA.  °-Will monitor on AI which can weaken her bone.  °-She plans to start Calcium and Vit D soon.  ° ° °PLAN:  °-Breast MRI in 1-2 weeks  °-Proceed with genetic testing  °-Send SW referral for anxiety and depression, I encourage her to start Celexa which has been prescribed by her PCP  °-will send her biopsy sample out for Oncotype to see if she needs neoadjuvant chemo °-she will think about if she wants to start AI while she is waiting for above test results and surgery  °-F/u open, depends on her Oncotype result  ° °No orders of the defined types were placed in this encounter. ° ° °All questions were answered. The patient knows to call the clinic with any problems, questions or concerns. °The total time spent in the appointment was 60 minutes. ° °  ° Yan Feng, MD °02/08/2020 4:50 PM ° °I, Amoya Bennett, am acting as scribe for  Yan Feng, MD.  ° °I have reviewed the above documentation for accuracy and completeness, and I agree with the above. °  ° ° °

## 2020-02-08 ENCOUNTER — Telehealth: Payer: Self-pay | Admitting: *Deleted

## 2020-02-08 ENCOUNTER — Ambulatory Visit
Admission: RE | Admit: 2020-02-08 | Discharge: 2020-02-08 | Disposition: A | Discharge status: Home / Self Care | Payer: BC Managed Care – PPO | Source: Ambulatory Visit | Attending: Radiation Oncology | Admitting: Radiation Oncology

## 2020-02-08 ENCOUNTER — Other Ambulatory Visit: Payer: Self-pay

## 2020-02-08 ENCOUNTER — Encounter: Payer: Self-pay | Admitting: Licensed Clinical Social Worker

## 2020-02-08 ENCOUNTER — Ambulatory Visit (HOSPITAL_BASED_OUTPATIENT_CLINIC_OR_DEPARTMENT_OTHER): Payer: BC Managed Care – PPO | Admitting: Genetic Counselor

## 2020-02-08 ENCOUNTER — Inpatient Hospital Stay: Payer: BC Managed Care – PPO

## 2020-02-08 ENCOUNTER — Inpatient Hospital Stay: Payer: BC Managed Care – PPO | Attending: Hematology | Admitting: Hematology

## 2020-02-08 ENCOUNTER — Encounter: Payer: Self-pay | Admitting: Hematology

## 2020-02-08 ENCOUNTER — Other Ambulatory Visit: Payer: Self-pay | Admitting: *Deleted

## 2020-02-08 ENCOUNTER — Encounter: Payer: Self-pay | Admitting: *Deleted

## 2020-02-08 VITALS — BP 165/91 | HR 98 | Temp 98.0°F | Resp 18 | Ht 66.0 in | Wt 149.2 lb

## 2020-02-08 DIAGNOSIS — Z8249 Family history of ischemic heart disease and other diseases of the circulatory system: Secondary | ICD-10-CM | POA: Diagnosis not present

## 2020-02-08 DIAGNOSIS — C50511 Malignant neoplasm of lower-outer quadrant of right female breast: Secondary | ICD-10-CM | POA: Diagnosis present

## 2020-02-08 DIAGNOSIS — M25562 Pain in left knee: Secondary | ICD-10-CM | POA: Diagnosis not present

## 2020-02-08 DIAGNOSIS — Z170 Estrogen receptor positive status [ER+]: Secondary | ICD-10-CM

## 2020-02-08 DIAGNOSIS — Z85820 Personal history of malignant melanoma of skin: Secondary | ICD-10-CM | POA: Diagnosis not present

## 2020-02-08 DIAGNOSIS — R0602 Shortness of breath: Secondary | ICD-10-CM | POA: Insufficient documentation

## 2020-02-08 DIAGNOSIS — Z8042 Family history of malignant neoplasm of prostate: Secondary | ICD-10-CM

## 2020-02-08 DIAGNOSIS — Z7289 Other problems related to lifestyle: Secondary | ICD-10-CM | POA: Diagnosis not present

## 2020-02-08 DIAGNOSIS — Z8379 Family history of other diseases of the digestive system: Secondary | ICD-10-CM | POA: Insufficient documentation

## 2020-02-08 DIAGNOSIS — R519 Headache, unspecified: Secondary | ICD-10-CM | POA: Diagnosis not present

## 2020-02-08 DIAGNOSIS — N644 Mastodynia: Secondary | ICD-10-CM | POA: Diagnosis not present

## 2020-02-08 DIAGNOSIS — Z808 Family history of malignant neoplasm of other organs or systems: Secondary | ICD-10-CM

## 2020-02-08 DIAGNOSIS — Z885 Allergy status to narcotic agent status: Secondary | ICD-10-CM | POA: Diagnosis not present

## 2020-02-08 DIAGNOSIS — N6002 Solitary cyst of left breast: Secondary | ICD-10-CM | POA: Diagnosis not present

## 2020-02-08 DIAGNOSIS — Z8371 Family history of colonic polyps: Secondary | ICD-10-CM | POA: Insufficient documentation

## 2020-02-08 DIAGNOSIS — F419 Anxiety disorder, unspecified: Secondary | ICD-10-CM | POA: Diagnosis not present

## 2020-02-08 DIAGNOSIS — Z8041 Family history of malignant neoplasm of ovary: Secondary | ICD-10-CM

## 2020-02-08 DIAGNOSIS — M8580 Other specified disorders of bone density and structure, unspecified site: Secondary | ICD-10-CM | POA: Insufficient documentation

## 2020-02-08 DIAGNOSIS — Z803 Family history of malignant neoplasm of breast: Secondary | ICD-10-CM | POA: Insufficient documentation

## 2020-02-08 DIAGNOSIS — Z79899 Other long term (current) drug therapy: Secondary | ICD-10-CM | POA: Insufficient documentation

## 2020-02-08 DIAGNOSIS — Z800 Family history of malignant neoplasm of digestive organs: Secondary | ICD-10-CM | POA: Diagnosis not present

## 2020-02-08 DIAGNOSIS — Z8349 Family history of other endocrine, nutritional and metabolic diseases: Secondary | ICD-10-CM | POA: Insufficient documentation

## 2020-02-08 DIAGNOSIS — Z806 Family history of leukemia: Secondary | ICD-10-CM

## 2020-02-08 DIAGNOSIS — Z596 Low income: Secondary | ICD-10-CM | POA: Diagnosis not present

## 2020-02-08 DIAGNOSIS — Z8261 Family history of arthritis: Secondary | ICD-10-CM | POA: Diagnosis not present

## 2020-02-08 DIAGNOSIS — Z833 Family history of diabetes mellitus: Secondary | ICD-10-CM | POA: Insufficient documentation

## 2020-02-08 DIAGNOSIS — F329 Major depressive disorder, single episode, unspecified: Secondary | ICD-10-CM | POA: Diagnosis not present

## 2020-02-08 DIAGNOSIS — E039 Hypothyroidism, unspecified: Secondary | ICD-10-CM | POA: Insufficient documentation

## 2020-02-08 LAB — CMP (CANCER CENTER ONLY)
ALT: 19 U/L (ref 0–44)
AST: 20 U/L (ref 15–41)
Albumin: 4.3 g/dL (ref 3.5–5.0)
Alkaline Phosphatase: 85 U/L (ref 38–126)
Anion gap: 9 (ref 5–15)
BUN: 11 mg/dL (ref 6–20)
CO2: 25 mmol/L (ref 22–32)
Calcium: 9.4 mg/dL (ref 8.9–10.3)
Chloride: 108 mmol/L (ref 98–111)
Creatinine: 0.82 mg/dL (ref 0.44–1.00)
GFR, Est AFR Am: >60 mL/min (ref >60)
GFR, Estimated: >60 mL/min (ref >60)
Glucose, Bld: 113 mg/dL — ABNORMAL HIGH (ref 70–99)
Potassium: 4.1 mmol/L (ref 3.5–5.1)
Sodium: 142 mmol/L (ref 135–145)
Total Bilirubin: 0.4 mg/dL (ref 0.3–1.2)
Total Protein: 7.5 g/dL (ref 6.5–8.1)

## 2020-02-08 LAB — CBC WITH DIFFERENTIAL (CANCER CENTER ONLY)
Abs Immature Granulocytes: 0.02 10*3/uL (ref 0.00–0.07)
Basophils Absolute: 0.1 10*3/uL (ref 0.0–0.1)
Basophils Relative: 1 %
Eosinophils Absolute: 0.2 10*3/uL (ref 0.0–0.5)
Eosinophils Relative: 2 %
HCT: 39.5 % (ref 36.0–46.0)
Hemoglobin: 12.8 g/dL (ref 12.0–15.0)
Immature Granulocytes: 0 %
Lymphocytes Relative: 33 %
Lymphs Abs: 3.1 10*3/uL (ref 0.7–4.0)
MCH: 30.3 pg (ref 26.0–34.0)
MCHC: 32.4 g/dL (ref 30.0–36.0)
MCV: 93.6 fL (ref 80.0–100.0)
Monocytes Absolute: 0.4 10*3/uL (ref 0.1–1.0)
Monocytes Relative: 4 %
Neutro Abs: 5.5 10*3/uL (ref 1.7–7.7)
Neutrophils Relative %: 60 %
Platelet Count: 286 10*3/uL (ref 150–400)
RBC: 4.22 MIL/uL (ref 3.87–5.11)
RDW: 12.9 % (ref 11.5–15.5)
WBC Count: 9.2 10*3/uL (ref 4.0–10.5)
nRBC: 0 % (ref 0.0–0.2)

## 2020-02-08 LAB — GENETIC SCREENING ORDER

## 2020-02-08 MED ORDER — LORAZEPAM 0.5 MG PO TABS: 0.5000 mg | ORAL_TABLET | Freq: Once | ORAL | 0 refills | Status: DC

## 2020-02-08 NOTE — Telephone Encounter (Signed)
Received order for oncotype testing on CORE bx. Requisition faxed to pathology and [facility]

## 2020-02-08 NOTE — Progress Notes (Signed)
Clinical Social Work [place] Psychosocial Distress Screening [place]   Patient completed distress screening protocol and scored a 7 on the Psychosocial Distress Thermometer which indicates moderate distress. Clinical Social Worker met with patient and patient's husband, Jacqulyn Bath, in [facility] to assess for distress and other psychosocial needs.  Patient stated she was feeling overwhelmed. She feels better after receiving more information but is still anxious as she typically likes to use more natural and alternative therapies. She is also anxious about telling her daughter who is in college in [place].  Patient has strong support from her husband. She also has a therapist.   CSW and patient discussed common feeling and emotions when being diagnosed with cancer, and the importance of support during treatment.  CSW informed patient of the support team and support services at [facility].  CSW provided contact information and encouraged patient to call with any questions or concerns.    Distress Screen: ONCBCN DISTRESS SCREENING 02/08/2020  Screening Type Initial Screening  Distress experienced in past week (1-10) 7  Practical problem type Work/school  Family Problem type Partner;Children  Emotional problem type Nervousness/Anxiety;Adjusting to illness;Isolation/feeling alone  Information Concerns Type Lack of info about treatment;Lack of info about complementary therapy choices;Lack of info about maintaining fitness  Physical Problem type Sexual problems;Skin dry/itchy     [place]

## 2020-02-08 NOTE — Progress Notes (Signed)
Radiation Oncology         [phone]) [phone] ________________________________  Name: Jean Morgan        MRN: [id number]  Date of Service: 02/08/2020 DOB: 29-Dec-1960  GE:XBMWUX, Malka So, MD  [name], MD     REFERRING PHYSICIAN: Autumn Messing III, MD   DIAGNOSIS: The encounter diagnosis was Malignant neoplasm of lower-outer quadrant of right breast of female, estrogen receptor positive ([place]).   HISTORY OF PRESENT ILLNESS: [name] is a 59 y.o. female seen in the multidisciplinary breast clinic for a new diagnosis of right breast cancer. The patient was noted to have bilateral breast pain that prompted diagnostic imaging. She had further imaging of the right breast when an abnormality was noted. By ultrasound the area was at 6:00 and measured 2.3 x 1.7 x 1.4 cm and it was abutting the pectoralis muscle. Her axilla was negative for adenopathy.    PREVIOUS RADIATION THERAPY: No   PAST MEDICAL HISTORY:  Past Medical History:  Diagnosis Date  . Cancer ([place])    skin  . Hypothyroidism        PAST SURGICAL HISTORY: Past Surgical History:  Procedure Laterality Date  . DENTAL SURGERY    . TONSILLECTOMY       FAMILY HISTORY:  Family History  Problem Relation Age of Onset  . Colon polyps Mother   . Colitis Mother   . Arthritis Mother   . Hyperlipidemia Mother   . Diabetes Mother   . Heart disease Father   . Hyperlipidemia Father   . Hypertension Father   . Colon cancer Paternal Aunt   . Diabetes Maternal Grandmother   . Breast cancer Maternal Aunt      SOCIAL HISTORY:  reports that she has never smoked. She has never used smokeless tobacco. She reports current alcohol use of about 1.0 - 3.0 standard drinks of alcohol per week. She reports that she does not use drugs.  The patient is married and lives in [place]. 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 Her husband accompanies her and they have two adult children. Their son is in [place], and  their daughter is in [place] doing a study abroad program.  ALLERGIES: Latex and Oxycodone   MEDICATIONS:  Current Outpatient Medications  Medication Sig Dispense Refill  . Ascorbic Acid (VITAMIN C ER PO) Take by mouth.    . cetirizine (ZYRTEC) 10 MG tablet Take 1 tablet (10 mg total) by mouth daily. 30 tablet 11  . Cholecalciferol (VITAMIN D PO) Take by mouth as needed.     . citalopram (CELEXA) 10 MG tablet Take 1 tablet (10 mg total) by mouth daily. 30 tablet 1  . diazepam (VALIUM) 5 MG tablet Take 0.5-1 tablets (2.5-5 mg total) by mouth every 12 (twelve) hours as needed for anxiety. Take 1-2 tabs 15 min before procedures. 40 tablet 1  . GARLIC PO Take by mouth.    . valACYclovir (VALTREX) 1000 MG tablet Start ASAP after symptoms onset and within 48 hours:1 tab daily for 5 days prn. 60 tablet 0   No current facility-administered medications for this encounter.     REVIEW OF SYSTEMS: On review of systems, the patient reports that she is doing well overall. No specific complaints are verbalized.     PHYSICAL EXAM:  Wt Readings from Last 3 Encounters:  01/04/20 148 lb (67.1 kg)  08/18/18 140 lb 8 oz (63.7 kg)  02/10/18 134 lb (60.8 kg)   Temp Readings  from Last 3 Encounters:  04/14/19 98.6 F (37 C)  08/18/18 98.1 F (36.7 C)  02/10/18 98.2 F (36.8 C) (Oral)   BP Readings from Last 3 Encounters:  01/04/20 128/80  08/18/18 120/76  02/10/18 (!) 160/96   Pulse Readings from Last 3 Encounters:  01/04/20 92  08/18/18 79  02/10/18 95    In general this is a well appearing caucasian female in no acute distress. She's alert and oriented x4 and appropriate throughout the examination. Cardiopulmonary assessment is negative for acute distress and she exhibits normal effort. Bilateral breast exam is deferred.    ECOG = 0  0 - Asymptomatic (Fully active, able to carry on all predisease activities without restriction)  1 - Symptomatic but completely ambulatory (Restricted in  physically strenuous activity but ambulatory and able to carry out work of a light or sedentary nature. For example, light housework, office work)  2 - Symptomatic, <50% in bed during the day (Ambulatory and capable of all self care but unable to carry out any work activities. Up and about more than 50% of waking hours)  3 - Symptomatic, >50% in bed, but not bedbound (Capable of only limited self-care, confined to bed or chair 50% or more of waking hours)  4 - Bedbound (Completely disabled. Cannot carry on any self-care. Totally confined to bed or chair)  5 - Death   Jean Morgan, Creech RH, Tormey DC, et al. [phone]). "Toxicity and response criteria of the [facility]". [place] Oncol. 5 (6): 649-55    LABORATORY DATA:  Lab Results  Component Value Date   WBC 7.3 12/29/2019   HGB 12.8 12/29/2019   HCT 37.9 12/29/2019   MCV 92.4 12/29/2019   PLT 279.0 12/29/2019   Lab Results  Component Value Date   NA 141 12/29/2019   K 3.8 12/29/2019   CL 104 12/29/2019   CO2 28 12/29/2019   Lab Results  Component Value Date   ALT 16 12/29/2019   AST 17 12/29/2019   ALKPHOS 79 12/29/2019   BILITOT 0.5 12/29/2019      RADIOGRAPHY: DEXAScan  Result Date: 01/23/2020 EXAM: DUAL X-RAY ABSORPTIOMETRY (DXA) FOR BONE MINERAL DENSITY IMPRESSION: Referring Physician:  [name] [name] Your patient completed a BMD test using Lunar IDXA DXA system ( analysis version: 16 ) manufactured by [employer]. Technologist: AW PATIENT: Name: Daylan, Boggess Patient ID: [id number] Birth Date: [birth year] Height: 65.0 in. Sex: Female Measured: 01/23/2020 Weight: 149.8 lbs. Indications: Caucasian, Estrogen Deficient, Family History of Osteoporosis, Postmenopausal Fractures: None Treatments: Vitamin D (E933.5) ASSESSMENT: The BMD measured at Femur Neck Left is 0.815 g/cm2 with a T-score of -1.6. This patient is considered osteopenic according to [facility]) criteria. The scan  quality is good. L-3 was excluded due to degenerative changes. Site Region Measured Date Measured Age YA BMD Significant CHANGE T-score DualFemur Neck Left  01/23/2020    60.1         -1.6    0.815 g/cm2 AP Spine  L1-L4 (L3) 01/23/2020    60.1         -1.1    1.035 g/cm2 DualFemur Total Mean 01/23/2020    60.1         -1.2    0.851 g/cm2 World Health Organization [facility]) criteria for post-menopausal, Caucasian Women: Normal       T-score at or above -1 SD Osteopenia   T-score between -1 and -2.5 SD Osteoporosis T-score at or below -2.5 SD  RECOMMENDATION: 1. All patients should optimize calcium and vitamin D intake. 2. Consider FDA approved medical therapies in postmenopausal women and men aged 71 years and older, based on the following: a. A hip or vertebral (clinical or morphometric) fracture b. T- score < or = -2.5 at the femoral neck or spine after appropriate evaluation to exclude secondary causes c. Low bone mass (T-score between -1.0 and -2.5 at the femoral neck or spine) and a 10 year probability of a hip fracture > or = 3% or a 10 year probability of a major osteoporosis-related fracture > or = 20% based on the [place]-adapted WHO algorithm d. Clinician judgment and/or patient preferences may indicate treatment for people with 10-year fracture probabilities above or below these levels FOLLOW-UP: People with diagnosed cases of osteoporosis or at high risk for fracture should have regular bone mineral density tests. For patients eligible for Medicare, routine testing is allowed once every 2 years. The testing frequency can be increased to one year for patients who have rapidly progressing disease, those who are receiving or discontinuing medical therapy to restore bone mass, or have additional risk factors. I have reviewed this report and agree with the above findings. [place] [facility] FRAX* 10-year Probability of Fracture Based on femoral neck BMD: DualFemur (Left) Major Osteoporotic Fracture: 8.5% Hip Fracture:                 0.8% Population:                  [place] (Caucasian) Risk Factors:                None *FRAX is a Materials engineer of the [employer] for Metabolic Bone Disease, a World Pharmacologist (WHO) [facility]. ASSESSMENT: The probability of a major osteoporotic fracture is 8.5 % within the next ten years. The probability of a hip fracture is 0.8 % within the next ten years. Electronically Signed   By: Lovey Newcomer M.D.   On: 01/23/2020 08:07   US BREAST LTD UNI LEFT INC AXILLA  Result Date: 01/23/2020 CLINICAL DATA:  59 year old female presenting for evaluation of bilateral diffuse breast pain for 6 weeks. The patient has history of melanoma and has had a right axillary lymph node dissection in 2005. EXAM: DIGITAL DIAGNOSTIC BILATERAL MAMMOGRAM WITH CAD AND TOMO ULTRASOUND BILATERAL BREAST COMPARISON:  Previous exam(s). ACR Breast Density Category c: The breast tissue is heterogeneously dense, which may obscure small masses. FINDINGS: Spot compression magnification images through the inferior aspect of the right breast demonstrates a 2.0 cm group of pleomorphic calcifications. Associated with these calcifications is a spiculated distorted mass. In the superior left breast there is an obscured oval mass measuring approximately 8 Morgan. Mammographic images were processed with CAD. Ultrasound targeted to the right breast at 6 o'clock, 3 cm from the nipple demonstrates an irregular hypoechoic shadowing mass measuring 2.3 x 1.4 x 1.7 cm. The mass abuts the underlying musculature, and muscle involvement cannot be excluded. Ultrasound of the right axilla demonstrates multiple normal-appearing lymph nodes. Ultrasound of the left breast at 12:30, 3 cm from the nipple demonstrates an anechoic oval circumscribed mass measuring 0.9 x 0.8 x 0.9 cm. IMPRESSION: 1. There is a highly suspicious mass in the right breast at 6 o'clock associated with pleomorphic calcifications. 2.  No  evidence of right axillary lymphadenopathy. 3. There is a benign-appearing cyst in the superior left breast. No evidence of left breast malignancy. RECOMMENDATION: 1. Ultrasound guided biopsy  is recommended for the right breast mass, and has been scheduled for 01/31/2020 at 7:30 a.m. The specimen removed can be x-ray to ensure calcifications are seen in the biopsy sample. 2. If the pathology results are malignant, MRI should be considered to exclude muscle involvement. I have discussed the findings and recommendations with the patient. If applicable, a reminder letter will be sent to the patient regarding the next appointment. BI-RADS CATEGORY  5: Highly suggestive of malignancy. Electronically Signed   By: Ammie Ferrier M.D.   On: 01/23/2020 09:07   US BREAST LTD UNI RIGHT INC AXILLA  Result Date: 01/23/2020 CLINICAL DATA:  59 year old female presenting for evaluation of bilateral diffuse breast pain for 6 weeks. The patient has history of melanoma and has had a right axillary lymph node dissection in 2005. EXAM: DIGITAL DIAGNOSTIC BILATERAL MAMMOGRAM WITH CAD AND TOMO ULTRASOUND BILATERAL BREAST COMPARISON:  Previous exam(s). ACR Breast Density Category c: The breast tissue is heterogeneously dense, which may obscure small masses. FINDINGS: Spot compression magnification images through the inferior aspect of the right breast demonstrates a 2.0 cm group of pleomorphic calcifications. Associated with these calcifications is a spiculated distorted mass. In the superior left breast there is an obscured oval mass measuring approximately 8 Morgan. Mammographic images were processed with CAD. Ultrasound targeted to the right breast at 6 o'clock, 3 cm from the nipple demonstrates an irregular hypoechoic shadowing mass measuring 2.3 x 1.4 x 1.7 cm. The mass abuts the underlying musculature, and muscle involvement cannot be excluded. Ultrasound of the right axilla demonstrates multiple normal-appearing lymph nodes.  Ultrasound of the left breast at 12:30, 3 cm from the nipple demonstrates an anechoic oval circumscribed mass measuring 0.9 x 0.8 x 0.9 cm. IMPRESSION: 1. There is a highly suspicious mass in the right breast at 6 o'clock associated with pleomorphic calcifications. 2.  No evidence of right axillary lymphadenopathy. 3. There is a benign-appearing cyst in the superior left breast. No evidence of left breast malignancy. RECOMMENDATION: 1. Ultrasound guided biopsy is recommended for the right breast mass, and has been scheduled for 01/31/2020 at 7:30 a.m. The specimen removed can be x-ray to ensure calcifications are seen in the biopsy sample. 2. If the pathology results are malignant, MRI should be considered to exclude muscle involvement. I have discussed the findings and recommendations with the patient. If applicable, a reminder letter will be sent to the patient regarding the next appointment. BI-RADS CATEGORY  5: Highly suggestive of malignancy. Electronically Signed   By: Ammie Ferrier M.D.   On: 01/23/2020 09:07   Morgan DIAG BREAST TOMO BILATERAL  Result Date: 01/23/2020 CLINICAL DATA:  59 year old female presenting for evaluation of bilateral diffuse breast pain for 6 weeks. The patient has history of melanoma and has had a right axillary lymph node dissection in 2005. EXAM: DIGITAL DIAGNOSTIC BILATERAL MAMMOGRAM WITH CAD AND TOMO ULTRASOUND BILATERAL BREAST COMPARISON:  Previous exam(s). ACR Breast Density Category c: The breast tissue is heterogeneously dense, which may obscure small masses. FINDINGS: Spot compression magnification images through the inferior aspect of the right breast demonstrates a 2.0 cm group of pleomorphic calcifications. Associated with these calcifications is a spiculated distorted mass. In the superior left breast there is an obscured oval mass measuring approximately 8 Morgan. Mammographic images were processed with CAD. Ultrasound targeted to the right breast at 6 o'clock, 3 cm  from the nipple demonstrates an irregular hypoechoic shadowing mass measuring 2.3 x 1.4 x 1.7 cm. The mass abuts the underlying musculature,  and muscle involvement cannot be excluded. Ultrasound of the right axilla demonstrates multiple normal-appearing lymph nodes. Ultrasound of the left breast at 12:30, 3 cm from the nipple demonstrates an anechoic oval circumscribed mass measuring 0.9 x 0.8 x 0.9 cm. IMPRESSION: 1. There is a highly suspicious mass in the right breast at 6 o'clock associated with pleomorphic calcifications. 2.  No evidence of right axillary lymphadenopathy. 3. There is a benign-appearing cyst in the superior left breast. No evidence of left breast malignancy. RECOMMENDATION: 1. Ultrasound guided biopsy is recommended for the right breast mass, and has been scheduled for 01/31/2020 at 7:30 a.m. The specimen removed can be x-ray to ensure calcifications are seen in the biopsy sample. 2. If the pathology results are malignant, MRI should be considered to exclude muscle involvement. I have discussed the findings and recommendations with the patient. If applicable, a reminder letter will be sent to the patient regarding the next appointment. BI-RADS CATEGORY  5: Highly suggestive of malignancy. Electronically Signed   By: Ammie Ferrier M.D.   On: 01/23/2020 09:07   Morgan CLIP PLACEMENT RIGHT  Result Date: 01/31/2020 CLINICAL DATA:  Post biopsy mammogram of the right breast for clip placement, and specimen radiograph for calcifications. EXAM: DIAGNOSTIC RIGHT MAMMOGRAM POST ULTRASOUND BIOPSY COMPARISON:  Previous exam(s). FINDINGS: Mammographic images were obtained following ultrasound guided biopsy of a right breast mass at 6 o'clock. The biopsy marking clip is in expected position at the site of biopsy. No definite calcifications are seen with in the 5 core samples removed. IMPRESSION: Appropriate positioning of the ribbon shaped biopsy marking clip at the site of biopsy in the inferior right  breast. Final Assessment: Post Procedure Mammograms for Marker Placement Electronically Signed   By: Ammie Ferrier M.D.   On: 01/31/2020 08:51   [place] RT BREAST BX W LOC DEV 1ST LESION IMG BX SPEC US GUIDE  Addendum Date: 02/01/2020   ADDENDUM REPORT: 02/01/2020 11:22 ADDENDUM: Pathology revealed GRADE II INVASIVE MAMMARY CARCINOMA AND MAMMARY CARCINOMA IN SITU WITH PERINEURAL INVASION AND CALCIFICATIONS of the RIGHT breast, 6 o'clock. This was found to be concordant by Dr. Ammie Ferrier. Pathology results were discussed with the patient by telephone. The patient reported doing well after the biopsy with tenderness at the site. Post biopsy instructions and care were reviewed and questions were answered. The patient was encouraged to call [facility] for any additional concerns. The patient was [facility]. Recommend bilateral breast MRI to evaluate for muscle invasion. Pathology results reported by Stacie Acres RN on 02/01/2020. Electronically Signed   By: Ammie Ferrier M.D.   On: 02/01/2020 11:22   Result Date: 02/01/2020 CLINICAL DATA:  59 year old female presenting for ultrasound-guided biopsy of a right breast mass. EXAM: ULTRASOUND GUIDED RIGHT BREAST CORE NEEDLE BIOPSY COMPARISON:  Previous exam(s). PROCEDURE: I met with the patient and we discussed the procedure of ultrasound-guided biopsy, including benefits and alternatives. We discussed the high likelihood of a successful procedure. We discussed the risks of the procedure, including infection, bleeding, tissue injury, clip migration, and inadequate sampling. Informed written consent was given. The usual time-out protocol was performed immediately prior to the procedure. Lesion quadrant: Lower inner quadrant Using sterile technique and 1% Lidocaine as local anesthetic, under direct ultrasound visualization, a 14  gauge spring-loaded device was used to perform biopsy of a mass in the right breast at 6 o'clock using  an inferior approach. At the conclusion of the procedure ribbon shaped tissue marker clip was deployed into the biopsy cavity. Follow up 2 view mammogram was performed and dictated separately. IMPRESSION: Ultrasound guided biopsy of a mass in the right breast at 6 o'clock. No apparent complications. Electronically Signed: By: Ammie Ferrier M.D. On: 01/31/2020 08:51       IMPRESSION/PLAN: 1. Stage IB, cT2N0M0 grade 2, ER/PR positive invasive ductal carcinoma of the right breast. Dr. Lisbeth Renshaw discusses the pathology findings and reviews the nature of invasive right breast disease. The consensus from the breast conference includes an MRI for extent of disease and to order an oncotype dx score on her breast biopsy to determine a role for systemic therapy. She may be a candidate for neoadjuvant chemotherapy versus neoadjuvant antihormone therapy. The hope is to be able to downsize her disease if it was involving the pectoralis with breast conservation with lumpectomy with sentinel node biopsy. We discussed the rationale for external radiotherapy to the breast to reduce the risks of local recurrence followed by antiestrogen therapy. We discussed the risks, benefits, short, and long term effects of radiotherapy, and the patient is interested in proceeding at the appropriate time. Dr. Lisbeth Renshaw discusses the delivery and logistics of radiotherapy and anticipates a course of 4-6 1/2 weeks of radiotherapy. We will see her back about 2 weeks after surgery to discuss the simulation process and anticipate we starting radiotherapy about 4-6 weeks after surgery.    In a visit lasting 45 minutes, greater than 50% of the time was spent face to face reviewing her case, as well as in preparation of, discussing, and coordinating the patient's care.  The above documentation reflects my direct findings during this shared patient  visit. Please see the separate note by Dr. Lisbeth Renshaw on this date for the remainder of the patient's plan of care.    Carola Rhine, PAC

## 2020-02-08 NOTE — Progress Notes (Signed)
Rx printed per MD verbal orders.

## 2020-02-09 ENCOUNTER — Encounter: Payer: Self-pay | Admitting: Genetic Counselor

## 2020-02-09 DIAGNOSIS — Z808 Family history of malignant neoplasm of other organs or systems: Secondary | ICD-10-CM | POA: Insufficient documentation

## 2020-02-09 DIAGNOSIS — Z8041 Family history of malignant neoplasm of ovary: Secondary | ICD-10-CM | POA: Insufficient documentation

## 2020-02-09 DIAGNOSIS — Z806 Family history of leukemia: Secondary | ICD-10-CM | POA: Insufficient documentation

## 2020-02-09 DIAGNOSIS — Z803 Family history of malignant neoplasm of breast: Secondary | ICD-10-CM | POA: Insufficient documentation

## 2020-02-09 DIAGNOSIS — Z800 Family history of malignant neoplasm of digestive organs: Secondary | ICD-10-CM | POA: Insufficient documentation

## 2020-02-09 DIAGNOSIS — Z8042 Family history of malignant neoplasm of prostate: Secondary | ICD-10-CM | POA: Insufficient documentation

## 2020-02-09 NOTE — Progress Notes (Signed)
REFERRING PROVIDER: Truitt Merle, MD [address] [place],  [place] [address]  PRIMARY PROVIDER:  [name], [name], MD  PRIMARY REASON FOR VISIT:  1. Malignant neoplasm of lower-outer quadrant of right breast of female, estrogen receptor positive ([place])   2. Family history of breast cancer   3. Family history of ovarian cancer   4. Family history of colon cancer   5. Family history of prostate cancer   6. Family history of skin cancer   7. Family history of leukemia      I connected with Jean Morgan on 02/08/2020 at 3:15pm EDT by Webex video conference and verified that I am speaking with the correct person using two identifiers.   Patient location: [facility] clinic Provider location: [facility] office  HISTORY OF PRESENT ILLNESS:   Jean Morgan, a 59 y.o. female, was seen for a [place] cancer genetics consultation at the request of Dr. Burr Medico due to a personal and family history of of breast cancer.  Jean Morgan presents to clinic today to discuss the possibility of a hereditary predisposition to cancer, genetic testing, and to further clarify her future cancer risks, as well as potential cancer risks for family members.   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 The treatment plan includes breast MRI, Oncotype Dx, surgery, radiation therapy as appropriate, antiestrogen therapy.   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  CANCER HISTORY:  Oncology History Overview Note  Cancer Staging Malignant neoplasm of lower-outer quadrant of right breast of female, estrogen receptor positive ([place]) Staging form: Breast, AJCC 8th Edition - Clinical stage from 01/31/2020: Stage IB (cT2, cN0, cM0, G2, ER+, PR+, HER2-) - Signed by Truitt Merle, MD on 02/07/2020    Malignant neoplasm of lower-outer quadrant of right breast of female, estrogen receptor positive ([place])  01/23/2020 Imaging    DEXA 01/23/20  Osteopenia with lowest T-score -1.6 at left femur neck  Site Region Measured Date Measured Age YA BMD Significant CHANGE T-score DualFemur Neck Left  01/23/2020    60.1         -1.6    0.815 g/cm2   AP Spine  L1-L4 (L3) 01/23/2020    60.1         -1.1    1.035 g/cm2   DualFemur Total Mean 01/23/2020    60.1         -1.2    0.851 g/cm2   01/23/2020 Mammogram   Diangostic Mammogram 01/23/20  IMPRESSION: 1. There is a highly suspicious mass in the right breast at 6 o'clock 3 cm from the nipple demonstrates an irregular hypoechoic shadowing mass measuring 2.3 x 1.4 x 1.7 cm associated with pleomorphic calcifications.   2.  No evidence of right axillary lymphadenopathy.   3. There is a benign-appearing cyst in the superior left breast at 12:30, 3 cm from the nipple demonstrates an anechoic oval circumscribed mass measuring 0.9 x 0.8 x 0.9 cm. No evidence of left breast malignancy.    01/31/2020 Cancer Staging   Staging form: Breast, AJCC 8th Edition - Clinical stage from 01/31/2020: Stage IB (cT2, cN0, cM0, G2, ER+, PR+, HER2-) - Signed by Truitt Merle, MD on 02/07/2020   01/31/2020 Initial Biopsy   Diagnosis 01/31/20 Breast, right, needle core biopsy, 6 o'clock - INVASIVE MAMMARY CARCINOMA AND MAMMARY CARCINOMA IN SITU WITH PERINEURAL INVASION AND CALCIFICATIONS. Microscopic Comment The carcinoma is  nuclear grade 2. The greatest linear extent of tumor in any one core is 16 mm. Immunohistochemistry of E-cadherin will be reported separately. Ancillary studies will be reported separately.   01/31/2020 Receptors her2   PROGNOSTIC INDICATORS Results: IMMUNOHISTOCHEMICAL AND MORPHOMETRIC ANALYSIS PERFORMED MANUALLY The tumor cells are NEGATIVE for Her2 (1+). Estrogen Receptor: 100%, POSITIVE, STRONG STAINING INTENSITY Progesterone Receptor: 80%, POSITIVE, STRONG STAINING INTENSITY Proliferation Marker Ki67: 15%   02/02/2020 Initial Diagnosis   Malignant neoplasm of lower-outer  quadrant of right breast of female, estrogen receptor positive ([place])      RISK FACTORS:  Menarche was at age 4.  First live birth at age 40.  OCP use for approximately 7-8 years.  Ovaries intact: yes.  Hysterectomy: no.  Menopausal status: postmenopausal.  HRT use: 0 years. Colonoscopy: yes; about 5 years ago, normal per patient. Mammogram within the last year: yes. Any excessive radiation exposure in the past: no  Past Medical History:  Diagnosis Date  . Cancer ([place])    skin  . Family history of breast cancer   . Family history of colon cancer   . Family history of leukemia   . Family history of ovarian cancer   . Family history of prostate cancer   . Family history of skin cancer   . Hypothyroidism     Past Surgical History:  Procedure Laterality Date  . DENTAL SURGERY    . TONSILLECTOMY      Social History   Socioeconomic History  . Marital status: Married    Spouse name: Not on file  . Number of children: 1  . Years of education: Not on file  . Highest education level: Not on file  Occupational History  . Not on file  Tobacco Use  . Smoking status: Never Smoker  . Smokeless tobacco: Never Used  Substance and Sexual Activity  . Alcohol use: Yes    Alcohol/week: 7.0 standard drinks    Types: 7 Glasses of wine per week  . Drug use: No  . Sexual activity: Not on file  Other Topics Concern  . Not on file  Social History Narrative  . Not on file   Social Determinants of Health   Financial Resource Strain:   . Difficulty of Paying Living Expenses:   Food Insecurity:   . Worried About Charity fundraiser in the Last Year:   . Arboriculturist in the Last Year:   Transportation Needs:   . Film/video editor (Medical):   Marland Kitchen Lack of Transportation (Non-Medical):   Physical Activity:   . Days of Exercise per Week:   . Minutes of Exercise per Session:   Stress:   . Feeling of Stress :   Social Connections:   . Frequency of Communication with  Friends and Family:   . Frequency of Social Gatherings with Friends and Family:   . Attends Religious Services:   . Active Member of Clubs or Organizations:   . Attends Archivist Meetings:   Marland Kitchen Marital Status:      FAMILY HISTORY:  We obtained a detailed, 4-generation family history.  Significant diagnoses are listed below: Family History  Problem Relation Age of Onset  . Colon polyps Mother        more than 10  . Colitis Mother   . Arthritis Mother   . Hyperlipidemia Mother   . Diabetes Mother   . Skin cancer Mother   . Heart disease Father   . Hyperlipidemia Father   .  Hypertension Father   . Colon cancer Paternal Aunt        dx. in her 63s  . Diabetes Maternal Grandmother   . Breast cancer Maternal Grandmother        dx. >50  . Breast cancer Other 54  . Ovarian cancer Other 23  . Colon cancer Other 39  . Skin cancer Other 72  . Prostate cancer Half-Brother 65  . Leukemia Maternal Great-grandmother 73       chronic   Jean Morgan has one daughter who is 35. She has two paternal half-brothers who are 77 and 66, and she does not have much information about their health. She has one maternal half-brother who died at the age of 88 and had a reported history of prostate cancer when he was about 60 years old.   Jean Morgan mother is 13 and has a history of skin cancer and colon polyps. Her mother has had colon polyps on every colonoscopy, and Jean Morgan recalls that her mother had 12-13 polyps on one colonoscopy. Jean Morgan mother had two maternal half-sisters and one half-brother. One sister died at the age of 58 and had intellectual disability. Jean Morgan maternal grandmother had a history of breast cancer diagnosed when she was older than 70. Her grandmother's sister had multiple types of cancer - skin cancer at the age of 63, ovarian cancer at the age of 71, breast cancer at the age of 26, colon cancer at the age of 51, and breast cancer again at the age of  46/64. Her maternal great-grandmother was diagnosed with leukemia at the age of 74 and died in her 95s. Jean Morgan maternal grandfather died in his mid-29s and did not have cancer.  Jean Morgan father died at the age of 79 from a stroke. She had one paternal aunt who was diagnosed with colon cancer in her 73s. Her paternal grandmother died in her 37s or 52s, and she does not have information about her paternal grandfather. There are no other known diagnoses of cancer on the paternal side of the family.  Jean Morgan is unaware of previous family history of genetic testing for hereditary cancer risks. Patient's maternal ancestors are of English descent, and paternal ancestors are of unknown descent. There is no reported Ashkenazi Jewish ancestry. There is no known consanguinity.  GENETIC COUNSELING ASSESSMENT: Jean Morgan is a 59 y.o. female with a personal history of breast cancer and a family history of breast, colon, prostate, and ovarian cancer, which is somewhat suggestive of a hereditary cancer syndrome and predisposition to cancer. We, therefore, discussed and recommended the following at today's visit.   DISCUSSION: We discussed that approximately 5-10% of cancer is hereditary. Most cases of hereditary breast cancer are associated with the BRCA1 and BRCA2 genes, although there are other genes that can be associated with hereditary breast cancer syndromes.  We also discussed that colon cancer diagnosed at young ages and someone having many colon polyps over their lifetime may also be indicative of a hereditary cancer/polyposis syndrome.  We discussed that testing is beneficial for several reasons, including knowing about other cancer risks, identifying potential screening and risk-reduction options that may be appropriate, and to understand if other family members could be at risk for cancer and allow them to undergo genetic testing.  We reviewed the characteristics, features and inheritance  patterns of hereditary cancer syndromes. We also discussed genetic testing, including the appropriate family members to test, the process of testing, insurance coverage and turn-around-time  for results. We discussed the implications of a negative, positive and/or variant of uncertain significant result. We recommended Jean Morgan pursue genetic testing for the [employer].   The Multi-Cancer Panel offered by [facility] includes sequencing and/or deletion duplication testing of the following 85 genes: AIP, ALK, APC, ATM, AXIN2,BAP1,  BARD1, BLM, BMPR1A, BRCA1, BRCA2, BRIP1, CASR, CDC73, CDH1, CDK4, CDKN1B, CDKN1C, CDKN2A (p14ARF), CDKN2A (p16INK4a), CEBPA, CHEK2, CTNNA1, DICER1, DIS3L2, EGFR (c.2369C>T, p.Thr790Met variant only), EPCAM (Deletion/duplication testing only), FH, FLCN, GATA2, GPC3, GREM1 (Promoter region deletion/duplication testing only), HOXB13 (c.251G>A, p.Gly84Glu), HRAS, KIT, MAX, MEN1, MET, MITF (c.952G>A, p.Glu318Lys variant only), MLH1, MSH2, MSH3, MSH6, MUTYH, NBN, NF1, NF2, NTHL1, PALB2, PDGFRA, PHOX2B, PMS2, POLD1, POLE, POT1, PRKAR1A, PTCH1, PTEN, RAD50, RAD51C, RAD51D, RB1, RECQL4, RET, RNF43, RUNX1, SDHAF2, SDHA (sequence changes only), SDHB, SDHC, SDHD, SMAD4, SMARCA4, SMARCB1, SMARCE1, STK11, SUFU, TERC, TERT, TMEM127, TP53, TSC1, TSC2, VHL, WRN and WT1.    Based on Ms. Vanantwerp's personal and family history of cancer, she meets medical criteria for genetic testing. Despite that she meets criteria, she may still have an out of pocket cost. We discussed that if her out of pocket cost for testing is over $100, the laboratory will call and confirm whether she wants to proceed with testing.  If the out of pocket cost of testing is less than $100 she will be billed by the genetic testing laboratory.   PLAN: After considering the risks, benefits, and limitations, Ms. Furney provided informed consent to pursue genetic testing and the blood sample was sent to [facility] for analysis of the Multi-Cancer Panel. Results should be available within approximately two-three weeks' time, at which point they will be disclosed by telephone to Ms. Pavlicek, as will any additional recommendations warranted by these results. Ms. Gores will receive a summary of her genetic counseling visit and a copy of her results once available. This information will also be available in Epic.   Based on Ms. Bower's family history, we recommended her mother, who has had more than 10 colon polyps, have genetic counseling and testing. Ms. Dohrmann will let us know if we can be of any assistance in coordinating genetic counseling and/or testing for this family member.   Lastly, we encouraged Ms. Seeney to remain in contact with cancer genetics annually so that we can continuously update the family history and inform her of any changes in cancer genetics and testing that may be of benefit for this family.   Ms. Bracken questions were answered to her satisfaction today. Our contact information was provided should additional questions or concerns arise. Thank you for the referral and allowing [place] to share in the care of your patient.   Clint Guy, [place], [facility] Licensed, Certified Dispensing [email or web address] Phone: [phone]  The patient was seen for a total of 30 minutes in face-to-face genetic counseling.  This patient was discussed with Drs. Magrinat, Lindi Adie and/or Burr Medico who agrees with the above.    _______________________________________________________________________ For Office Staff:  Number of people involved in session: 1 Was an Intern/ student involved with case: no

## 2020-02-16 ENCOUNTER — Encounter: Payer: Self-pay | Admitting: *Deleted

## 2020-02-16 NOTE — Progress Notes (Signed)
Left message for a return phone call to follow up from [facility]. °

## 2020-02-17 ENCOUNTER — Other Ambulatory Visit: Payer: Self-pay

## 2020-02-17 ENCOUNTER — Encounter: Payer: Self-pay | Admitting: *Deleted

## 2020-02-17 ENCOUNTER — Ambulatory Visit (HOSPITAL_COMMUNITY)
Admission: RE | Admit: 2020-02-17 | Discharge: 2020-02-17 | Disposition: A | Discharge status: Home / Self Care | Payer: BC Managed Care – PPO | Source: Ambulatory Visit | Attending: General Surgery | Admitting: General Surgery

## 2020-02-17 DIAGNOSIS — C50511 Malignant neoplasm of lower-outer quadrant of right female breast: Secondary | ICD-10-CM | POA: Diagnosis not present

## 2020-02-17 DIAGNOSIS — Z170 Estrogen receptor positive status [ER+]: Secondary | ICD-10-CM | POA: Diagnosis present

## 2020-02-17 MED ORDER — GADOBUTROL 1 MMOL/ML IV SOLN
7.0000 mL | Freq: Once | INTRAVENOUS | Status: AC | PRN
  Administered 2020-02-17: 7 mL via INTRAVENOUS

## 2020-02-20 ENCOUNTER — Encounter: Payer: Self-pay | Admitting: *Deleted

## 2020-02-21 ENCOUNTER — Encounter: Payer: Self-pay | Admitting: *Deleted

## 2020-02-21 ENCOUNTER — Other Ambulatory Visit (HOSPITAL_COMMUNITY): Payer: BC Managed Care – PPO

## 2020-02-21 NOTE — Progress Notes (Signed)
[place] Work   Clinical Social Work received phone call from patient requesting information on support services at [facility].  CSW and patient reviewed the available resources and [facility] support team.  Patient also requested an Network engineer, and CSW completed referral.  Patient expressed some anxiety around waiting for her oncotype, and the unknowns of her treatment plan.  Patient had multiple questions regarding treatment and medical appointments.  CSW encouraged patient to contact breast navigator for support.  Patient expressed interest in [facility] support programs and was agreeable to being added to the [facility] support calendar mailing list.  CSW provided contact information and encouraged patient to call with questions or concerns.    Jean Morgan, MSW, LCSW, OSW-C Clinical Social Worker [facility] [phone]

## 2020-02-22 ENCOUNTER — Encounter: Payer: Self-pay | Admitting: Hematology

## 2020-02-22 ENCOUNTER — Ambulatory Visit (HOSPITAL_COMMUNITY): Payer: BC Managed Care – PPO

## 2020-02-23 ENCOUNTER — Encounter: Payer: Self-pay | Admitting: *Deleted

## 2020-02-23 NOTE — Progress Notes (Signed)
Received oncotype results of 17/5%.  Patient is aware.  Notified team

## 2020-02-24 ENCOUNTER — Encounter: Payer: Self-pay | Admitting: Hematology

## 2020-02-27 ENCOUNTER — Inpatient Hospital Stay: Payer: BC Managed Care – PPO | Attending: Hematology | Admitting: Hematology

## 2020-02-27 ENCOUNTER — Encounter: Payer: Self-pay | Admitting: Hematology

## 2020-02-27 DIAGNOSIS — C50511 Malignant neoplasm of lower-outer quadrant of right female breast: Secondary | ICD-10-CM | POA: Diagnosis not present

## 2020-02-27 DIAGNOSIS — Z7289 Other problems related to lifestyle: Secondary | ICD-10-CM | POA: Insufficient documentation

## 2020-02-27 DIAGNOSIS — Z170 Estrogen receptor positive status [ER+]: Secondary | ICD-10-CM | POA: Diagnosis not present

## 2020-02-27 DIAGNOSIS — Z800 Family history of malignant neoplasm of digestive organs: Secondary | ICD-10-CM | POA: Insufficient documentation

## 2020-02-27 DIAGNOSIS — R200 Anesthesia of skin: Secondary | ICD-10-CM | POA: Insufficient documentation

## 2020-02-27 DIAGNOSIS — Z808 Family history of malignant neoplasm of other organs or systems: Secondary | ICD-10-CM | POA: Insufficient documentation

## 2020-02-27 DIAGNOSIS — Z833 Family history of diabetes mellitus: Secondary | ICD-10-CM | POA: Insufficient documentation

## 2020-02-27 DIAGNOSIS — M85852 Other specified disorders of bone density and structure, left thigh: Secondary | ICD-10-CM | POA: Insufficient documentation

## 2020-02-27 DIAGNOSIS — Z8249 Family history of ischemic heart disease and other diseases of the circulatory system: Secondary | ICD-10-CM | POA: Insufficient documentation

## 2020-02-27 DIAGNOSIS — R2689 Other abnormalities of gait and mobility: Secondary | ICD-10-CM | POA: Insufficient documentation

## 2020-02-27 DIAGNOSIS — Z885 Allergy status to narcotic agent status: Secondary | ICD-10-CM | POA: Insufficient documentation

## 2020-02-27 DIAGNOSIS — Z8379 Family history of other diseases of the digestive system: Secondary | ICD-10-CM | POA: Insufficient documentation

## 2020-02-27 DIAGNOSIS — Z8261 Family history of arthritis: Secondary | ICD-10-CM | POA: Insufficient documentation

## 2020-02-27 DIAGNOSIS — Z8041 Family history of malignant neoplasm of ovary: Secondary | ICD-10-CM | POA: Insufficient documentation

## 2020-02-27 DIAGNOSIS — G5603 Carpal tunnel syndrome, bilateral upper limbs: Secondary | ICD-10-CM | POA: Insufficient documentation

## 2020-02-27 DIAGNOSIS — Z83438 Family history of other disorder of lipoprotein metabolism and other lipidemia: Secondary | ICD-10-CM | POA: Insufficient documentation

## 2020-02-27 DIAGNOSIS — Z79899 Other long term (current) drug therapy: Secondary | ICD-10-CM | POA: Insufficient documentation

## 2020-02-27 DIAGNOSIS — Z85820 Personal history of malignant melanoma of skin: Secondary | ICD-10-CM | POA: Insufficient documentation

## 2020-02-27 DIAGNOSIS — Z806 Family history of leukemia: Secondary | ICD-10-CM | POA: Insufficient documentation

## 2020-02-27 DIAGNOSIS — Z803 Family history of malignant neoplasm of breast: Secondary | ICD-10-CM | POA: Insufficient documentation

## 2020-02-27 DIAGNOSIS — Z8042 Family history of malignant neoplasm of prostate: Secondary | ICD-10-CM | POA: Insufficient documentation

## 2020-02-27 DIAGNOSIS — Z8371 Family history of colonic polyps: Secondary | ICD-10-CM | POA: Insufficient documentation

## 2020-02-27 MED ORDER — ANASTROZOLE 1 MG PO TABS: 1.0000 mg | ORAL_TABLET | Freq: Every day | ORAL | 3 refills | Status: DC

## 2020-02-27 NOTE — Progress Notes (Signed)
[place]   [phone] Fax:(336) [phone]   Clinic Follow up Note   Patient Care Team: [name], [name], MD as PCP - General (Family Medicine) Rolm Bookbinder, MD as Consulting Physician (Dermatology) [name], RN as Oncology Nurse Navigator [name], RN as Oncology Nurse Navigator [name], MD as Consulting Physician (General Surgery) Truitt Merle, MD as Consulting Physician (Hematology) [name], MD as Consulting Physician (Radiation Oncology)   I connected with Jean Morgan on 02/27/2020 at  4:00 PM EDT by telephone visit and verified that I am speaking with the correct person using two identifiers.  I discussed the limitations, risks, security and privacy concerns of performing an evaluation and management service by telephone and the availability of in person appointments. I also discussed with the patient that there may be a patient responsible charge related to this service. The patient expressed understanding and agreed to proceed.   Other persons participating in the visit and their role in the encounter:  None   Patient's location:  Her home Provider's location:  My Office   CHIEF COMPLAINT: F/u of right breast cancer  SUMMARY OF ONCOLOGIC HISTORY: Oncology History Overview Note  Cancer Staging Malignant neoplasm of lower-outer quadrant of right breast of female, estrogen receptor positive ([place]) Staging form: Breast, AJCC 8th Edition - Clinical stage from 01/31/2020: Stage IB (cT2, cN0, cM0, G2, ER+, PR+, HER2-) - Signed by Truitt Merle, MD on 02/07/2020    Malignant neoplasm of lower-outer quadrant of right breast of female, estrogen receptor positive ([place])  01/23/2020 Imaging   DEXA 01/23/20  Osteopenia with lowest T-score -1.6 at left femur neck  Site Region Measured Date Measured Age YA BMD Significant CHANGE T-score DualFemur Neck Left  01/23/2020    60.1         -1.6    0.815 g/cm2   AP Spine  L1-L4 (L3) 01/23/2020     60.1         -1.1    1.035 g/cm2   DualFemur Total Mean 01/23/2020    60.1         -1.2    0.851 g/cm2   01/23/2020 Mammogram   Diangostic Mammogram 01/23/20  IMPRESSION: 1. There is a highly suspicious mass in the right breast at 6 o'clock 3 cm from the nipple demonstrates an irregular hypoechoic shadowing mass measuring 2.3 x 1.4 x 1.7 cm associated with pleomorphic calcifications.   2.  No evidence of right axillary lymphadenopathy.   3. There is a benign-appearing cyst in the superior left breast at 12:30, 3 cm from the nipple demonstrates an anechoic oval circumscribed mass measuring 0.9 x 0.8 x 0.9 cm. No evidence of left breast malignancy.    01/31/2020 Cancer Staging   Staging form: Breast, AJCC 8th Edition - Clinical stage from 01/31/2020: Stage IB (cT2, cN0, cM0, G2, ER+, PR+, HER2-) - Signed by Truitt Merle, MD on 02/07/2020   01/31/2020 Initial Biopsy   Diagnosis 01/31/20 Breast, right, needle core biopsy, 6 o'clock - INVASIVE MAMMARY CARCINOMA AND MAMMARY CARCINOMA IN SITU WITH PERINEURAL INVASION AND CALCIFICATIONS. Microscopic Comment The carcinoma is nuclear grade 2. The greatest linear extent of tumor in any one core is 16 mm. Immunohistochemistry of E-cadherin will be reported separately. Ancillary studies will be reported separately.   01/31/2020 Receptors her2   PROGNOSTIC INDICATORS Results: IMMUNOHISTOCHEMICAL AND MORPHOMETRIC ANALYSIS PERFORMED MANUALLY The tumor cells are NEGATIVE for Her2 (1+). Estrogen Receptor: 100%, POSITIVE, STRONG STAINING INTENSITY Progesterone Receptor:  80%, POSITIVE, STRONG STAINING INTENSITY Proliferation Marker Ki67: 15%   01/31/2020 Oncotype testing   Low risk with recurrence score 17.  Distant risk of recurrence at 9 years 5% with AI or Tamoxifen. There is less than 1% benefit of chemotherapy.    02/02/2020 Initial Diagnosis   Malignant neoplasm of lower-outer quadrant of right breast of female, estrogen receptor positive ([place])     02/17/2020 Imaging   Breast MRI  IMPRESSION: Known malignancy in the LOWER central portion of the RIGHT breast measuring 3.1 centimeters.   No adenopathy in the RIGHT axilla.   No additional areas of concern are identified in either breast.     02/27/2020 -  Neo-Adjuvant Anti-estrogen oral therapy   Anastrozole 56m once daily starting in 02/27/20 with half tablet to see if tolerable.       CURRENT THERAPY:  Anastrozole 185monce daily starting in 02/27/20 with half tablet to see if tolerable.   INTERVAL HISTORY:  Jean Morgan here for a follow up. She notes she is doing well. She was given Celexa from PCP but has not started. She is still hesitant about antidepressants. She is willing to try Anastrozole and clinical study.   REVIEW OF SYSTEMS:   Constitutional: Denies fevers, chills or abnormal weight loss Eyes: Denies blurriness of vision Ears, nose, mouth, throat, and face: Denies mucositis or sore throat Respiratory: Denies cough, dyspnea or wheezes Cardiovascular: Denies palpitation, chest discomfort or lower extremity swelling Gastrointestinal:  Denies nausea, heartburn or change in bowel habits Skin: Denies abnormal skin rashes Lymphatics: Denies new lymphadenopathy or easy bruising Neurological:Denies numbness, tingling or new weaknesses Behavioral/Psych: Mood is stable, no new changes  All other systems were reviewed with the patient and are negative.  MEDICAL HISTORY:  Past Medical History:  Diagnosis Date  . Cancer ([place] Morgan   skin  . Family history of breast cancer   . Family history of colon cancer   . Family history of leukemia   . Family history of ovarian cancer   . Family history of prostate cancer   . Family history of skin cancer   . Hypothyroidism     SURGICAL HISTORY: Past Surgical History:  Procedure Laterality Date  . DENTAL SURGERY    . TONSILLECTOMY      I have reviewed the social history and family history with the patient and they are  unchanged from previous note.  ALLERGIES:  is allergic to latex and oxycodone.  MEDICATIONS:  Current Outpatient Medications  Medication Sig Dispense Refill  . anastrozole (ARIMIDEX) 1 MG tablet Take 1 tablet (1 mg total) by mouth daily. 30 tablet 3  . Ascorbic Acid (VITAMIN C ER PO) Take by mouth.    . cetirizine (ZYRTEC) 10 MG tablet Take 1 tablet (10 mg total) by mouth daily. 30 tablet 11  . Cholecalciferol (VITAMIN D PO) Take by mouth as needed.     . diazepam (VALIUM) 5 MG tablet Take 0.5-1 tablets (2.5-5 mg total) by mouth every 12 (twelve) hours as needed for anxiety. Take 1-2 tabs 15 min before procedures. 40 tablet 1  . GARLIC PO Take by mouth.    . valACYclovir (VALTREX) 1000 MG tablet Start ASAP after symptoms onset and within 48 hours:1 tab daily for 5 days prn. 60 tablet 0   No current facility-administered medications for this visit.    PHYSICAL EXAMINATION: ECOG PERFORMANCE STATUS: 0 - Asymptomatic  No vitals taken today, Exam not performed today   LABORATORY DATA:  I  have reviewed the data as listed CBC Latest Ref Rng & Units 02/08/2020 12/29/2019 08/18/2018  WBC 4.0 - 10.5 K/uL 9.2 7.3 8.4  Hemoglobin 12.0 - 15.0 g/dL 12.8 12.8 13.5  Hematocrit 36.0 - 46.0 % 39.5 37.9 39.8  Platelets 150 - 400 K/uL 286 279.0 272.0     CMP Latest Ref Rng & Units 02/08/2020 12/29/2019 08/18/2018  Glucose 70 - 99 mg/dL 113(H) 92 98  BUN 6 - 20 mg/dL '11 11 10  ' Creatinine 0.44 - 1.00 mg/dL 0.82 0.77 0.75  Sodium 135 - 145 mmol/L 142 141 140  Potassium 3.5 - 5.1 mmol/L 4.1 3.8 3.6  Chloride 98 - 111 mmol/L 108 104 102  CO2 22 - 32 mmol/L '25 28 27  ' Calcium 8.9 - 10.3 mg/dL 9.4 9.6 9.7  Total Protein 6.5 - 8.1 g/dL 7.5 7.0 7.7  Total Bilirubin 0.3 - 1.2 mg/dL 0.4 0.5 0.7  Alkaline Phos 38 - 126 U/L 85 79 79  AST 15 - 41 U/L '20 17 17  ' ALT 0 - 44 U/L '19 16 14      ' RADIOGRAPHIC STUDIES: I have personally reviewed the radiological images as listed and agreed with the findings in  the report. No results found.   ASSESSMENT & PLAN:  Jean Morgan is a 59 y.o. female with    1. 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 This mass is deep in breast with possible invasion to chest wall muscle.  -Given the early stage disease, she likely need a lumpectomy. She is agreeable with that. She was seen by [name] and likely will proceed with surgery soon.  -I have personally reviewed her 02/17/20 Breast MRI which did not indicate the muscular invasion from her breast cancer. -I sent her biopsy sample for Oncotype Dx testing. Today I discussed results with her which show low risk with RC 17. She has 5% risk of future breast cancer with AI or Tamoxifen. There is no significant benefit of chemotherapy and I will not recommend. I discussed with pt -I think she is a candidate for upfront surgery but neoadjuvant endocrine treatment is still an option which she is waiting for surgery. I have sent a message to his surgeon [name] to see what he recommends -I discussed starting with neoadjuvant antiestrogen therapy with Anastrozole. Will continue through surgery and Radiation for total 5-7 years. I reviewed possible side effects with her such as hot flashes, joint pain, decreased bone density, mood swings, etc. She does have mild osteopenia. Will monitor on treatment. She can start at half tablet and if tolerable she can increase to whole tablet. She is agreeable to try.  -Will monitor her response with repeat breast US or MRI if she will be on it for 3 months or longer before surgery.  -I recommended the observation clinical study on antiestrogen therapy with NP Ria Comment. She is interested in participating.  -F/u in 3 months or after surgery     2. 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 S/p removal.  -She has 1.4cm right superior TR 2 nodule seen on [place] in 01/07/2020, likely benign.  -She has been on Lexapro for depression before but did not tolerate.  She was given Celexa 70m but not started yet, she is fine to start now. Will watch mood on AI. I discussed Celexa can help her AI side effects as well. She will think about starting Celexa.  -She notes she has been anxious about diagnosis, she has not told her daughter yet. I discussed she can talk to with our SW about this. She would like to talk to SW today.    3. Genetic Testing  -Her great aunt and her grandmother had breast cancer.  She is eligible for genetic testing. She is agreeable, will proceed with today.    4. Osteopenia -She was recently diagnosed by 12/2019 DEXA.  -Will monitor on AI which can weaken her bone.  -She plans to start Calcium and Vit D soon.    PLAN:  -I called in anastrozole today to start   -I will discuss with Dr. TMarlou Starksabout her surgery  -Lab and f/u in 3 months or after she completes adjuvant radiation    No problem-specific Assessment & Plan notes found for this encounter.   No orders of the defined types were placed in this encounter.  I discussed the assessment and treatment plan with the patient. The patient was provided an opportunity to ask questions and all were answered. The patient agreed with the plan and demonstrated an understanding of the instructions.  The patient was advised to call back or seek an in-person evaluation if the symptoms worsen or if the condition fails to improve as anticipated.  The total time spent in the appointment was 30 minutes.    YTruitt Merle MD 02/27/2020   I, AJoslyn Devon am acting as scribe for YTruitt Merle MD.   I have reviewed the above documentation for accuracy and completeness,  and I agree with the above.

## 2020-02-28 ENCOUNTER — Encounter: Payer: Self-pay | Admitting: *Deleted

## 2020-02-28 ENCOUNTER — Telehealth: Payer: Self-pay

## 2020-02-28 NOTE — Telephone Encounter (Signed)
Nutrition Assessment  Reason for Assessment:  Pt attended Breast Clinic on 02/08/20 and was given nutrition packet by nurse navigator  ASSESSMENT:  59 year old female with new diagnosis of breast cancer.  Planning MRI possible neoadjuvant treatment, surgery, oncotype, radiation, antiestrogens.  Past medical history reviewed.  Spoke with patient and introduced self and service at [facility]  Patient with questions regarding nutrition and cancer   Medications:  reviewed  Labs: reviewed  Anthropometrics:   Height: 66 inches Weight: 149 lb 3.2 BMI: 24   NUTRITION DIAGNOSIS: Food and nutrition related knowledge deficit related to new diagnosis of breast cancer as evidenced by no prior need for nutrition related information.  INTERVENTION:   Discussed briefly packet of information regarding nutritional tips for breast cancer patients.  Questions answered.  Teachback method used.  Contact information provided and patient knows to contact me with questions/concerns.    MONITORING, EVALUATION, and GOAL: Pt will consume a healthy plant based diet to maintain lean body mass throughout treatment.   Vonnie Spagnolo B. Zenia Resides, [place], [place] Registered Dietitian [phone] (pager)

## 2020-02-29 ENCOUNTER — Encounter: Payer: Self-pay | Admitting: Hematology

## 2020-02-29 ENCOUNTER — Telehealth: Payer: Self-pay | Admitting: Genetic Counselor

## 2020-02-29 ENCOUNTER — Other Ambulatory Visit: Payer: Self-pay | Admitting: Adult Health

## 2020-02-29 ENCOUNTER — Telehealth: Payer: Self-pay | Admitting: Hematology

## 2020-02-29 ENCOUNTER — Ambulatory Visit: Payer: Self-pay | Admitting: Genetic Counselor

## 2020-02-29 ENCOUNTER — Encounter: Payer: Self-pay | Admitting: Genetic Counselor

## 2020-02-29 DIAGNOSIS — Z1379 Encounter for other screening for genetic and chromosomal anomalies: Secondary | ICD-10-CM

## 2020-02-29 NOTE — Telephone Encounter (Signed)
[name] spoke with patient today, discussed the option of surgery in the next 3 to 4 weeks, versus neoadjuvant endocrine therapy, he was little concerned about the size of her tumor on NMRI, although lumpectomy is still feasible.  Patient is quite overwhelmed, and wanted to talk to me again.  I called her, encouraged her to consider surgery in the near future.  Discussed neoadjuvant and adjuvant therapy would not shrink her tumor dramatically, and quickly, it may take 3 to 6 months to see some changes.  Patient will think about it, and make a decision on timing of her breast surgery in the near future.  While we are talking, she also mentioned hand weakness for the past 6 to 8 weeks, she is not sure if it is on one side or both sides.  She denies any back pain, paresthesia or other new symptoms.  I will refer her to see neurologist Dr. Marye Round in our cancer center, she is agreeable.  She plan to start anastrozole at half tablet daily for the first week, then increase to full dose after.  She will call us when she has decided about her surgery.  Truitt Merle  02/29/2020

## 2020-02-29 NOTE — Telephone Encounter (Signed)
Revealed negative genetic testing. Discussed that we do not know why she has breast cancer or why there is cancer in the family. There could be a genetic mutation in the family that Jean Morgan did not inherit. There could also be a mutation in a different gene that we are not testing, or our current technology may not be able detect certain mutations. It will therefore be important for her to keep in contact with genetics to keep up with whether additional testing may be appropriate in the future.  Two variants of uncertain significance were detected - one in the HOXB13 gene called c.215G>T and one in the RNF43 gene called c.2139A>T. Her result is still considered normal at this time and should not impact her medical management.

## 2020-02-29 NOTE — Progress Notes (Signed)
HPI:  Jean Morgan was previously seen in the [facility] clinic due to a personal and family history of cancer and concerns regarding a hereditary predisposition to cancer. Please refer to our prior cancer genetics clinic note for more information regarding our discussion, assessment and recommendations, at the time. Jean Morgan recent genetic test results were disclosed to her, as were recommendations warranted by these results. These results and recommendations are discussed in more detail below.  CANCER HISTORY:  Oncology History Overview Note  Cancer Staging Malignant neoplasm of lower-outer quadrant of right breast of female, estrogen receptor positive ([place]) Staging form: Breast, AJCC 8th Edition - Clinical stage from 01/31/2020: Stage IB (cT2, cN0, cM0, G2, ER+, PR+, HER2-) - Signed by Jean Merle, MD on 02/07/2020    Malignant neoplasm of lower-outer quadrant of right breast of female, estrogen receptor positive ([place])  01/23/2020 Imaging   DEXA 01/23/20  Osteopenia with lowest T-score -1.6 at left femur neck  Site Region Measured Date Measured Age YA BMD Significant CHANGE T-score DualFemur Neck Left  01/23/2020    60.1         -1.6    0.815 g/cm2   AP Spine  L1-L4 (L3) 01/23/2020    60.1         -1.1    1.035 g/cm2   DualFemur Total Mean 01/23/2020    60.1         -1.2    0.851 g/cm2   01/23/2020 Mammogram   Diangostic Mammogram 01/23/20  IMPRESSION: 1. There is a highly suspicious mass in the right breast at 6 o'clock 3 cm from the nipple demonstrates an irregular hypoechoic shadowing mass measuring 2.3 x 1.4 x 1.7 cm associated with pleomorphic calcifications.   2.  No evidence of right axillary lymphadenopathy.   3. There is a benign-appearing cyst in the superior left breast at 12:30, 3 cm from the nipple demonstrates an anechoic oval circumscribed mass measuring 0.9 x 0.8 x 0.9 cm. No evidence of left breast malignancy.    01/31/2020 Cancer Staging   Staging  form: Breast, AJCC 8th Edition - Clinical stage from 01/31/2020: Stage IB (cT2, cN0, cM0, G2, ER+, PR+, HER2-) - Signed by Jean Merle, MD on 02/07/2020   01/31/2020 Initial Biopsy   Diagnosis 01/31/20 Breast, right, needle core biopsy, 6 o'clock - INVASIVE MAMMARY CARCINOMA AND MAMMARY CARCINOMA IN SITU WITH PERINEURAL INVASION AND CALCIFICATIONS. Microscopic Comment The carcinoma is nuclear grade 2. The greatest linear extent of tumor in any one core is 16 mm. Immunohistochemistry of E-cadherin will be reported separately. Ancillary studies will be reported separately.   01/31/2020 Receptors her2   PROGNOSTIC INDICATORS Results: IMMUNOHISTOCHEMICAL AND MORPHOMETRIC ANALYSIS PERFORMED MANUALLY The tumor cells are NEGATIVE for Her2 (1+). Estrogen Receptor: 100%, POSITIVE, STRONG STAINING INTENSITY Progesterone Receptor: 80%, POSITIVE, STRONG STAINING INTENSITY Proliferation Marker Ki67: 15%   01/31/2020 Oncotype testing   Low risk with recurrence score 17.  Distant risk of recurrence at 9 years 5% with AI or Tamoxifen. There is less than 1% benefit of chemotherapy.    02/02/2020 Initial Diagnosis   Malignant neoplasm of lower-outer quadrant of right breast of female, estrogen receptor positive ([place])   02/17/2020 Imaging   Breast MRI  IMPRESSION: Known malignancy in the LOWER central portion of the RIGHT breast measuring 3.1 centimeters.   No adenopathy in the RIGHT axilla.   No additional areas of concern are identified in either breast.     02/27/2020 -  Neo-Adjuvant Anti-estrogen oral  therapy   Anastrozole 44m once daily starting in 02/27/20 with half tablet to see if tolerable.    02/28/2020 Genetic Testing   Negative genetic testing:  No pathogenic variants detected on the [facility] Morgan Multi-Cancer Panel. Two variants of uncertain significance were detected - one in the HOXB13 gene called c.215G>T and one in the RNF43 gene called c.2139A>T. The report date is 02/28/2020.  The Multi-Cancer Panel  offered by Jean Morgan includes sequencing and/or deletion duplication testing of the following 85 genes: AIP, ALK, APC, ATM, AXIN2,BAP1,  BARD1, BLM, BMPR1A, BRCA1, BRCA2, BRIP1, CASR, CDC73, CDH1, CDK4, CDKN1B, CDKN1C, CDKN2A (p14ARF), CDKN2A (p16INK4a), CEBPA, CHEK2, CTNNA1, DICER1, DIS3L2, EGFR (c.2369C>T, p.Thr790Met variant only), EPCAM (Deletion/duplication testing only), FH, FLCN, GATA2, GPC3, GREM1 (Promoter region deletion/duplication testing only), HOXB13 (c.251G>A, p.Gly84Glu), HRAS, KIT, MAX, MEN1, MET, MITF (c.952G>A, p.Glu318Lys variant only), MLH1, MSH2, MSH3, MSH6, MUTYH, NBN, NF1, NF2, NTHL1, PALB2, PDGFRA, PHOX2B, PMS2, POLD1, POLE, POT1, PRKAR1A, PTCH1, PTEN, RAD50, RAD51C, RAD51D, RB1, RECQL4, RET, RNF43, RUNX1, SDHAF2, SDHA (sequence changes only), SDHB, SDHC, SDHD, SMAD4, SMARCA4, SMARCB1, SMARCE1, STK11, SUFU, TERC, TERT, TMEM127, TP53, TSC1, TSC2, VHL, WRN and WT1.      FAMILY HISTORY:  We obtained a detailed, 4-generation family history.  Significant diagnoses are listed below: Family History  Problem Relation Age of Onset  . Colon polyps Mother        more than 10  . Colitis Mother   . Arthritis Mother   . Hyperlipidemia Mother   . Diabetes Mother   . Skin cancer Mother   . Heart disease Father   . Hyperlipidemia Father   . Hypertension Father   . Colon cancer Paternal Aunt        dx. in her 465s . Diabetes Jean Morgan   . Breast cancer Jean Morgan        dx. >50  . Breast cancer Other 526      Jean great-aunt  . Ovarian cancer Other 547 . Colon cancer Other 653 . Skin cancer Other 527 . Prostate cancer Jean Morgan 434 . Leukemia Jean Morgan 89      chronic    Jean Morgan one daughter who is 278 She has two paternal half-brothers who are 579and 441 and she does not have much information about their health. She has one Jean Jean Morgan who died at the age of 590and had a reported history of prostate cancer when he  was about 60years old.   Jean Morgan is 89and has a history of skin cancer and colon polyps. Her mother has had colon polyps on every colonoscopy, and Jean Morgan that her mother had 12-13 polyps on one colonoscopy. Jean Morgan had two Jean half-sisters and one Jean Morgan. One sister died at the age of 623and had intellectual disability. Jean Morgan had a history of breast cancer diagnosed when she was older than 89 Her Morgan's sister had multiple types of cancer - skin cancer at the age of 552 ovarian cancer at the age of 577 breast cancer at the age of 524 colon cancer at the age of 89 and breast cancer again at the age of 656/64 Her Jean Morgan was diagnosed with leukemia at the age of 89and died in her 951s Ms. VRowlesmaternal grandfather died in his mmid-28sand did not have cancer.  Ms. VMcdarisfather died at the age of 518from a stroke. She had one paternal  aunt who was diagnosed with colon cancer in her 39s. Her paternal Morgan died in her 21s or 5s, and she does not have information about her paternal grandfather. There are no other known diagnoses of cancer on the paternal side of the family.  Jean Morgan is unaware of previous family history of genetic testing for hereditary cancer risks. Patient's Jean ancestors are of English descent, and paternal ancestors are of unknown descent. There is no reported Ashkenazi Jewish ancestry. There is no known consanguinity.  GENETIC TEST RESULTS: Genetic testing reported out on 02/28/2020 through the [facility] Multi-Cancer panel. No pathogenic variants were detected.   The Multi-Cancer Panel offered by Jean Morgan includes sequencing and/or deletion duplication testing of the following 85 genes: AIP, ALK, APC, ATM, AXIN2,BAP1,  BARD1, BLM, BMPR1A, BRCA1, BRCA2, BRIP1, CASR, CDC73, CDH1, CDK4, CDKN1B, CDKN1C, CDKN2A (p14ARF), CDKN2A (p16INK4a), CEBPA, CHEK2, CTNNA1,  DICER1, DIS3L2, EGFR (c.2369C>T, p.Thr790Met variant only), EPCAM (Deletion/duplication testing only), FH, FLCN, GATA2, GPC3, GREM1 (Promoter region deletion/duplication testing only), HOXB13 (c.251G>A, p.Gly84Glu), HRAS, KIT, MAX, MEN1, MET, MITF (c.952G>A, p.Glu318Lys variant only), MLH1, MSH2, MSH3, MSH6, MUTYH, NBN, NF1, NF2, NTHL1, PALB2, PDGFRA, PHOX2B, PMS2, POLD1, POLE, POT1, PRKAR1A, PTCH1, PTEN, RAD50, RAD51C, RAD51D, RB1, RECQL4, RET, RNF43, RUNX1, SDHAF2, SDHA (sequence changes only), SDHB, SDHC, SDHD, SMAD4, SMARCA4, SMARCB1, SMARCE1, STK11, SUFU, TERC, TERT, TMEM127, TP53, TSC1, TSC2, VHL, WRN and WT1. The test report will be scanned into EPIC and located under the Molecular Pathology section of the Results Review tab.  A portion of the Morgan report is included below for reference.     We discussed with Jean Morgan that because current genetic testing is not perfect, it is possible there may be a gene mutation in one of these genes that current testing cannot detect, but that chance is small.  We also discussed, that there could be another gene that has not yet been discovered, or that we have not yet tested, that is responsible for the cancer diagnoses in the family. It is also possible there is a hereditary cause for the cancer in the family that Jean Morgan did not inherit and therefore was not identified in her testing.  Therefore, it is important to remain in touch with cancer genetics in the future so that we can continue to offer Jean Morgan the most up to date genetic testing.   Genetic testing did identify two variants of uncertain significance (VUS) - one in the HOXB13 gene called c.215G>T and a second in the RNF43 gene called c.2139A>T.  At this time, it is unknown if these variants are associated with increased cancer risk or if they are normal findings, but most variants such as these get reclassified to being inconsequential. They should not be used to make medical management  decisions. With time, we suspect the lab will determine the significance of these variants, if any. If we do learn more about them, we will try to contact Jean Morgan to discuss it further. However, it is important to stay in touch with [place] periodically and keep the address and phone number up to date.  CANCER SCREENING RECOMMENDATIONS: Jean Morgan is considered negative (normal).  This means that we have not identified a hereditary cause for her personal and family history of cancer at this time. While reassuring, this does not definitively rule out a hereditary predisposition to cancer. It is still possible that there could be genetic mutations that are undetectable by current technology. There could be genetic mutations in genes that  have not been tested or identified to increase cancer risk.  Therefore, it is recommended she continue to follow the cancer management and screening guidelines provided by her oncology and primary healthcare providers.   An individual's cancer risk and medical management are not determined by genetic test results alone. Overall cancer risk assessment incorporates additional factors, including personal medical history, family history, and any available genetic information that may Morgan in a personalized plan for cancer prevention and surveillance.  RECOMMENDATIONS FOR FAMILY MEMBERS:  Individuals in this family might be at some increased risk of developing cancer, over the general population risk, simply due to the family history of cancer.  We recommended women in this family have a yearly mammogram beginning at age 93, or 33 years younger than the earliest onset of cancer, an annual clinical breast exam, and perform monthly breast self-exams. Women in this family should also have a gynecological exam as recommended by their primary provider. All family members should have a colonoscopy by age 7.  It is also possible there is a hereditary cause for the cancer  in Ms. Pressey's family that she did not inherit and therefore was not identified in her.  Based on Ms. Antrim's family history, we recommended her mother, who has had more than 12-13 colon polyps, have genetic counseling and testing. Ms. Barman will let us know if we can be of any assistance in coordinating genetic counseling and/or testing for this family member.   FOLLOW-UP: Lastly, we discussed with Ms. Delpino that cancer genetics is a rapidly advancing field and it is possible that new genetic tests will be appropriate for her and/or her family members in the future. We encouraged her to remain in contact with cancer genetics on an annual basis so we can update her personal and family histories and let her know of advances in cancer genetics that may benefit this family.   Our contact number was provided. Ms. Caisse questions were answered to her satisfaction, and she knows she is welcome to call us at anytime with additional questions or concerns.   Clint Guy, MS, [facility] Genetic Counselor [email or web address] Phone: [phone]

## 2020-03-05 ENCOUNTER — Telehealth: Payer: Self-pay | Admitting: Internal Medicine

## 2020-03-05 NOTE — Telephone Encounter (Signed)
I received a scheduling msg for Jean Morgan to see Dr. Mickeal Skinner for hand weakness. Pt has been called and scheduled to see Dr. Mickeal Skinner on 5/14 at 9am. Pt aware to arrive 15 minutes early.

## 2020-03-09 ENCOUNTER — Inpatient Hospital Stay (HOSPITAL_BASED_OUTPATIENT_CLINIC_OR_DEPARTMENT_OTHER): Payer: BC Managed Care – PPO | Admitting: Internal Medicine

## 2020-03-09 ENCOUNTER — Other Ambulatory Visit: Payer: Self-pay

## 2020-03-09 DIAGNOSIS — Z885 Allergy status to narcotic agent status: Secondary | ICD-10-CM | POA: Diagnosis not present

## 2020-03-09 DIAGNOSIS — Z170 Estrogen receptor positive status [ER+]: Secondary | ICD-10-CM | POA: Diagnosis not present

## 2020-03-09 DIAGNOSIS — R200 Anesthesia of skin: Secondary | ICD-10-CM | POA: Diagnosis not present

## 2020-03-09 DIAGNOSIS — G5603 Carpal tunnel syndrome, bilateral upper limbs: Secondary | ICD-10-CM | POA: Diagnosis not present

## 2020-03-09 DIAGNOSIS — Z8379 Family history of other diseases of the digestive system: Secondary | ICD-10-CM | POA: Diagnosis not present

## 2020-03-09 DIAGNOSIS — C50511 Malignant neoplasm of lower-outer quadrant of right female breast: Secondary | ICD-10-CM | POA: Diagnosis not present

## 2020-03-09 DIAGNOSIS — Z85820 Personal history of malignant melanoma of skin: Secondary | ICD-10-CM | POA: Diagnosis not present

## 2020-03-09 DIAGNOSIS — Z8261 Family history of arthritis: Secondary | ICD-10-CM | POA: Diagnosis not present

## 2020-03-09 DIAGNOSIS — G5600 Carpal tunnel syndrome, unspecified upper limb: Secondary | ICD-10-CM | POA: Insufficient documentation

## 2020-03-09 DIAGNOSIS — Z803 Family history of malignant neoplasm of breast: Secondary | ICD-10-CM | POA: Diagnosis not present

## 2020-03-09 DIAGNOSIS — R2689 Other abnormalities of gait and mobility: Secondary | ICD-10-CM | POA: Diagnosis not present

## 2020-03-09 DIAGNOSIS — Z806 Family history of leukemia: Secondary | ICD-10-CM | POA: Diagnosis not present

## 2020-03-09 DIAGNOSIS — Z800 Family history of malignant neoplasm of digestive organs: Secondary | ICD-10-CM | POA: Diagnosis not present

## 2020-03-09 DIAGNOSIS — Z79899 Other long term (current) drug therapy: Secondary | ICD-10-CM | POA: Diagnosis not present

## 2020-03-09 DIAGNOSIS — Z808 Family history of malignant neoplasm of other organs or systems: Secondary | ICD-10-CM | POA: Diagnosis not present

## 2020-03-09 DIAGNOSIS — Z83438 Family history of other disorder of lipoprotein metabolism and other lipidemia: Secondary | ICD-10-CM | POA: Diagnosis not present

## 2020-03-09 DIAGNOSIS — M85852 Other specified disorders of bone density and structure, left thigh: Secondary | ICD-10-CM | POA: Diagnosis not present

## 2020-03-09 DIAGNOSIS — Z8371 Family history of colonic polyps: Secondary | ICD-10-CM | POA: Diagnosis not present

## 2020-03-09 DIAGNOSIS — Z8041 Family history of malignant neoplasm of ovary: Secondary | ICD-10-CM | POA: Diagnosis not present

## 2020-03-09 DIAGNOSIS — Z8249 Family history of ischemic heart disease and other diseases of the circulatory system: Secondary | ICD-10-CM | POA: Diagnosis not present

## 2020-03-09 DIAGNOSIS — Z833 Family history of diabetes mellitus: Secondary | ICD-10-CM | POA: Diagnosis not present

## 2020-03-09 DIAGNOSIS — Z7289 Other problems related to lifestyle: Secondary | ICD-10-CM | POA: Diagnosis not present

## 2020-03-09 DIAGNOSIS — Z8042 Family history of malignant neoplasm of prostate: Secondary | ICD-10-CM | POA: Diagnosis not present
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
## 2020-03-13 ENCOUNTER — Telehealth: Payer: Self-pay | Admitting: Nurse Practitioner

## 2020-03-13 ENCOUNTER — Telehealth: Payer: Self-pay

## 2020-03-13 NOTE — Telephone Encounter (Signed)
Jean Morgan called stating she started anastrozole 10 days ago and is now experiencing tearfulness all day that is interfering with her daily living. What can she do?

## 2020-03-13 NOTE — Telephone Encounter (Signed)
I received a message from my nurse that she started anastrozole and now has tearfulness all day. I called her to speak further. She started with 1/2 tab for a week because she is sensitive to medicine. She tolerated 1/2 tab well and went up to full dose 5 days ago. Since then she has frequent tearfulness without much provocation and is interfering with quality of life. She has been depressed in the past and this doesn't feel like "normal depression." She has some anxiety about the surgery and has questions about the procedure and expectations. She also notes being angry about her diagnosis and situation but is dealing. She has talked with SW and has support at home.   I recommend to stop anastrozole. She is worried about not taking anything and prefers to go back to 1/2 tab which is fine. She is nervous about surgery and has not called Dr. Ethlyn Gallery office to finalize the plan, but has "pretty much decided to go ahead." If for some reason surgery is delayed, I would recommend for her to try exemestane while she is waiting. If she proceeds with surgery soon, I suggest to hold anti-estrogen therapy from now and will restart with an alternative agent after she completes radiation. I let her know I would ask Dr. Ethlyn Gallery office to call her. She agrees with the above and appreciates the call.   Cira Rue, NP

## 2020-03-15 ENCOUNTER — Telehealth: Payer: Self-pay | Admitting: *Deleted

## 2020-03-15 NOTE — Telephone Encounter (Signed)
Pt called asking if she would still be able to do the study since she is now on 1/2 of anastrazole. Per Annabelle Harman, pt is still eligible for study. Pt also had concerns about Dr.Toth's office contacting her for an appt. Dr.Toth's office was called. Pt was scheduled for 04/04/2020 at 1:40pm. Called pt to notify of appt. Pt was appreciative and verbalized understanding

## 2020-03-16 ENCOUNTER — Telehealth: Payer: Self-pay

## 2020-03-16 ENCOUNTER — Telehealth: Payer: Self-pay | Admitting: *Deleted

## 2020-03-16 NOTE — Telephone Encounter (Signed)
Ms Kendra Opitz left vm with [employer] 03/15/2020 at 1705.  I returned her call  She states she has an appt with [name] but doesn't understand why.  She just wants to schedule her surgery.  I told her she would need to reach out to Dr. Ethlyn Gallery office for an explanation of their process.  She states she has left a message with his office. She also voiced confusion regarding the next steps.

## 2020-03-16 NOTE — Telephone Encounter (Signed)
Received message from patient regarding questions. Called patient back and unable to reach her.  Left message for a return phone cal.

## 2020-03-20 ENCOUNTER — Ambulatory Visit: Payer: Self-pay | Admitting: General Surgery

## 2020-03-20 DIAGNOSIS — C50511 Malignant neoplasm of lower-outer quadrant of right female breast: Secondary | ICD-10-CM

## 2020-03-22 ENCOUNTER — Telehealth: Payer: Self-pay | Admitting: Nurse Practitioner

## 2020-03-22 NOTE — Telephone Encounter (Signed)
I called Ms. Poplin. She is back on 1/2 tablet anastrozole and doing OK but still gets sad, stating "just the process." She finally told her daughter who is abroad in [place] about her diagnosis which was hard. Her surgery date is 4 weeks away on 04/19/20, given the timeline I recommend for her to start celexa then after a week increase anastrozole to full dose. She is interested in researching other alternative/natural remedies to improve her mood. I encouraged her to do so, but to inform me before she starts anything to verify no DDI with AI first, then we could consider it as an additional therapy to celexa and anastrozole. I plan to call her back in 3 weeks to see how she is doing. She agrees with the plan and appreciates the call.   Cira Rue, NP

## 2020-03-23 ENCOUNTER — Other Ambulatory Visit (HOSPITAL_COMMUNITY): Payer: Self-pay | Admitting: General Surgery

## 2020-03-23 ENCOUNTER — Telehealth: Payer: Self-pay

## 2020-03-23 ENCOUNTER — Other Ambulatory Visit: Payer: Self-pay | Admitting: General Surgery

## 2020-03-23 DIAGNOSIS — C50511 Malignant neoplasm of lower-outer quadrant of right female breast: Secondary | ICD-10-CM

## 2020-03-23 NOTE — Telephone Encounter (Signed)
Jean Morgan left vm stating that she does have daily headaches.  Last night the pain felt like a band going across the back of her head. She asked what she can take. She states she will go on antidepressant.  She wanted [place] to know that she is also taking a calcium + Vit D + Vit K supplement.  I returned her call and told her she can take what she usually does for headaches.  I told her I would let Laci know she is willing to start the antidepressant.  She stated she is going to try acupuncture next week.

## 2020-03-28 ENCOUNTER — Encounter: Payer: Self-pay | Admitting: Rehabilitation

## 2020-03-28 ENCOUNTER — Ambulatory Visit: Payer: BC Managed Care – PPO | Attending: General Surgery | Admitting: Rehabilitation

## 2020-03-28 ENCOUNTER — Other Ambulatory Visit: Payer: Self-pay | Admitting: General Surgery

## 2020-03-28 ENCOUNTER — Other Ambulatory Visit: Payer: Self-pay

## 2020-03-28 DIAGNOSIS — C50511 Malignant neoplasm of lower-outer quadrant of right female breast: Secondary | ICD-10-CM

## 2020-03-28 DIAGNOSIS — M25511 Pain in right shoulder: Secondary | ICD-10-CM | POA: Diagnosis present

## 2020-03-28 DIAGNOSIS — R293 Abnormal posture: Secondary | ICD-10-CM | POA: Insufficient documentation

## 2020-03-28 DIAGNOSIS — G8929 Other chronic pain: Secondary | ICD-10-CM | POA: Diagnosis present

## 2020-03-28 DIAGNOSIS — Z170 Estrogen receptor positive status [ER+]: Secondary | ICD-10-CM

## 2020-03-28 NOTE — Patient Instructions (Signed)

## 2020-03-28 NOTE — Therapy (Signed)
[place], [place], [address] Phone: [phone]   Fax:  [phone]  Physical Therapy Evaluation  Patient Details  Name: Jean Morgan MRN: [id number] Date of Birth: 04-07-1961 Referring Provider (PT): [name]   Encounter Date: 03/28/2020  PT End of Session - 03/28/20 1211    Visit Number  1    Number of Visits  2    Date for PT Re-Evaluation  05/10/20    PT Start Time  1107    PT Stop Time  1155    PT Time Calculation (min)  48 min    Activity Tolerance  Patient tolerated treatment well    Behavior During Therapy  [facility] for tasks assessed/performed       Past Medical History:  Diagnosis Date  . Cancer ([place])    skin  . Family history of breast cancer   . Family history of colon cancer   . Family history of leukemia   . Family history of ovarian cancer   . Family history of prostate cancer   . Family history of skin cancer   . Hypothyroidism     Past Surgical History:  Procedure Laterality Date  . DENTAL SURGERY    . TONSILLECTOMY      There were no vitals filed for this visit.   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 with radiation to follow.  has started anastrozole    Patient Stated Goals  get information before surgery    Currently in Pain?  Yes    Pain Score  2     Pain Location  Shoulder    Pain Orientation  Right    Pain Descriptors / Indicators  Tightness    Pain Type  Chronic pain    Pain Onset  More than a month ago    Pain Frequency  Intermittent    Aggravating Factors   reaching overehead         [facility] PT Assessment - 03/28/20 0001      Assessment   Medical Diagnosis  Rt  breast cancer    Referring Provider (PT)  [name]    Onset Date/Surgical Date  01/31/20    Hand Dominance  Right    Prior Therapy  no      Precautions   Precaution Comments  none currently      Balance Screen   Has the patient fallen in the past 6 months  No    Has the patient had a decrease in activity level because of a fear of falling?   No    Is the patient reluctant to leave their home because of a fear of falling?   No      Home Film/video editor residence    Living Arrangements  Spouse/significant other    Available Help at Discharge  Family      Prior Function   Level of Independence  Independent    Vocation  Part time employment    Teacher, adult education     Leisure  walking and gardening       Cognition   Overall Cognitive  Status  Within Functional Limits for tasks assessed      Observation/Other Assessments   Observations  melanoma scar on Rt forearm      Sensation   Additional Comments  some numbness in the axilla from the SLNB      Coordination   Gross Motor Movements are Fluid and Coordinated  No      Posture/Postural Control   Posture/Postural Control  Postural limitations    Postural Limitations  Rounded Shoulders;Forward head      ROM / Strength   AROM / PROM / Strength  AROM;Strength      AROM   AROM Assessment Site  Shoulder    Right/Left Shoulder  Right;Left    Right Shoulder Extension  67 Degrees    Right Shoulder Flexion  168 Degrees    Right Shoulder ABduction  176 Degrees    Right Shoulder Internal Rotation  45 Degrees   pop   Right Shoulder External Rotation  85 Degrees    Right Shoulder Horizontal ABduction  55 Degrees    Left Shoulder Flexion  167 Degrees    Left Shoulder ABduction  176 Degrees    Left Shoulder Internal Rotation  70 Degrees    Left Shoulder External Rotation  85 Degrees    Left Shoulder Horizontal ABduction  34 Degrees      Strength   Strength  Assessment Site  Shoulder    Right/Left Shoulder  Right;Left    Right Shoulder Flexion  5/5    Right Shoulder ABduction  5/5    Right Shoulder Internal Rotation  5/5    Right Shoulder External Rotation  5/5    Left Shoulder Flexion  5/5    Left Shoulder ABduction  5/5    Left Shoulder Internal Rotation  5/5    Left Shoulder External Rotation  5/5        LYMPHEDEMA/ONCOLOGY QUESTIONNAIRE - 03/28/20 1133      Surgeries   Number Lymph Nodes Removed  1   2005 for melanoma     Lymphedema Assessments   Lymphedema Assessments  Upper extremities      Right Upper Extremity Lymphedema   10 cm Proximal to Olecranon Process  28.4 cm    Olecranon Process  25.3 cm    10 cm Proximal to Ulnar Styloid Process  21.4 cm    Just Proximal to Ulnar Styloid Process  15.6 cm    Across Hand at [employer]  7.5 cm    At [place] of 2nd Digit  5.7 cm      Left Upper Extremity Lymphedema   10 cm Proximal to Olecranon Process  27.3 cm    Olecranon Process  26 cm    10 cm Proximal to Ulnar Styloid Process  21 cm    Just Proximal to Ulnar Styloid Process  15.2 cm    Across Hand at [employer]  17.6 cm    At [place] of 2nd Digit  5.5 cm      L-DEX FLOWSHEETS - 03/28/20 1100      L-DEX LYMPHEDEMA SCREENING   Measurement Type  Unilateral    L-DEX MEASUREMENT EXTREMITY  Upper Extremity    POSITION   Standing    DOMINANT SIDE  Right    At Risk Side  Right    BASELINE SCORE (UNILATERAL)  -1.8              Objective measurements completed on examination: See above findings.  PT Education - 03/28/20 1210    Education Details  POC, lymphedema risk, SOZO, HEP    Person(s) Educated  Patient    Methods  Explanation;Demonstration;Tactile cues;Handout    Comprehension  Verbalized understanding;Returned demonstration           Breast Clinic Goals - 03/28/20 1215      Patient will be able to verbalize understanding of pertinent lymphedema risk reduction practices  relevant to her diagnosis specifically related to skin care.   Status  Achieved      Patient will be able to return demonstrate and/or verbalize understanding of the post-op home exercise program related to regaining shoulder range of motion.   Status  Achieved      Patient will be able to verbalize understanding of the importance of attending the postoperative After Breast Cancer Class for further lymphedema risk reduction education and therapeutic exercise.   Status  Achieved            Plan - 03/28/20 1211    Clinical Impression Statement  Pt present pre-operatively for her Rt breast cancer stage IB prior to Rt lumpectomy and SLNB.  Pt has history of melanoma removal and 1 lymph node removed on that same side as well as intermittent shoulder pain in an overhead position.  Pt demonstrates excellent AROM without significant pain only some popping with IR.  Strength is also excellent and painfree.  Rt anterior rotation and protraction of the humeral head is noted in standing with the hands on the hips upon leaving so pt was given yellow band row and postural retractions to work on before surgery.  Pt will follow up 3 weeks post operatively to recheck ROM and prepare for radiation    Personal Factors and Comorbidities  Comorbidity 2    Comorbidities  Rt shoulder pain currently, lymph node removal previously    Stability/Clinical Decision Making  Stable/Uncomplicated    Clinical Decision Making  Low    Rehab Potential  Excellent    PT Frequency  --   2 visit   PT Duration  8 weeks    PT Treatment/Interventions  ADLs/Self Care Home Management;Patient/family education;Manual techniques    PT Next Visit Plan  post op check    PT Home Exercise Plan  post op stretches, yellow band rows    Consulted and Agree with Plan of Care  Patient       Patient will benefit from skilled therapeutic intervention in order to improve the following deficits and impairments:  Postural dysfunction,  Pain  Visit Diagnosis: Abnormal posture  Chronic right shoulder pain     Problem List Patient Active Problem List   Diagnosis Date Noted  . Carpal tunnel syndrome 03/09/2020  . Genetic testing 02/29/2020  . Family history of breast cancer   . Family history of ovarian cancer   . Family history of colon cancer   . Family history of prostate cancer   . Family history of skin cancer   . Family history of leukemia   . Malignant neoplasm of lower-outer quadrant of right breast of female, estrogen receptor positive ([place]) 02/02/2020  . Osteopenia 01/25/2020  . B12 deficiency 01/04/2020  . Hearing loss 08/18/2018  . Hyperlipidemia 11/28/2016  . Anxiety and depression 11/28/2016  . Chronic fatigue 11/28/2016  . Recurrent genital herpes 11/28/2016  . Melanoma of skin, site unspecified 06/26/2013    Shan Levans, PT  03/28/2020, 12:16 PM  [place], [place],  [address] Phone: [phone]   Fax:  [phone]  Name: Jean Morgan MRN: [id number] Date of Birth: 22-Mar-1961

## 2020-04-10 ENCOUNTER — Encounter: Payer: Self-pay | Admitting: *Deleted

## 2020-04-10 DIAGNOSIS — C50511 Malignant neoplasm of lower-outer quadrant of right female breast: Secondary | ICD-10-CM

## 2020-04-12 ENCOUNTER — Encounter: Payer: Self-pay | Admitting: Nurse Practitioner

## 2020-04-12 NOTE — Progress Notes (Signed)
[facility] [place], [place] - [address] AT [address] [place] [address] Phone: [phone] Fax: [phone]  [id number] [place], [place]. AT [address]. [place] [address] Phone: [phone] Fax: [phone]    Your procedure is scheduled on Thursday, June 24th.  Report to [facility] Main Entrance "A" at 5:30 A.M., and check in at the Admitting office.  Call this number if you have problems the morning of surgery:  [phone]  Call [phone] if you have any questions prior to your surgery date Monday-Friday 8am-4pm   Remember:  Do not eat  after midnight the night before your surgery  You may drink clear liquids until 4:30 A.M. the morning of your surgery.   Clear liquids allowed are: Water, Non-Citrus Juices (without pulp), Carbonated Beverages, Clear Tea, Black Coffee Only, and Gatorade  Please complete your PRE-SURGERY ENSURE that was provided to you by 4:30 A.M. the morning of surgery.  Please, if able, drink it in one setting. DO NOT SIP.   Take these medicines the morning of surgery with A SIP OF WATER anastrozole (ARIMIDEX)  citalopram (CELEXA)  valACYclovir (VALTREX)   If needed - diazepam (VALIUM)   As of today, STOP taking any Aspirin (unless otherwise instructed by your surgeon) and Aspirin containing products, Aleve, Naproxen, Ibuprofen, Motrin, Advil, Goody's, BC's, all herbal medications, fish oil, and all vitamins.                     Do not wear jewelry, make up, or nail polish            Do not wear lotions, powders, perfumes, or deodorant.            Do not shave 48 hours prior to surgery.              Do not bring valuables to the hospital.            [facility] is not responsible for any belongings or valuables.  Do NOT Smoke (Tobacco/Vapping) or drink Alcohol 24 hours prior to your procedure If you use a CPAP  at night, you may bring all equipment for your overnight stay.   Contacts, glasses, dentures or bridgework may not be worn into surgery.      For patients admitted to the hospital, discharge time will be determined by your treatment team.   Patients discharged the day of surgery will not be allowed to drive home, and someone needs to stay with them for 24 hours.  Special instructions:   [facility]- Preparing For Surgery  Before surgery, you can play an important role. Because skin is not sterile, your skin needs to be as free of germs as possible. You can reduce the number of germs on your skin by washing with CHG (chlorahexidine gluconate) Soap before surgery.  CHG is an antiseptic cleaner which kills germs and bonds with the skin to continue killing germs even after washing.    Oral Hygiene is also important to reduce your risk of infection.  Remember - BRUSH YOUR TEETH THE MORNING OF SURGERY WITH YOUR REGULAR TOOTHPASTE  Please do not use if you have an allergy to CHG or antibacterial soaps. If your skin becomes reddened/irritated stop using the CHG.  Do not shave (including legs and underarms) for at least 48 hours prior to first CHG shower. It is OK to shave your  face.  Please follow these instructions carefully.   1. Shower the NIGHT BEFORE SURGERY and the MORNING OF SURGERY with CHG Soap.   2. If you chose to wash your hair, wash your hair first as usual with your normal shampoo.  3. After you shampoo, rinse your hair and body thoroughly to remove the shampoo.  4. Use CHG as you would any other liquid soap. You can apply CHG directly to the skin and wash gently with a scrungie or a clean washcloth.   5. Apply the CHG Soap to your body ONLY FROM THE NECK DOWN.  Do not use on open wounds or open sores. Avoid contact with your eyes, ears, mouth and genitals (private parts). Wash Face and genitals (private parts)  with your normal soap.   6. Wash thoroughly, paying special attention  to the area where your surgery will be performed.  7. Thoroughly rinse your body with warm water from the neck down.  8. DO NOT shower/wash with your normal soap after using and rinsing off the CHG Soap.  9. Pat yourself dry with a CLEAN TOWEL.  10. Wear CLEAN PAJAMAS to bed the night before surgery, wear comfortable clothes the morning of surgery  11. Place CLEAN SHEETS on your bed the night of your first shower and DO NOT SLEEP WITH PETS.  Day of Surgery: Shower with CHG soap as instructed above.  Do not apply any deodorants/lotions.  Please wear clean clothes to the hospital/surgery center.   Remember to brush your teeth WITH YOUR REGULAR TOOTHPASTE.   Please read over the following fact sheets that you were given.

## 2020-04-13 ENCOUNTER — Other Ambulatory Visit: Payer: Self-pay

## 2020-04-13 ENCOUNTER — Encounter (HOSPITAL_COMMUNITY)
Admission: RE | Admit: 2020-04-13 | Discharge: 2020-04-13 | Disposition: A | Discharge status: Home / Self Care | Payer: BC Managed Care – PPO | Source: Ambulatory Visit | Attending: General Surgery | Admitting: General Surgery

## 2020-04-13 ENCOUNTER — Encounter (HOSPITAL_COMMUNITY): Payer: Self-pay

## 2020-04-13 DIAGNOSIS — Z8041 Family history of malignant neoplasm of ovary: Secondary | ICD-10-CM | POA: Insufficient documentation

## 2020-04-13 DIAGNOSIS — C50911 Malignant neoplasm of unspecified site of right female breast: Secondary | ICD-10-CM | POA: Insufficient documentation

## 2020-04-13 DIAGNOSIS — Z79811 Long term (current) use of aromatase inhibitors: Secondary | ICD-10-CM | POA: Diagnosis not present

## 2020-04-13 DIAGNOSIS — Z79899 Other long term (current) drug therapy: Secondary | ICD-10-CM | POA: Insufficient documentation

## 2020-04-13 DIAGNOSIS — Z85820 Personal history of malignant melanoma of skin: Secondary | ICD-10-CM | POA: Insufficient documentation

## 2020-04-13 DIAGNOSIS — Z803 Family history of malignant neoplasm of breast: Secondary | ICD-10-CM | POA: Insufficient documentation

## 2020-04-13 DIAGNOSIS — F419 Anxiety disorder, unspecified: Secondary | ICD-10-CM | POA: Insufficient documentation

## 2020-04-13 DIAGNOSIS — Z808 Family history of malignant neoplasm of other organs or systems: Secondary | ICD-10-CM | POA: Insufficient documentation

## 2020-04-13 DIAGNOSIS — Z8042 Family history of malignant neoplasm of prostate: Secondary | ICD-10-CM | POA: Insufficient documentation

## 2020-04-13 DIAGNOSIS — E039 Hypothyroidism, unspecified: Secondary | ICD-10-CM | POA: Diagnosis not present

## 2020-04-13 DIAGNOSIS — Z01818 Encounter for other preprocedural examination: Secondary | ICD-10-CM | POA: Insufficient documentation

## 2020-04-13 DIAGNOSIS — Z806 Family history of leukemia: Secondary | ICD-10-CM | POA: Diagnosis not present

## 2020-04-13 DIAGNOSIS — Z800 Family history of malignant neoplasm of digestive organs: Secondary | ICD-10-CM | POA: Insufficient documentation

## 2020-04-13 DIAGNOSIS — F329 Major depressive disorder, single episode, unspecified: Secondary | ICD-10-CM | POA: Diagnosis not present

## 2020-04-13 HISTORY — DX: Cardiac murmur, unspecified: R01.1

## 2020-04-13 HISTORY — DX: Unspecified optic neuritis: H46.9

## 2020-04-13 HISTORY — DX: Spotted fever due to Rickettsia rickettsii: A77.0

## 2020-04-13 HISTORY — DX: Anxiety disorder, unspecified: F41.9

## 2020-04-13 HISTORY — DX: Depression, unspecified: F32.A

## 2020-04-13 HISTORY — DX: Family history of other specified conditions: Z84.89

## 2020-04-13 LAB — BASIC METABOLIC PANEL
Anion gap: 11 (ref 5–15)
BUN: 11 mg/dL (ref 6–20)
CO2: 26 mmol/L (ref 22–32)
Calcium: 9.6 mg/dL (ref 8.9–10.3)
Chloride: 105 mmol/L (ref 98–111)
Creatinine, Ser: 0.78 mg/dL (ref 0.44–1.00)
GFR calc Af Amer: >60 mL/min (ref >60)
GFR calc non Af Amer: >60 mL/min (ref >60)
Glucose, Bld: 93 mg/dL (ref 70–99)
Potassium: 3.9 mmol/L (ref 3.5–5.1)
Sodium: 142 mmol/L (ref 135–145)

## 2020-04-13 LAB — CBC
HCT: 42.3 % (ref 36.0–46.0)
Hemoglobin: 13.7 g/dL (ref 12.0–15.0)
MCH: 30.8 pg (ref 26.0–34.0)
MCHC: 32.4 g/dL (ref 30.0–36.0)
MCV: 95.1 fL (ref 80.0–100.0)
Platelets: 325 10*3/uL (ref 150–400)
RBC: 4.45 MIL/uL (ref 3.87–5.11)
RDW: 12.7 % (ref 11.5–15.5)
WBC: 8.8 10*3/uL (ref 4.0–10.5)
nRBC: 0 % (ref 0.0–0.2)

## 2020-04-13 NOTE — Progress Notes (Signed)
[facility] [place], [place] - [address] [place] [address] Phone: [phone] Fax: [phone]  [id number] - [place], [place]. AT [address]. [place] [address] Phone: [phone] Fax: [phone]    Your procedure is scheduled on Thursday, June 24th.  Report to [facility] Main Entrance "A" at 5:30 A.M., and check in at the Admitting office.  Call this number if you have problems the morning of surgery:  [phone]  Call [phone] if you have any questions prior to your surgery date Monday-Friday 8am-4pm   Remember:  Do not eat  after midnight the night before your surgery  You may drink clear liquids until 4:30 A.M. the morning of your surgery.   Clear liquids allowed are: Water, Non-Citrus Juices (without pulp), Carbonated Beverages, Clear Tea, Black Coffee Only, and Gatorade    Take these medicines the morning of surgery with A SIP OF WATER anastrozole (ARIMIDEX)  citalopram (CELEXA)  valACYclovir (VALTREX)   If needed - diazepam (VALIUM)   As of today, STOP taking any Aspirin (unless otherwise instructed by your surgeon) and Aspirin containing products, Aleve, Naproxen, Ibuprofen, Motrin, Advil, Goody's, BC's, all herbal medications, fish oil, and all vitamins.                     Do not wear jewelry, make up, or nail polish            Do not wear lotions, powders, perfumes, or deodorant.            Do not shave 48 hours prior to surgery.              Do not bring valuables to the hospital.            [facility] is not responsible for any belongings or valuables.  Do NOT Smoke (Tobacco/Vapping) or drink Alcohol 24 hours prior to your procedure If you use a CPAP at night, you may bring all equipment for your overnight stay.   Contacts, glasses, dentures or bridgework may not be worn into surgery.      For patients  admitted to the hospital, discharge time will be determined by your treatment team.   Patients discharged the day of surgery will not be allowed to drive home, and someone needs to stay with them for 24 hours.  Special instructions:   [place]- Preparing For Surgery  Before surgery, you can play an important role. Because skin is not sterile, your skin needs to be as free of germs as possible. You can reduce the number of germs on your skin by washing with CHG (chlorahexidine gluconate) Soap before surgery.  CHG is an antiseptic cleaner which kills germs and bonds with the skin to continue killing germs even after washing.    Oral Hygiene is also important to reduce your risk of infection.  Remember - BRUSH YOUR TEETH THE MORNING OF SURGERY WITH YOUR REGULAR TOOTHPASTE  Please do not use if you have an allergy to CHG or antibacterial soaps. If your skin becomes reddened/irritated stop using the CHG.  Do not shave (including legs and underarms) for at least 48 hours prior to first CHG shower. It is OK to shave your face.  Please follow these instructions carefully.   1. Shower the NIGHT BEFORE SURGERY and the MORNING OF SURGERY with CHG Soap.   2. If you chose  to wash your hair, wash your hair first as usual with your normal shampoo.  3. After you shampoo, rinse your hair and body thoroughly to remove the shampoo.  4. Use CHG as you would any other liquid soap. You can apply CHG directly to the skin and wash gently with a scrungie or a clean washcloth.   5. Apply the CHG Soap to your body ONLY FROM THE NECK DOWN.  Do not use on open wounds or open sores. Avoid contact with your eyes, ears, mouth and genitals (private parts). Wash Face and genitals (private parts)  with your normal soap.   6. Wash thoroughly, paying special attention to the area where your surgery will be performed.  7. Thoroughly rinse your body with warm water from the neck down.  8. DO NOT shower/wash with your  normal soap after using and rinsing off the CHG Soap.  9. Pat yourself dry with a CLEAN TOWEL.  10. Wear CLEAN PAJAMAS to bed the night before surgery, wear comfortable clothes the morning of surgery  11. Place CLEAN SHEETS on your bed the night of your first shower and DO NOT SLEEP WITH PETS.  Day of Surgery: Shower with CHG soap as instructed above.  Do not apply any deodorants/lotions.  Please wear clean clothes to the hospital/surgery center.   Remember to brush your teeth WITH YOUR REGULAR TOOTHPASTE.   Please read over the following fact sheets that you were given.

## 2020-04-13 NOTE — Progress Notes (Signed)
PCP: Dr. [name] Cardiologist: denies  EKG: 04/26/2019 CXR: n/a ECHO: denies Stress Test: denies Cardiac Cath: denies  Going for Covid test 6/21  ERAS protocol, no drink ordered  Patient denies shortness of breath, fever, cough, and chest pain at PAT appointment.  Patient verbalized understanding of instructions provided today at the PAT appointment.  Patient asked to review instructions at home and day of surgery.

## 2020-04-16 ENCOUNTER — Other Ambulatory Visit (HOSPITAL_COMMUNITY)
Admission: RE | Admit: 2020-04-16 | Discharge: 2020-04-16 | Disposition: A | Discharge status: Home / Self Care | Payer: BC Managed Care – PPO | Source: Ambulatory Visit | Attending: General Surgery | Admitting: General Surgery

## 2020-04-16 DIAGNOSIS — Z01812 Encounter for preprocedural laboratory examination: Secondary | ICD-10-CM | POA: Diagnosis not present

## 2020-04-16 DIAGNOSIS — Z20822 Contact with and (suspected) exposure to covid-19: Secondary | ICD-10-CM | POA: Diagnosis not present

## 2020-04-16 LAB — SARS CORONAVIRUS 2 (TAT 6-24 HRS): SARS Coronavirus 2: NEGATIVE

## 2020-04-16 NOTE — Progress Notes (Signed)
Anesthesia Chart Review:  Case: [id number] Date/Time: 04/19/20 0715   Procedure: RIGHT BREAST LUMPECTOMY WITH RADIOACTIVE SEED AND SENTINEL LYMPH NODE BIOPSY (Right Breast) - PEC BLOCK   Anesthesia type: General   Pre-op diagnosis: RIGHT BREAST CANCER   Location: [facility] OR ROOM 01 / [facility]   Surgeons: [name], MD    RSL scheduled for 04/18/20.   DISCUSSION: Patient is a 59 year old female scheduled for the above procedure.  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 Reported brother had severe agitation coming out anesthesia and had to be re-intubated.  Cardiac exams performed by Truitt Merle, MD on 02/08/20 documented, "HEART: regular rate & rhythm and no murmurs and no lower extremity edema"; and exam by [name], Betty, MD on 01/04/20 documented, "Cardiovascular: Normal rate and regular rhythm.  No murmur heard."  I called and spoke with patient regarding "slight murmur" history, as patient no longer at PAT. She reported that some providers have been able to hear a slight murmur, but other providers have not been able to hear a murmur. (As above, no murmur noted on 02/08/20 and 01/04/20 visits.) No chest pain, syncope, edema. She questioned whether she may have had a false negative COVID-19 test approximately six months ago because she did have some SOB after exposure to COVID-19, but says that has improved. When it's not too hot, she goes walking (6000 steps) without cardiopulmonary symptoms and can go up flights of stairs without issues. She said her brother and daughter become agitated when coming out of anesthesia  (confirms brother was re-intubated due to agitation and not because of not breathing). She asked about cetirizine on the morning of surgery because if not, she will likely be congested. She is not taking cetirizine with pseudoephedrine, so I told her she could take cetirizine on the morning of surgery if needed.     04/16/20 COVID-19 test is in process. Anesthesia team to evaluate on the day of surgery.    VS: BP 132/70   Pulse 82   Temp 37.1 C (Oral)   Resp 20   Ht 5\' 6"  (1.676 m)   Wt 65.5 kg   SpO2 99%   BMI 23.31 kg/m    PROVIDERS: [name], [name], MD is PCP - Truitt Merle, MD is MED-ONC. Also saw Cecil Cobbs, MD with [facility] on 03/09/20 for episodes of paresthesias and numbness in her hands and felt to have carpal tunnel syndrome he recommended nocturnal soft splinting for management and deferred EMG or further work-up given nature of symptoms and intact motor function.  Could consider local corticosteroid injections in the future.    [name], MD is RAD-ONC   LABS: Labs reviewed: Acceptable for surgery. (all labs ordered are listed, but only abnormal results are displayed)  Labs Reviewed  BASIC METABOLIC PANEL  CBC     IMAGES: MRI Breast 02/17/20: IMPRESSION: - Known malignancy in the LOWER central portion of the RIGHT breast measuring 3.1 centimeters. - No adenopathy in the RIGHT axilla. - No additional areas of concern are identified in either breast. RECOMMENDATION: - Treatment plan for known right breast malignancy. - BI-RADS CATEGORY  6: Known biopsy-proven malignancy. ADDENDUM (by Nolon Nations, MD): - The mass  in the LOWER central portion of the RIGHT breast appears discrete from the pectoralis muscle. There is at least 6 millimeters of normal appearing fat interposed between the posterior margin of the mass and the anterior margin of the RIGHT pectoralis muscle. - Findings are discussed with Dr. Burr Medico.   US Thyroid 01/06/20: IMPRESSION: - 1.4 cm  right superior TR 2 nodule does not meet criteria for biopsy or follow-up. - Nonspecific thyroid heterogeneity. - Mildly prominent nonspecific right submandibular lymph node The above is in keeping with the ACR TI-RADS recommendations - J Am Coll Radiol 2017;14:587-595.  CXR 01/04/20 FINDINGS: Heart and mediastinal contours are within normal limits. No focal opacities or effusions. No acute bony abnormality. IMPRESSION: Normal study.   EKG: 04/26/19: Sinus  Rhythm  WITHIN NORMAL LIMITS   CV: Denied prior stress test, cardiac cath, and echocardiogram.    Past Medical History:  Diagnosis Date  . Anxiety   . Cancer ([place])    skin  . Depression   . Family history of adverse reaction to anesthesia    brother had severe agitation coming out of anesthesia, had to be re-intubated  . Family history of breast cancer   . Family history of colon cancer   . Family history of leukemia   . Family history of ovarian cancer   . Family history of prostate cancer   . Family history of skin cancer   . Heart murmur    "slight"  . Hypothyroidism   . Optic neuritis   . 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 DENTAL SURGERY    . MELANOMA EXCISION  2006   Right forearm  . SENTINEL NODE BIOPSY Right 2006  . TONSILLECTOMY    . TONSILLECTOMY      MEDICATIONS: . cetirizine (ZYRTEC) 5 MG tablet  . anastrozole (ARIMIDEX) 1 MG tablet  . Calcium-Vitamin D-Vitamin K 500-100-40 MG-UNT-MCG CHEW  . citalopram (CELEXA) 10 MG tablet  . diazepam (VALIUM) 5 MG tablet  . valACYclovir (VALTREX) 1000 MG tablet   No current facility-administered medications for this encounter.    Myra Gianotti, PA-C Surgical Short Stay/Anesthesiology [facility] Phone [phone] [facility] Phone [phone] 04/16/2020 3:42 PM

## 2020-04-16 NOTE — Anesthesia Preprocedure Evaluation (Addendum)
Anesthesia Evaluation  Patient identified by MRN, date of birth, ID band Patient awake    Reviewed: Allergy & Precautions, NPO status , Patient's Chart, lab work & pertinent test results  History of Anesthesia Complications (+) Family history of anesthesia reaction  Airway Mallampati: I  TM Distance: >3 FB Neck ROM: Full    Dental no notable dental hx. (+) Teeth Intact   Pulmonary neg pulmonary ROS,    Pulmonary exam normal breath sounds clear to auscultation       Cardiovascular negative cardio ROS Normal cardiovascular exam+ Valvular Problems/Murmurs  Rhythm:Regular Rate:Normal     Neuro/Psych PSYCHIATRIC DISORDERS Anxiety Depression  Neuromuscular disease    GI/Hepatic negative GI ROS, Neg liver ROS,   Endo/Other  Hypothyroidism Right breast Ca  Renal/GU negative Renal ROS  negative genitourinary   Musculoskeletal negative musculoskeletal ROS (+)   Abdominal   Peds  Hematology negative hematology ROS (+)   Anesthesia Other Findings   Reproductive/Obstetrics Genital Herpes                            Anesthesia Physical Anesthesia Plan  ASA: II  Anesthesia Plan: General   Post-op Pain Management:  Regional for Post-op pain   Induction: Intravenous  PONV Risk Score and Plan: 4 or greater and Midazolam, Scopolamine patch - Pre-op, Dexamethasone, Ondansetron and Treatment may vary due to age or medical condition  Airway Management Planned: LMA  Additional Equipment:   Intra-op Plan:   Post-operative Plan: Extubation in OR  Informed Consent: I have reviewed the patients History and Physical, chart, labs and discussed the procedure including the risks, benefits and alternatives for the proposed anesthesia with the patient or authorized representative who has indicated his/her understanding and acceptance.     Dental advisory given  Plan Discussed with: CRNA and  Surgeon  Anesthesia Plan Comments: (PAT note written by Myra Gianotti, PA-C. )      Anesthesia Quick Evaluation

## 2020-04-17 ENCOUNTER — Encounter: Payer: Self-pay | Admitting: *Deleted

## 2020-04-18 ENCOUNTER — Encounter (HOSPITAL_COMMUNITY): Payer: Self-pay | Admitting: General Surgery

## 2020-04-18 ENCOUNTER — Ambulatory Visit
Admission: RE | Admit: 2020-04-18 | Discharge: 2020-04-18 | Disposition: A | Discharge status: Home / Self Care | Payer: BC Managed Care – PPO | Source: Ambulatory Visit | Attending: General Surgery | Admitting: General Surgery

## 2020-04-18 ENCOUNTER — Other Ambulatory Visit: Payer: Self-pay

## 2020-04-18 DIAGNOSIS — C50511 Malignant neoplasm of lower-outer quadrant of right female breast: Secondary | ICD-10-CM

## 2020-04-19 ENCOUNTER — Ambulatory Visit (HOSPITAL_COMMUNITY)
Admission: RE | Admit: 2020-04-19 | Discharge: 2020-04-19 | Disposition: A | Discharge status: Home / Self Care | Payer: BC Managed Care – PPO | Attending: General Surgery | Admitting: General Surgery

## 2020-04-19 ENCOUNTER — Encounter (HOSPITAL_COMMUNITY)
Admission: RE | Admit: 2020-04-19 | Discharge: 2020-04-19 | Disposition: A | Discharge status: Home / Self Care | Payer: BC Managed Care – PPO | Source: Ambulatory Visit | Attending: General Surgery | Admitting: General Surgery

## 2020-04-19 ENCOUNTER — Ambulatory Visit (HOSPITAL_COMMUNITY): Payer: BC Managed Care – PPO | Admitting: Vascular Surgery

## 2020-04-19 ENCOUNTER — Ambulatory Visit (HOSPITAL_COMMUNITY): Payer: BC Managed Care – PPO | Admitting: Anesthesiology

## 2020-04-19 ENCOUNTER — Other Ambulatory Visit: Payer: Self-pay

## 2020-04-19 ENCOUNTER — Encounter (HOSPITAL_COMMUNITY)
Admission: RE | Disposition: A | Discharge status: Home / Self Care | Payer: Self-pay | Source: Home / Self Care | Attending: General Surgery

## 2020-04-19 ENCOUNTER — Encounter (HOSPITAL_COMMUNITY): Payer: Self-pay | Admitting: General Surgery

## 2020-04-19 ENCOUNTER — Ambulatory Visit
Admission: RE | Admit: 2020-04-19 | Discharge: 2020-04-19 | Disposition: A | Discharge status: Home / Self Care | Payer: BC Managed Care – PPO | Source: Ambulatory Visit | Attending: General Surgery | Admitting: General Surgery

## 2020-04-19 DIAGNOSIS — Z85820 Personal history of malignant melanoma of skin: Secondary | ICD-10-CM | POA: Diagnosis not present

## 2020-04-19 DIAGNOSIS — C50511 Malignant neoplasm of lower-outer quadrant of right female breast: Secondary | ICD-10-CM | POA: Diagnosis not present

## 2020-04-19 DIAGNOSIS — Z170 Estrogen receptor positive status [ER+]: Secondary | ICD-10-CM | POA: Diagnosis not present

## 2020-04-19 DIAGNOSIS — Z803 Family history of malignant neoplasm of breast: Secondary | ICD-10-CM | POA: Insufficient documentation

## 2020-04-19 HISTORY — PX: BREAST LUMPECTOMY WITH RADIOACTIVE SEED AND SENTINEL LYMPH NODE BIOPSY: SHX6550

## 2020-04-19 HISTORY — PX: BREAST LUMPECTOMY: SHX2

## 2020-04-19 SURGERY — BREAST LUMPECTOMY WITH RADIOACTIVE SEED AND SENTINEL LYMPH NODE BIOPSY
Anesthesia: General | Site: Breast | Laterality: Right

## 2020-04-19 MED ORDER — TECHNETIUM TC 99M SULFUR COLLOID FILTERED
1.0000 | Freq: Once | INTRAVENOUS | Status: AC | PRN
  Administered 2020-04-19: 1 via INTRADERMAL

## 2020-04-19 MED ORDER — FENTANYL CITRATE (PF) 250 MCG/5ML IJ SOLN
INTRAMUSCULAR | Status: AC
  Filled 2020-04-19: qty 5

## 2020-04-19 MED ORDER — HYDROCODONE-ACETAMINOPHEN 5-325 MG PO TABS: 1.0000 | ORAL_TABLET | Freq: Four times a day (QID) | ORAL | 0 refills | Status: DC | PRN

## 2020-04-19 MED ORDER — ONDANSETRON HCL 4 MG/2ML IJ SOLN
INTRAMUSCULAR | Status: DC | PRN
  Administered 2020-04-19: 4 mg via INTRAVENOUS

## 2020-04-19 MED ORDER — CHLORHEXIDINE GLUCONATE CLOTH 2 % EX PADS: 6.0000 | MEDICATED_PAD | Freq: Once | CUTANEOUS | Status: DC

## 2020-04-19 MED ORDER — GABAPENTIN 300 MG PO CAPS
300.0000 mg | ORAL_CAPSULE | ORAL | Status: AC
  Administered 2020-04-19: 300 mg via ORAL
  Filled 2020-04-19: qty 1

## 2020-04-19 MED ORDER — MIDAZOLAM HCL 2 MG/2ML IJ SOLN
INTRAMUSCULAR | Status: AC
  Filled 2020-04-19: qty 2

## 2020-04-19 MED ORDER — FENTANYL CITRATE (PF) 100 MCG/2ML IJ SOLN
INTRAMUSCULAR | Status: DC | PRN
  Administered 2020-04-19 (×3): 50 ug via INTRAVENOUS

## 2020-04-19 MED ORDER — ORAL CARE MOUTH RINSE: 15.0000 mL | Freq: Once | OROMUCOSAL | Status: AC

## 2020-04-19 MED ORDER — MEPERIDINE HCL 25 MG/ML IJ SOLN: 6.2500 mg | INTRAMUSCULAR | Status: DC | PRN

## 2020-04-19 MED ORDER — METHYLENE BLUE 0.5 % INJ SOLN
INTRAVENOUS | Status: AC
  Filled 2020-04-19: qty 10

## 2020-04-19 MED ORDER — CEFAZOLIN SODIUM-DEXTROSE 2-4 GM/100ML-% IV SOLN
2.0000 g | INTRAVENOUS | Status: AC
  Administered 2020-04-19: 2 g via INTRAVENOUS
  Filled 2020-04-19: qty 100

## 2020-04-19 MED ORDER — BUPIVACAINE-EPINEPHRINE (PF) 0.5% -1:200000 IJ SOLN
INTRAMUSCULAR | Status: DC | PRN
  Administered 2020-04-19: 30 mL via PERINEURAL

## 2020-04-19 MED ORDER — LIDOCAINE 2% (20 MG/ML) 5 ML SYRINGE
INTRAMUSCULAR | Status: AC
  Filled 2020-04-19: qty 5

## 2020-04-19 MED ORDER — FENTANYL CITRATE (PF) 100 MCG/2ML IJ SOLN
25.0000 ug | INTRAMUSCULAR | Status: DC | PRN
  Administered 2020-04-19: 25 ug via INTRAVENOUS

## 2020-04-19 MED ORDER — DEXAMETHASONE SODIUM PHOSPHATE 4 MG/ML IJ SOLN
INTRAMUSCULAR | Status: DC | PRN
  Administered 2020-04-19: 4 mg via INTRAVENOUS

## 2020-04-19 MED ORDER — MIDAZOLAM HCL 5 MG/5ML IJ SOLN
INTRAMUSCULAR | Status: DC | PRN
  Administered 2020-04-19: 2 mg via INTRAVENOUS

## 2020-04-19 MED ORDER — FENTANYL CITRATE (PF) 100 MCG/2ML IJ SOLN
INTRAMUSCULAR | Status: AC
  Filled 2020-04-19: qty 2

## 2020-04-19 MED ORDER — ACETAMINOPHEN 500 MG PO TABS
1000.0000 mg | ORAL_TABLET | ORAL | Status: AC
  Administered 2020-04-19: 1000 mg via ORAL
  Filled 2020-04-19: qty 2

## 2020-04-19 MED ORDER — CHLORHEXIDINE GLUCONATE 0.12 % MT SOLN
15.0000 mL | Freq: Once | OROMUCOSAL | Status: AC
  Administered 2020-04-19: 15 mL via OROMUCOSAL
  Filled 2020-04-19: qty 15

## 2020-04-19 MED ORDER — ONDANSETRON HCL 4 MG/2ML IJ SOLN
INTRAMUSCULAR | Status: AC
  Filled 2020-04-19: qty 2

## 2020-04-19 MED ORDER — ONDANSETRON HCL 4 MG/2ML IJ SOLN: 4.0000 mg | Freq: Once | INTRAMUSCULAR | Status: DC | PRN

## 2020-04-19 MED ORDER — CELECOXIB 200 MG PO CAPS
200.0000 mg | ORAL_CAPSULE | ORAL | Status: AC
  Administered 2020-04-19: 200 mg via ORAL
  Filled 2020-04-19: qty 1

## 2020-04-19 MED ORDER — EPHEDRINE SULFATE 50 MG/ML IJ SOLN
INTRAMUSCULAR | Status: DC | PRN
  Administered 2020-04-19: 5 mg via INTRAVENOUS

## 2020-04-19 MED ORDER — PROPOFOL 10 MG/ML IV BOLUS
INTRAVENOUS | Status: DC | PRN
  Administered 2020-04-19: 150 mg via INTRAVENOUS

## 2020-04-19 MED ORDER — 0.9 % SODIUM CHLORIDE (POUR BTL) OPTIME
TOPICAL | Status: DC | PRN
  Administered 2020-04-19: 1000 mL

## 2020-04-19 MED ORDER — BUPIVACAINE HCL (PF) 0.25 % IJ SOLN
INTRAMUSCULAR | Status: DC | PRN
  Administered 2020-04-19: 20 mL

## 2020-04-19 MED ORDER — PROPOFOL 10 MG/ML IV BOLUS
INTRAVENOUS | Status: AC
  Filled 2020-04-19: qty 20

## 2020-04-19 MED ORDER — SODIUM CHLORIDE (PF) 0.9 % IJ SOLN
INTRAMUSCULAR | Status: AC
  Filled 2020-04-19: qty 10

## 2020-04-19 MED ORDER — LACTATED RINGERS IV SOLN: INTRAVENOUS | Status: DC | PRN

## 2020-04-19 MED ORDER — DEXAMETHASONE SODIUM PHOSPHATE 10 MG/ML IJ SOLN
INTRAMUSCULAR | Status: AC
  Filled 2020-04-19: qty 1

## 2020-04-19 SURGICAL SUPPLY — 37 items
ADH SKN CLS APL DERMABOND .7 (GAUZE/BANDAGES/DRESSINGS) ×2
APPLIER CLIP 9.375 MED OPEN (MISCELLANEOUS) ×3
BINDER BREAST LRG (GAUZE/BANDAGES/DRESSINGS) ×3 IMPLANT
BINDER BREAST XLRG (GAUZE/BANDAGES/DRESSINGS) IMPLANT
CANISTER SUCT 3000ML PPV (MISCELLANEOUS) ×3 IMPLANT
CHLORAPREP W/TINT 26 (MISCELLANEOUS) ×3 IMPLANT
CLIP APPLIE 9.375 MED OPEN (MISCELLANEOUS) ×1 IMPLANT
CNTNR URN SCR LID CUP LEK RST (MISCELLANEOUS) ×1 IMPLANT
CONT SPEC 4OZ STRL OR WHT (MISCELLANEOUS) ×3
COVER PROBE W GEL 5X96 (DRAPES) ×3 IMPLANT
COVER SURGICAL LIGHT HANDLE (MISCELLANEOUS) ×3 IMPLANT
DERMABOND ADVANCED (GAUZE/BANDAGES/DRESSINGS) ×4
DERMABOND ADVANCED .7 DNX12 (GAUZE/BANDAGES/DRESSINGS) ×2 IMPLANT
DEVICE DUBIN SPECIMEN MAMMOGRA (MISCELLANEOUS) ×3 IMPLANT
DRAPE CHEST BREAST 15X10 FENES (DRAPES) ×3 IMPLANT
ELECT COATED BLADE 2.86 ST (ELECTRODE) ×3 IMPLANT
ELECT REM PT RETURN 9FT ADLT (ELECTROSURGICAL) ×3
ELECTRODE REM PT RTRN 9FT ADLT (ELECTROSURGICAL) ×1 IMPLANT
GAUZE SPONGE 4X4 12PLY STRL (GAUZE/BANDAGES/DRESSINGS) ×3 IMPLANT
GOWN STRL REUS W/ TWL LRG LVL3 (GOWN DISPOSABLE) ×3 IMPLANT
GOWN STRL REUS W/TWL LRG LVL3 (GOWN DISPOSABLE) ×9
KIT BASIN OR (CUSTOM PROCEDURE TRAY) ×3 IMPLANT
KIT MARKER MARGIN INK (KITS) ×3 IMPLANT
LIGHT WAVEGUIDE WIDE FLAT (MISCELLANEOUS) ×3 IMPLANT
MARKER SKIN DUAL TIP RULER LAB (MISCELLANEOUS) ×3 IMPLANT
NEEDLE 18GX1X1/2 (RX/OR ONLY) (NEEDLE) IMPLANT
NEEDLE FILTER BLUNT 18X 1/2SAF (NEEDLE)
NEEDLE FILTER BLUNT 18X1 1/2 (NEEDLE) IMPLANT
NEEDLE HYPO 25GX1X1/2 BEV (NEEDLE) ×3 IMPLANT
NS IRRIG 1000ML POUR BTL (IV SOLUTION) ×3 IMPLANT
PACK GENERAL/GYN (CUSTOM PROCEDURE TRAY) ×3 IMPLANT
PENCIL SMOKE EVACUATOR (MISCELLANEOUS) ×3 IMPLANT
SUT MNCRL AB 4-0 PS2 18 (SUTURE) ×6 IMPLANT
SUT VIC AB 3-0 SH 18 (SUTURE) ×6 IMPLANT
SYR CONTROL 10ML LL (SYRINGE) ×3 IMPLANT
TOWEL GREEN STERILE (TOWEL DISPOSABLE) ×3 IMPLANT
TOWEL GREEN STERILE FF (TOWEL DISPOSABLE) ×3 IMPLANT

## 2020-04-19 NOTE — Progress Notes (Signed)
Nuclear medicine called at [phone] that anesthesia was starting the block and the patient will be ready for their injection.  At [facility] nuclear medicine called again to say the block was complete and the patient is ready for her injection.

## 2020-04-19 NOTE — Anesthesia Procedure Notes (Signed)
Procedure Name: LMA Insertion Date/Time: 04/19/2020 8:25 AM Performed by: Amadeo Garnet, CRNA Pre-anesthesia Checklist: Patient identified, Emergency Drugs available, Suction available and Patient being monitored Patient Re-evaluated:Patient Re-evaluated prior to induction Oxygen Delivery Method: Circle system utilized Preoxygenation: Pre-oxygenation with 100% oxygen Induction Type: IV induction Ventilation: Mask ventilation without difficulty LMA: LMA inserted LMA Size: 4.0 Placement Confirmation: positive ETCO2 and breath sounds checked- equal and bilateral Tube secured with: Tape Dental Injury: Teeth and Oropharynx as per pre-operative assessment

## 2020-04-19 NOTE — H&P (Signed)
Jean Morgan  Location: [facility] Patient #: [id number] DOB: 07-31-1961 Undefined / Language: Cleophus Molt / Race: White Female   History of Present Illness The patient is a 59 year old female who presents with breast cancer.We are asked to see the patient in consultation by Dr. Burr Medico to evaluate her for a new right breast cancer. the patient was initially evaluated for pain all over. The patient is a 59 year old female who presents with a mass in the lower central right breast that measured 2.3 cm. the axilla looked neg. 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 The mass abuts the muscle of the chest wall. She has a h/o right sentinel node bx for melanoma. she does not smoke. she has a h/o breast cancer in a M grandmother and great aunt   Past Surgical History  Breast Biopsy  Right. Oral Surgery  Tonsillectomy   Diagnostic Studies History ( Colonoscopy  1-5 years ago Mammogram  within last year Pap Smear  >5 years ago  Medication History Medications Reconciled  Social History  Alcohol use  Moderate alcohol use. Caffeine use  Coffee. No drug use  Tobacco use  Never smoker.  Family History  Hypertension  Father. Respiratory Condition  Mother.  Pregnancy / Birth History  Age at menarche  59 years. Age of menopause  51-55 Contraceptive History  Oral contraceptives. Gravida  1 Maternal age  77-40 Para  1 Regular periods   Other Problems Anxiety Disorder  Depression  Heart murmur  Hypercholesterolemia  Melanoma  Thyroid Disease     Review of Systems  General Not Present- Appetite Loss, Chills, Fatigue, Fever, Night Sweats, Weight Gain and Weight Loss. Skin Not Present- Change in Wart/Mole, Dryness, Hives, Jaundice, New Lesions, Non-Healing Wounds, Rash and Ulcer. HEENT Present- Seasonal Allergies and Wears glasses/contact lenses. Not Present- Earache, Hearing Loss,  Hoarseness, Nose Bleed, Oral Ulcers, Ringing in the Ears, Sinus Pain, Sore Throat, Visual Disturbances and Yellow Eyes. Breast Present- Breast Pain. Not Present- Breast Mass, Nipple Discharge and Skin Changes. Cardiovascular Not Present- Chest Pain, Difficulty Breathing Lying Down, Leg Cramps, Palpitations, Rapid Heart Rate, Shortness of Breath and Swelling of Extremities. Gastrointestinal Not Present- Abdominal Pain, Bloating, Bloody Stool, Change in Bowel Habits, Chronic diarrhea, Constipation, Difficulty Swallowing, Excessive gas, Gets full quickly at meals, Hemorrhoids, Indigestion, Nausea, Rectal Pain and Vomiting. Female Genitourinary Not Present- Frequency, Nocturia, Painful Urination, Pelvic Pain and Urgency. Musculoskeletal Not Present- Back Pain, Joint Pain, Joint Stiffness, Muscle Pain, Muscle Weakness and Swelling of Extremities. Neurological Not Present- Decreased Memory, Fainting, Headaches, Numbness, Seizures, Tingling, Tremor, Trouble walking and Weakness. Psychiatric Present- Anxiety and Frequent crying. Not Present- Bipolar, Change in Sleep Pattern, Depression and Fearful. Endocrine Present- Cold Intolerance. Not Present- Excessive Hunger, Hair Changes, Heat Intolerance, Hot flashes and New Diabetes. Hematology Not Present- Blood Thinners, Easy Bruising, Excessive bleeding, Gland problems, HIV and Persistent Infections.   Physical Exam  General Mental Status-Alert. General Appearance-Consistent with stated age. Hydration-Well hydrated. Voice-Normal.  Head and Neck Head-normocephalic, atraumatic with no lesions or palpable masses. Trachea-midline. Thyroid Gland Characteristics - normal size and consistency.  Eye Eyeball - Bilateral-Extraocular movements intact. Sclera/Conjunctiva - Bilateral-No scleral icterus.  Chest and Lung Exam Chest and lung exam reveals -quiet, even and easy respiratory effort with no use of accessory muscles and on auscultation,  normal breath sounds, no adventitious sounds and normal vocal resonance. Inspection Chest Wall -  Normal. Back - normal.  Breast Note: there is a vague palpable fullness which may be bruising in the lower right breast that is mobile. There is no other palpable mass in either breast. there is no palpable axillary, supraclavicular, or cervical lymphadenopathy   Cardiovascular Cardiovascular examination reveals -normal heart sounds, regular rate and rhythm with no murmurs and normal pedal pulses bilaterally.  Abdomen Inspection Inspection of the abdomen reveals - No Hernias. Skin - Scar - no surgical scars. Palpation/Percussion Palpation and Percussion of the abdomen reveal - Soft, Non Tender, No Rebound tenderness, No Rigidity (guarding) and No hepatosplenomegaly. Auscultation Auscultation of the abdomen reveals - Bowel sounds normal.  Neurologic Neurologic evaluation reveals -alert and oriented x 3 with no impairment of recent or remote memory. Mental Status-Normal.  Musculoskeletal Normal Exam - Left-Upper Extremity Strength Normal and Lower Extremity Strength Normal. Normal Exam - Right-Upper Extremity Strength Normal and Lower Extremity Strength Normal.  Lymphatic Head & Neck  General Head & Neck Lymphatics: Bilateral - Description - Normal. Axillary  General Axillary Region: Bilateral - Description - Normal. Tenderness - Non Tender. Femoral & Inguinal  Generalized Femoral & Inguinal Lymphatics: Bilateral - Description - Normal. Tenderness - Non Tender.    Assessment & Plan  MALIGNANT NEOPLASM OF LOWER-OUTER QUADRANT OF RIGHT BREAST OF FEMALE, ESTROGEN RECEPTOR POSITIVE (C50.511) Impression: the patient appears to have a 2.3cm cancer abutting the pec muscle of the lower right breast. Our plan is to evaluate this further with MRI and Oncotype is there is enough tissue. If she would benefit from chemo then this would be given first to try to shrink the cancer. If  not then she could try neoadjuvant antiestrogens. we will call her with the results of MRI and then proceed accordingly. otherwise she favors breast conservation and sentinel node biopsy. I have discussed with her the risks and benefits of the surgery as well as some of the technical aspects including the use of radioactive seed and she understands and agrees. This patient encounter took 60 minutes today to perform the following: take history, perform exam, review outside records, interpret imaging, counsel the patient on their diagnosis and document encounter, findings & plan in the EHR

## 2020-04-19 NOTE — Transfer of Care (Signed)
Immediate Anesthesia Transfer of Care Note  Patient: Jean Morgan  Procedure(s) Performed: RIGHT BREAST LUMPECTOMY WITH RADIOACTIVE SEED AND SENTINEL LYMPH NODE BIOPSY (Right Breast)  Patient Location: PACU  Anesthesia Type:GA combined with regional for post-op pain  Level of Consciousness: awake, alert  and oriented  Airway & Oxygen Therapy: Patient Spontanous Breathing  Post-op Assessment: Report given to RN, Post -op Vital signs reviewed and stable and Patient moving all extremities  Post vital signs: Reviewed and stable  Last Vitals:  Vitals Value Taken Time  BP 172/78 04/19/20 0956  Temp    Pulse 100 04/19/20 0956  Resp 19 04/19/20 0956  SpO2 96 % 04/19/20 0956  Vitals shown include unvalidated device data.  Last Pain:  Vitals:   04/19/20 0559  TempSrc: Oral  PainSc:       Patients Stated Pain Goal: 3 (56/70/14 1030)  Complications: No complications documented.

## 2020-04-19 NOTE — Anesthesia Procedure Notes (Signed)
Anesthesia Regional Block: Pectoralis block   Pre-Anesthetic Checklist: ,, timeout performed, Correct Patient, Correct Site, Correct Laterality, Correct Procedure, Correct Position, site marked, Risks and benefits discussed,  Surgical consent,  Pre-op evaluation,  At surgeon's request and post-op pain management  Laterality: Right  Prep: chloraprep       Needles:  Injection technique: Single-shot  Needle Type: Echogenic Stimulator Needle     Needle Length: 9cm  Needle Gauge: 21   Needle insertion depth: 7 cm   Additional Needles:   Procedures:,,,, ultrasound used (permanent image in chart),,,,  Narrative:  Start time: 04/19/2020 7:10 AM End time: 04/19/2020 7:15 AM Injection made incrementally with aspirations every 5 mL.  Performed by: Personally  Anesthesiologist: Josephine Igo, MD  Additional Notes: Timeout performed. Patient sedated. Relevant anatomy ID'd using [place]. Incremental 2-59ml injection of LA with frequent aspiration. Patient tolerated procedure well.        Right Pectoralis Block

## 2020-04-19 NOTE — Interval H&P Note (Signed)
History and Physical Interval Note:  04/19/2020 8:07 AM  Jean Morgan  has presented today for surgery, with the diagnosis of RIGHT BREAST CANCER.  The various methods of treatment have been discussed with the patient and family. After consideration of risks, benefits and other options for treatment, the patient has consented to  Procedure(s) with comments: RIGHT BREAST LUMPECTOMY WITH RADIOACTIVE SEED AND SENTINEL LYMPH NODE BIOPSY (Right) - PEC BLOCK as a surgical intervention.  The patient's history has been reviewed, patient examined, no change in status, stable for surgery.  I have reviewed the patient's chart and labs.  Questions were answered to the patient's satisfaction.     Autumn Messing III

## 2020-04-19 NOTE — Anesthesia Postprocedure Evaluation (Signed)
Anesthesia Post Note  Patient: Jean Morgan  Procedure(s) Performed: RIGHT BREAST LUMPECTOMY WITH RADIOACTIVE SEED AND SENTINEL LYMPH NODE BIOPSY (Right Breast)     Patient location during evaluation: PACU Anesthesia Type: General Level of consciousness: awake and alert and oriented Pain management: pain level controlled Vital Signs Assessment: post-procedure vital signs reviewed and stable Respiratory status: spontaneous breathing, nonlabored ventilation and respiratory function stable Cardiovascular status: blood pressure returned to baseline and stable Postop Assessment: no apparent nausea or vomiting Anesthetic complications: no   No complications documented.  Last Vitals:  Vitals:   04/19/20 1042 04/19/20 1045  BP: (!) 159/82   Pulse: 84 84  Resp: 14 15  Temp:  36.9 C  SpO2: 98% 97%    Last Pain:  Vitals:   04/19/20 0957  TempSrc:   PainSc: 4                  [name]

## 2020-04-19 NOTE — Op Note (Signed)
04/19/2020  10:03 AM  PATIENT:  Jean Morgan  59 y.o. female  PRE-OPERATIVE DIAGNOSIS:  RIGHT BREAST CANCER  POST-OPERATIVE DIAGNOSIS:  RIGHT BREAST CANCER  PROCEDURE:  Procedure(s) with comments: RIGHT BREAST LUMPECTOMY WITH RADIOACTIVE SEED LOCALIZATION AND DEEP RIGHT AXILLARY SENTINEL LYMPH NODE BIOPSY (Right) - PEC BLOCK  SURGEON:  Surgeon(s) and Role:    * [name], MD - Primary  PHYSICIAN ASSISTANT:   ASSISTANTS: none   ANESTHESIA:   local and general  EBL:  minimal   BLOOD ADMINISTERED:none  DRAINS: none   LOCAL MEDICATIONS USED:  MARCAINE     SPECIMEN:  Source of Specimen:  right breast tissue with sentinel nodes x 2  DISPOSITION OF SPECIMEN:  PATHOLOGY  COUNTS:  YES  TOURNIQUET:  * No tourniquets in log *  DICTATION: .Dragon Dictation   After informed consent was obtained the patient was brought to the operating room and placed in the supine position on the operating table.  After adequate induction of general anesthesia the patient's right chest, breast, and axillary area were prepped with ChloraPrep, allowed to dry, and draped in usual sterile manner.  An appropriate timeout was performed.  Previously an I-125 seed was placed in the lower portion of the right breast to mark an area of invasive breast cancer.  Earlier in the day the patient also underwent injection of 1 mCi of technetium sulfur colloid in the subareolar position on the right.  The neoprobe was initially set to technetium and an area of radioactivity was readily identified in the right axilla.  This area was infiltrated with quarter percent Marcaine.  A transversely oriented incision was made with a 15 blade knife overlying the area of radioactivity.  The incision was carried through the skin and subcutaneous tissue sharply with the electrocautery until the deep right axillary space was entered.  There was general background signals of radioactivity.  I was able to identify 2 lymph nodes that  had similar baseline levels of radioactivity and were palpable.  These nodes were excised sharply with the electrocautery and the surrounding lymphatics and small vessels were controlled with Clips.  Ex vivo counts on the first node was approximately 40.  There was no activity in the second node but it was palpable.  No other hot or palpable nodes were identified in the right axilla.  At this point the deep layer of the wound was closed with interrupted 3-0 Vicryl stitches.  The skin was closed with a running 4-0 Monocryl subcuticular stitch.  Attention was then turned to the right breast.  The neoprobe was set to I-125 in the area of radioactivity was readily identified in the lower portion of the right breast.  The area around this was infiltrated with quarter percent Marcaine.  A curvilinear incision was made along the inframammary fold with a 15 blade knife.  The incision was carried through the skin and subcutaneous tissue sharply with the electrocautery until the dissection was carried to the chest wall.  The breast tissue was then separated from the pectoralis muscle along the lower portion of the breast.  Dissection was then carried between the breast tissue and the subcutaneous fat and skin anteriorly throughout the lower portion of the breast.  I then marked the area of radioactivity with a Vicryl stitch.  A wedge of tissue around the area of radioactivity was then removed sharply with the electrocautery.  Once the specimen was removed it was oriented with the appropriate paint colors.  A specimen  radiograph was obtained that showed the clip and seed to be near the center of the specimen.  The specimen was then sent to pathology for further evaluation.  Hemostasis was achieved using the Bovie electrocautery.  The wound was irrigated with saline and infiltrated with more quarter percent Marcaine.  The breast tissue was then reapproximated with layers of interrupted 3-0 Vicryl stitches.  The skin was then  closed with a running 4-0 Monocryl subcuticular stitch.  Dermabond dressings and a breast binder were then applied.  The patient tolerated the procedure well.  At the end of the case all needle sponge and instrument counts were correct.  The patient was then awakened and taken to recovery in stable condition.  PLAN OF CARE: Discharge to home after PACU  PATIENT DISPOSITION:  PACU - hemodynamically stable.   Delay start of Pharmacological VTE agent (>24hrs) due to surgical blood loss or risk of bleeding: not applicable

## 2020-04-20 ENCOUNTER — Encounter (HOSPITAL_COMMUNITY): Payer: Self-pay | Admitting: General Surgery

## 2020-04-24 ENCOUNTER — Encounter: Payer: Self-pay | Admitting: *Deleted

## 2020-04-24 LAB — SURGICAL PATHOLOGY

## 2020-05-04 ENCOUNTER — Ambulatory Visit (INDEPENDENT_AMBULATORY_CARE_PROVIDER_SITE_OTHER): Payer: BC Managed Care – PPO | Admitting: Family Medicine

## 2020-05-04 ENCOUNTER — Encounter: Payer: Self-pay | Admitting: Family Medicine

## 2020-05-04 ENCOUNTER — Other Ambulatory Visit: Payer: Self-pay

## 2020-05-04 ENCOUNTER — Other Ambulatory Visit (HOSPITAL_COMMUNITY)
Admission: RE | Admit: 2020-05-04 | Discharge: 2020-05-04 | Disposition: A | Discharge status: Home / Self Care | Payer: BC Managed Care – PPO | Source: Ambulatory Visit | Attending: Family Medicine | Admitting: Family Medicine

## 2020-05-04 VITALS — BP 110/70 | HR 81 | Resp 16 | Ht 66.0 in | Wt 145.0 lb

## 2020-05-04 DIAGNOSIS — Z01419 Encounter for gynecological examination (general) (routine) without abnormal findings: Secondary | ICD-10-CM | POA: Diagnosis not present

## 2020-05-04 DIAGNOSIS — R0989 Other specified symptoms and signs involving the circulatory and respiratory systems: Secondary | ICD-10-CM

## 2020-05-04 DIAGNOSIS — F419 Anxiety disorder, unspecified: Secondary | ICD-10-CM

## 2020-05-04 DIAGNOSIS — Z124 Encounter for screening for malignant neoplasm of cervix: Secondary | ICD-10-CM

## 2020-05-04 DIAGNOSIS — N898 Other specified noninflammatory disorders of vagina: Secondary | ICD-10-CM | POA: Insufficient documentation

## 2020-05-04 DIAGNOSIS — A6000 Herpesviral infection of urogenital system, unspecified: Secondary | ICD-10-CM | POA: Diagnosis not present

## 2020-05-04 DIAGNOSIS — Z9889 Other specified postprocedural states: Secondary | ICD-10-CM

## 2020-05-04 MED ORDER — CITALOPRAM HYDROBROMIDE 20 MG PO TABS: 20.0000 mg | ORAL_TABLET | Freq: Every day | ORAL | 3 refills | Status: DC

## 2020-05-04 MED ORDER — VALACYCLOVIR HCL 1 G PO TABS: ORAL_TABLET | ORAL | 1 refills | Status: DC

## 2020-05-04 NOTE — Progress Notes (Signed)
HPI: JeanJean Morgan is a 59 y.o. female, who is here today for her routine gynecologic examination. She noted that she was due for Pap smear, so she claims appointment. She had a Pap smear on 04/01/2017, negative for malignancy and negative for HPV.  Regular exercise 3 or more time per week: Not consistently. Following a healthy diet: Yes. She lives with her husband.  Chronic medical problems: Enlarged thyroid, depression, anxiety, recurrent genital herpes, hyperlipidemia, and recently diagnosed with right breast cancer.  Hx of abnormal pap smears: Negative.  Sexually active. G:1 L:1  M:15 LMP: earlier 68's.  Mammogram: 01/23/20. DEXA: 01/23/20, osteopenia. Hep C screening: 03/2017 NR.  She has some concerns today. She wonders if she has a UTI. She has had vaginal pruritus for the past few days. She has not noted vaginal discharge or bleeding. Negative for dysuria, gross hematuria, increasing urine frequency, or pelvic pain. She has not used OTC treatments.  Left knee pain for the past 2 to 3 weeks. No history of trauma. Negative for erythema or edema.  Mood swings, hair loss, hot flashes since she was started on anastrozole.  S/P right lumpectomy on 04/19/20. She is concerned because still having local edema and some pain. Gradually improving.  She needs refill for Valtrex, which she uses as needed for recurrent genital herpes.  Depression and anxiety: Problem exacerbated by recent diagnosis of cancer. Currently she is on Celexa 10 mg daily, which she started about a week ago. She is also on Valium 5 mg twice daily as needed.  She has tolerated medication well. She has noted mild improvement. Negative for suicidal thoughts.  Review of Systems  Constitutional: Positive for fatigue. Negative for activity change, appetite change and fever.  HENT: Negative for mouth sores, nosebleeds and sore throat.   Eyes: Negative for redness and visual disturbance.    Respiratory: Negative for cough, shortness of breath and wheezing.   Cardiovascular: Negative for chest pain, palpitations and leg swelling.  Gastrointestinal: Negative for abdominal pain, nausea and vomiting.       Negative for changes in bowel habits.  Genitourinary: Positive for genital sores. Negative for vaginal pain.  Musculoskeletal: Negative for gait problem and myalgias.  Neurological: Negative for syncope, weakness and headaches.  Psychiatric/Behavioral: Negative for confusion and hallucinations. The patient is nervous/anxious.    Current Outpatient Medications on File Prior to Visit  Medication Sig Dispense Refill  . anastrozole (ARIMIDEX) 1 MG tablet Take 1 tablet (1 mg total) by mouth daily. 30 tablet 3  . Calcium-Vitamin D-Vitamin K [id number] MG-UNT-MCG CHEW Chew 2 each by mouth daily.    . cetirizine (ZYRTEC) 5 MG tablet Take 5 mg by mouth daily as needed for allergies.    Marland Kitchen HYDROcodone-acetaminophen (NORCO/VICODIN) 5-325 MG tablet Take 1-2 tablets by mouth every 6 (six) hours as needed for moderate pain or severe pain. 15 tablet 0   No current facility-administered medications on file prior to visit.   Past Medical History:  Diagnosis Date  . Anxiety   . Cancer ([place])    skin  . Depression   . Family history of adverse reaction to anesthesia    brother had severe agitation coming out of anesthesia, had to be re-intubated  . Family history of breast cancer   . Family history of colon cancer   . Family history of leukemia   . Family history of ovarian cancer   . Family history of prostate cancer   . Family history of  skin cancer   . Heart murmur    "slight"  . Hypothyroidism   . Optic neuritis   . 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 BREAST LUMPECTOMY WITH RADIOACTIVE SEED AND SENTINEL LYMPH NODE BIOPSY Right 04/19/2020   Procedure: RIGHT BREAST LUMPECTOMY WITH RADIOACTIVE SEED AND SENTINEL LYMPH NODE  BIOPSY;  Surgeon: [name], MD;  Location: [place];  Service: General;  Laterality: Right;  PEC BLOCK  . DENTAL SURGERY    . MELANOMA EXCISION  2006   Right forearm  . SENTINEL NODE BIOPSY Right 2006  . TONSILLECTOMY    . TONSILLECTOMY      Allergies  Allergen Reactions  . Latex Itching  . Oxycodone Other (See Comments)    Other Reaction: "too strong a drug for me"    Family History  Problem Relation Age of Onset  . Colon polyps Mother        more than 10  . Colitis Mother   . Arthritis Mother   . Hyperlipidemia Mother   . Diabetes Mother   . Skin cancer Mother   . Heart disease Father   . Hyperlipidemia Father   . Hypertension Father   . Colon cancer Paternal Aunt        dx. in her 41s  . Diabetes Maternal Grandmother   . Breast cancer Maternal Grandmother        dx. >50  . Breast cancer Other 21       maternal great-aunt  . Ovarian cancer Other 22  . Colon cancer Other 67  . Skin cancer Other 79  . Prostate cancer Half-Brother 60  . Leukemia Maternal Great-grandmother 61       chronic    Social History   Socioeconomic History  . Marital status: Married    Spouse name: Not on file  . Number of children: 1  . Years of education: Not on file  . Highest education level: Not on file  Occupational History  . Not on file  Tobacco Use  . Smoking status: Never Smoker  . Smokeless tobacco: Never Used  Vaping Use  . Vaping Use: Never used  Substance and Sexual Activity  . Alcohol use: Yes    Alcohol/week: 7.0 standard drinks    Types: 7 Glasses of wine per week  . Drug use: No  . Sexual activity: Not on file  Other Topics Concern  . Not on file  Social History Narrative  . Not on file   Social Determinants of Health   Financial Resource Strain:   . Difficulty of Paying Living Expenses:   Food Insecurity:   . Worried About Charity fundraiser in the Last Year:   . Arboriculturist in the Last Year:   Transportation Needs:   . Lexicographer (Medical):   Marland Kitchen Lack of Transportation (Non-Medical):   Physical Activity:   . Days of Exercise per Week:   . Minutes of Exercise per Session:   Stress:   . Feeling of Stress :   Social Connections:   . Frequency of Communication with Friends and Family:   . Frequency of Social Gatherings with Friends and Family:   . Attends Religious Services:   . Active Member of Clubs or Organizations:   . Attends Archivist Meetings:   Marland Kitchen Marital Status:     Vitals:   05/04/20 1129  BP: 110/70  Pulse: 81  Resp: 16  SpO2: 98%   Body mass index is 23.4 kg/m.  Wt Readings from Last 3 Encounters:  05/04/20 145 lb (65.8 kg)  04/19/20 144 lb 6.4 oz (65.5 kg)  04/13/20 144 lb 6.4 oz (65.5 kg)   Physical Exam Vitals and nursing note reviewed. Exam conducted with a chaperone present.  Constitutional:      General: She is not in acute distress.    Appearance: She is well-developed.  HENT:     Head: Normocephalic and atraumatic.     Mouth/Throat:     Mouth: Mucous membranes are moist.     Pharynx: Oropharynx is clear. Uvula midline.  Eyes:     Conjunctiva/sclera: Conjunctivae normal.     Pupils: Pupils are equal, round, and reactive to light.  Neck:     Thyroid: Thyromegaly (mild) present. No thyroid mass.     Trachea: No tracheal deviation.  Cardiovascular:     Rate and Rhythm: Normal rate and regular rhythm.     Pulses:          Posterior tibial pulses are 2+ on the left side.     Heart sounds: No murmur heard.      Comments: DP and PT left hand difficult to find. Pulmonary:     Effort: Pulmonary effort is normal. No respiratory distress.     Breath sounds: Normal breath sounds.  Chest:    Abdominal:     Palpations: Abdomen is soft. There is no hepatomegaly or mass.     Tenderness: There is no abdominal tenderness.  Genitourinary:    Labia:        Right: No rash, tenderness or lesion.        Left: No rash, tenderness or lesion.      Urethra: No  prolapse, urethral pain or urethral lesion.     Vagina: No vaginal discharge or bleeding.     Cervix: No cervical motion tenderness, discharge or friability.     Uterus: Not enlarged and not tender.      Adnexa:        Right: No mass, tenderness or fullness.         Left: No mass, tenderness or fullness.       Comments: Breast: No masses, skin abnormalities, or nipple discharge appreciated left. Right breast:Horizontal linear wound under breast and linear diagonal wound on anterior axillar area. Healing well. Ecchymosis lower quadrants.  Vaginal mucosa is thin and atrophic.  Pap smear collected.  Musculoskeletal:     Right lower leg: No edema.     Left lower leg: No edema.     Comments: No major deformity or signs of synovitis appreciated.  Lymphadenopathy:     Cervical: No cervical adenopathy.  Skin:    General: Skin is warm.     Findings: No erythema or rash.  Neurological:     General: No focal deficit present.     Mental Status: She is alert and oriented to person, place, and time.     Cranial Nerves: No cranial nerve deficit.     Gait: Gait normal.  Psychiatric:        Mood and Affect: Affect normal. Mood is anxious.        Speech: Speech normal.     Comments: Well groomed, good eye contact.    ASSESSMENT AND PLAN:  Ms. Myrtha Tonkovich was here today for routine gynecologic examination.  Orders Placed This Encounter  Procedures  . VAS [place] ABI WITH/WO  TBI    Encounter for gynecological examination without abnormal finding Pap smear done today. We discussed current recommendations in regard to female preventive care. Encouraged regular physical activity and continue following a healthful diet for disease prevention.  Recurrent genital herpes Stable. No changes in current management.  -     valACYclovir (VALTREX) 1000 MG tablet; Start ASAP after symptoms onset and within 48 hours:1 tab daily for 5 days prn.  Screening for malignant neoplasm of cervix -     PAP  [[place]]  Vaginal pruritus We discussed possible etiologies. Vaginal atrophy, dx discussed. OTC [place] may help. Because hx of breast cancer recently dx'ed, vaginal premarin is not an option.  Anxiety disorder, unspecified type Improved but not well controlled. Celexa dose increased from 10 mg to 20 mg. Continue Valium 5 mg bid as needed.  -     citalopram (CELEXA) 20 MG tablet; Take 1 tablet (20 mg total) by mouth daily.  S/P lumpectomy, right breast Wounds healing well. Instructed about warning signs. Keep f/u appt with surgeon.  Decreased pulses in feet -     VAS [place] ABI WITH/WO TBI; Future    Return in 2 months (on 07/05/2020) for anxiety and depression.   Claude Waldman G. [name], MD  [facility]. [place] office.

## 2020-05-04 NOTE — Patient Instructions (Addendum)
A few things to remember from today's visit:   Encounter for gynecological examination without abnormal finding  Recurrent genital herpes - Plan: valACYclovir (VALTREX) 1000 MG tablet  Screening for malignant neoplasm of cervix - Plan: PAP [[place]]  Vaginal pruritus - Plan: Cervicovaginal ancillary only  Anxiety disorder, unspecified type   Atrophic Vaginitis Atrophic vaginitis is a condition in which the tissues that line the vagina become dry and thin. This condition occurs in women who have stopped having their period. It is caused by a drop in a female hormone (estrogen). This hormone helps:  To keep the vagina moist.  To make a clear fluid. This clear fluid helps: ? To make the vagina ready for sex. ? To protect the vagina from infection. If the lining of the vagina is dry and thin, it may cause irritation, burning, or itchiness. It may also:  Make sex painful.  Make an exam of your vagina painful.  Cause bleeding.  Make you lose interest in sex.  Cause a burning feeling when you pee (urinate).  Cause a brown or yellow fluid to come from your vagina. Some women do not have symptoms. Follow these instructions at home: Medicines  Take over-the-counter and prescription medicines only as told by your doctor.  Do not use herbs or other medicines unless your doctor says it is okay.  Use medicines for for dryness. These include: ? Oils to make the vagina soft. ? Creams. ? Moisturizers. General instructions  Do not douche.  Do not use products that can make your vagina dry. These include: ? Scented sprays. ? Scented tampons. ? Scented soaps.  Sex can help increase blood flow and soften the tissue in the vagina. If it hurts to have sex: ? Tell your partner. ? Use products to make sex more comfortable. Use these only as told by your doctor. Contact a doctor if you:  Have discharge from the vagina that is different than usual.  Have a bad smell coming  from your vagina.  Have new symptoms.  Do not get better.  Get worse. Summary  Atrophic vaginitis is a condition in which the lining of the vagina becomes dry and thin.  This condition affects women who have stopped having their periods.  Treatment may include using products that help make the vagina soft.  Call a doctor if do not get better with treatment. This information is not intended to replace advice given to you by your health care provider. Make sure you discuss any questions you have with your health care provider. Document Revised: 10/26/2017 Document Reviewed: 10/26/2017 Elsevier Patient Education  2020 [employer].  If you need refills please call your pharmacy. Do not use My Chart to request refills or for acute issues that need immediate attention.    Please be sure medication list is accurate. If a new problem present, please set up appointment sooner than planned today.

## 2020-05-06 MED ORDER — DIAZEPAM 5 MG PO TABS: 2.5000 mg | ORAL_TABLET | Freq: Two times a day (BID) | ORAL | 1 refills | Status: DC | PRN

## 2020-05-07 ENCOUNTER — Encounter: Payer: Self-pay | Admitting: Radiation Oncology

## 2020-05-07 ENCOUNTER — Other Ambulatory Visit: Payer: Self-pay

## 2020-05-07 ENCOUNTER — Telehealth: Payer: Self-pay

## 2020-05-07 LAB — CERVICOVAGINAL ANCILLARY ONLY
Bacterial Vaginitis (gardnerella): NEGATIVE
Candida Glabrata: NEGATIVE
Candida Vaginitis: NEGATIVE
Comment: NEGATIVE
Comment: NEGATIVE
Comment: NEGATIVE

## 2020-05-07 NOTE — Telephone Encounter (Signed)
Spoke with patient and advised that per Shona Simpson PA appointment for simulation will be rescheduled due to Dr Ethlyn Gallery wanting to post pone. Advised patient that Bryson Ha will still give her a call tomorrow during her scheduled appointment time. Patient verbalized understanding. Meaningful use questions were reviewed.

## 2020-05-08 ENCOUNTER — Ambulatory Visit
Admission: RE | Admit: 2020-05-08 | Discharge: 2020-05-08 | Disposition: A | Discharge status: Home / Self Care | Payer: BC Managed Care – PPO | Source: Ambulatory Visit | Attending: Radiation Oncology | Admitting: Radiation Oncology

## 2020-05-08 ENCOUNTER — Ambulatory Visit: Payer: BC Managed Care – PPO | Admitting: Radiation Oncology

## 2020-05-08 DIAGNOSIS — C50511 Malignant neoplasm of lower-outer quadrant of right female breast: Secondary | ICD-10-CM | POA: Insufficient documentation

## 2020-05-08 DIAGNOSIS — Z170 Estrogen receptor positive status [ER+]: Secondary | ICD-10-CM | POA: Insufficient documentation

## 2020-05-08 LAB — CYTOLOGY - PAP
Chlamydia: NEGATIVE
Comment: NEGATIVE
Comment: NEGATIVE
Comment: NEGATIVE
Comment: NORMAL
Diagnosis: NEGATIVE
High risk HPV: NEGATIVE
Neisseria Gonorrhea: NEGATIVE
Trichomonas: NEGATIVE

## 2020-05-08 NOTE — Progress Notes (Signed)
Radiation Oncology         [phone] ________________________________  Outpatient Follow Up - Conducted via telephone due to current COVID-19 concerns for limiting patient exposure  I spoke with the patient to conduct this consult visit via telephone to spare the patient unnecessary potential exposure in the healthcare setting during the current COVID-19 pandemic. The patient was notified in advance and was offered a [place] meeting to allow for face to face communication but unfortunately reported that they did not have the appropriate resources/technology to support such a visit and instead preferred to proceed with a telephone visit.   Name: Jean Morgan        MRN: [id number]  Date of Service: 05/08/2020 DOB: Apr 01, 1961  TK:[id number], Malka So, MD  Truitt Merle, MD     REFERRING PHYSICIAN: Truitt Merle, MD   DIAGNOSIS: The encounter diagnosis was Malignant neoplasm of lower-outer quadrant of right breast of female, estrogen receptor positive ([place]).   HISTORY OF PRESENT ILLNESS: Jean Morgan is a 59 y.o. female originally seen in the multidisciplinary breast clinic for a new diagnosis of right breast cancer. The patient was noted to have bilateral breast pain that prompted diagnostic imaging. She had further imaging of the right breast when an abnormality was noted. By ultrasound the area was at 6:00 and measured 2.3 x 1.7 x 1.4 cm and it was abutting the pectoralis muscle. Her axilla was negative for adenopathy. 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  Her tumor was ER/PR positive, HER-2 was negative and her Ki-67 was 15%.  She began neoadjuvant antiestrogen therapy and subsequently underwent right lumpectomy on 04/19/2020 which revealed a grade 2 invasive ductal carcinoma measuring 2.2 cm with associated DCIS and calcifications.  Her margins were negative for carcinoma with the closest being  >1 mm to the posterior margin. Three sampled lymph nodes were evaluated and negative for disease.  She is contacted today to discuss the rationale for adjuvant radiotherapy.    PREVIOUS RADIATION THERAPY: No   PAST MEDICAL HISTORY:  Past Medical History:  Diagnosis Date  . Anxiety   . Cancer ([place])    skin  . Depression   . Family history of adverse reaction to anesthesia    brother had severe agitation coming out of anesthesia, had to be re-intubated  . Family history of breast cancer   . Family history of colon cancer   . Family history of leukemia   . Family history of ovarian cancer   . Family history of prostate cancer   . Family history of skin cancer   . Heart murmur    "slight"  . Hypothyroidism   . Optic neuritis   . [place] spotted fever    1970's       PAST SURGICAL HISTORY: Past Surgical History:  Procedure Laterality Date  . BREAST LUMPECTOMY WITH RADIOACTIVE SEED AND SENTINEL LYMPH NODE BIOPSY Right 04/19/2020   Procedure: RIGHT BREAST LUMPECTOMY WITH RADIOACTIVE SEED AND SENTINEL LYMPH NODE BIOPSY;  Surgeon: [name], MD;  Location: [facility];  Service: General;  Laterality: Right;  PEC BLOCK  . DENTAL SURGERY    . MELANOMA EXCISION  2006   Right forearm  . SENTINEL NODE BIOPSY Right 2006  . TONSILLECTOMY    . TONSILLECTOMY       FAMILY HISTORY:  Family History  Problem Relation Age of Onset  . Colon polyps Mother  more than 10  . Colitis Mother   . Arthritis Mother   . Hyperlipidemia Mother   . Diabetes Mother   . Skin cancer Mother   . Heart disease Father   . Hyperlipidemia Father   . Hypertension Father   . Colon cancer Paternal Aunt        dx. in her 67s  . Diabetes Maternal Grandmother   . Breast cancer Maternal Grandmother        dx. >50  . Breast cancer Other 57       maternal great-aunt  . Ovarian cancer Other 8  . Colon cancer Other 41  . Skin cancer Other 45  . Prostate cancer Half-Brother 49  . Leukemia  Maternal Great-grandmother 50       chronic     SOCIAL HISTORY:  reports that she has never smoked. She has never used smokeless tobacco. She reports current alcohol use of about 7.0 standard drinks of alcohol per week. She reports that she does not use drugs.  The patient is married and lives in [place]. 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 She has two adult children. Her son is in [place], and daughter is in [place] doing a study abroad program.  ALLERGIES: Latex and Oxycodone   MEDICATIONS:  Current Outpatient Medications  Medication Sig Dispense Refill  . anastrozole (ARIMIDEX) 1 MG tablet Take 1 tablet (1 mg total) by mouth daily. 30 tablet 3  . Calcium-Vitamin D-Vitamin K [id number] MG-UNT-MCG CHEW Chew 2 each by mouth daily.    . cetirizine (ZYRTEC) 5 MG tablet Take 5 mg by mouth daily as needed for allergies.    . citalopram (CELEXA) 20 MG tablet Take 1 tablet (20 mg total) by mouth daily. 30 tablet 3  . diazepam (VALIUM) 5 MG tablet Take 0.5-1 tablets (2.5-5 mg total) by mouth every 12 (twelve) hours as needed for anxiety. Take 1-2 tabs 15 min before procedures. 40 tablet 1  . HYDROcodone-acetaminophen (NORCO/VICODIN) 5-325 MG tablet Take 1-2 tablets by mouth every 6 (six) hours as needed for moderate pain or severe pain. 15 tablet 0  . valACYclovir (VALTREX) 1000 MG tablet Start ASAP after symptoms onset and within 48 hours:1 tab daily for 5 days prn. 60 tablet 1   No current facility-administered medications for this encounter.     REVIEW OF SYSTEMS: On review of systems, the patient reports that she is doing well overall. She has had some feelings of sadness about her diagnosis and has been taking Celexa which seems to help. She has had some numbness in her right upper extremity along her axillary site, and with swelling and bruising of her breast. She is planning to meet with PT regarding these issues. No other complaints are verbalized.    PHYSICAL  EXAM:  Unable to assess due to encounter type.   ECOG = 0  0 - Asymptomatic (Fully active, able to carry on all predisease activities without restriction)  1 - Symptomatic but completely ambulatory (Restricted in physically strenuous activity but ambulatory and able to carry out work of a light or sedentary nature. For example, light housework, office work)  2 - Symptomatic, <50% in bed during the day (Ambulatory and capable of all self care but unable to carry out any work activities. Up and about more than 50% of waking hours)  3 - Symptomatic, >50% in bed, but not bedbound (Capable of only limited self-care, confined to bed or chair 50% or more of  waking hours)  4 - Bedbound (Completely disabled. Cannot carry on any self-care. Totally confined to bed or chair)  5 - Death   Eustace Pen MM, Creech RH, Tormey DC, et al. [phone]). "Toxicity and response criteria of the [facility]". [place] Oncol. 5 (6): 649-55    LABORATORY DATA:  Lab Results  Component Value Date   WBC 8.8 04/13/2020   HGB 13.7 04/13/2020   HCT 42.3 04/13/2020   MCV 95.1 04/13/2020   PLT 325 04/13/2020   Lab Results  Component Value Date   NA 142 04/13/2020   K 3.9 04/13/2020   CL 105 04/13/2020   CO2 26 04/13/2020   Lab Results  Component Value Date   ALT 19 02/08/2020   AST 20 02/08/2020   ALKPHOS 85 02/08/2020   BILITOT 0.4 02/08/2020      RADIOGRAPHY: NM Sentinel Node Inj-No Rpt (Breast)  Result Date: 04/19/2020 Sulfur colloid was injected by the nuclear medicine technologist for melanoma sentinel node.   MM Breast Surgical Specimen  Result Date: 04/19/2020 CLINICAL DATA:  Evaluate surgical specimen following RIGHT lumpectomy for breast cancer. EXAM: SPECIMEN RADIOGRAPH OF THE RIGHT BREAST COMPARISON:  Previous exam(s). FINDINGS: Status post excision of the RIGHT breast. The radioactive seed and RIBBON biopsy marker clip are present, completely intact, and were marked for  pathology. IMPRESSION: Specimen radiograph of the RIGHT breast. Electronically Signed   By: Margarette Canada M.D.   On: 04/19/2020 09:24   MM RT RADIOACTIVE SEED LOC MAMMO GUIDE  Result Date: 04/18/2020 CLINICAL DATA:  Patient presents for radioactive seed localization prior to surgical excision of biopsy-proven malignancy over the lower mid to outer right breast marked by ribbon clip. EXAM: MAMMOGRAPHIC GUIDED RADIOACTIVE SEED LOCALIZATION OF THE RIGHT BREAST COMPARISON:  Previous exam(s). FINDINGS: Patient presents for radioactive seed localization prior to surgical excision. I met with the patient and we discussed the procedure of seed localization including benefits and alternatives. We discussed the high likelihood of a successful procedure. We discussed the risks of the procedure including infection, bleeding, tissue injury and further surgery. We discussed the low dose of radioactivity involved in the procedure. Informed, written consent was given. The usual time-out protocol was performed immediately prior to the procedure. Using mammographic guidance, sterile technique, 1% lidocaine and an I-125 radioactive seed, the targeted ribbon clip was localized using a lateral to medial approach. The follow-up mammogram images confirm the seed in the expected location and were marked for [name]. The radioactive seed lies 3 mm posteromedial to the clip on the CC image and approximately 3 mm posteroinferior to the clip on the lateral image. Note that the clip lies along the superior edge of the targeted mass on the lateral image. Follow-up survey of the patient confirms presence of the radioactive seed. Order number of I-125 seed:  [id number]. Total activity:  4.982 millicurie reference Date: 03/22/2020 The patient tolerated the procedure well and was released from the [facility]. She was given instructions regarding seed removal. IMPRESSION: Radioactive seed localization right breast. No apparent complications.  Electronically Signed   By: Marin Olp M.D.   On: 04/18/2020 15:10       IMPRESSION/PLAN: 1. Stage IB, pT2N0M0 grade 2, ER/PR positive invasive ductal carcinoma of the right breast. Dr. Lisbeth Renshaw has reviewed her case and I reviewed her pathology findings and reviews the nature of invasive right breast disease. She is still healing but will likely be ready to proceed with radiation in the next  week or two. We discussed the rationale for external radiotherapy to the breast to reduce the risks of local recurrence along with antiestrogen therapy. We discussed the risks, benefits, short, and long term effects of radiotherapy, and the patient is interested in proceeding at the appropriate time. Dr. Lisbeth Renshaw has recommended a course of 4 weeks of radiotherapy. She will come in next Thursday for simulation at which time she will sign written consent to proceed.  Given current concerns for patient exposure during the COVID-19 pandemic, this encounter was conducted via telephone.  The patient has provided two factor identification and has given verbal consent for this type of encounter and has been advised to only accept a meeting of this type in a secure network environment. The time spent during this encounter was 45 minutes including preparation, discussion, and coordination of the patient's care. The attendants for this meeting include Hayden Pedro  and [name].  During the encounterAlison Ferrel Logan was located [facility].  Saidee Geremia was located at home.      Carola Rhine, PAC

## 2020-05-09 ENCOUNTER — Other Ambulatory Visit: Payer: Self-pay

## 2020-05-09 ENCOUNTER — Encounter: Payer: Self-pay | Admitting: Physical Therapy

## 2020-05-09 ENCOUNTER — Ambulatory Visit: Payer: BC Managed Care – PPO | Attending: General Surgery | Admitting: Physical Therapy

## 2020-05-09 DIAGNOSIS — M542 Cervicalgia: Secondary | ICD-10-CM | POA: Diagnosis present

## 2020-05-09 DIAGNOSIS — R600 Localized edema: Secondary | ICD-10-CM | POA: Diagnosis not present

## 2020-05-09 DIAGNOSIS — M6281 Muscle weakness (generalized): Secondary | ICD-10-CM | POA: Insufficient documentation

## 2020-05-09 DIAGNOSIS — M25611 Stiffness of right shoulder, not elsewhere classified: Secondary | ICD-10-CM | POA: Insufficient documentation

## 2020-05-09 DIAGNOSIS — R293 Abnormal posture: Secondary | ICD-10-CM

## 2020-05-09 IMAGING — MR MR BREAST BILAT WO/W CM
6 of 11 series · 27 of 48 positions shown · IV contrast (gadavist)
Comparison: Previous exam(s).
COMPARISON: Previous exam(s).

Addendum:
CLINICAL DATA: Recent ultrasound-guided core biopsy of mass in the
6 o'clock location of the RIGHT breast shows grade 2 invasive
mammary carcinoma in the with carcinoma in situ with perineural
invasion and calcifications. E cadherin stains support ductal
phenotype.

LABS:  None obtained at the time of imaging.
EXAM:
BILATERAL BREAST MRI WITH AND WITHOUT CONTRAST
TECHNIQUE: Multiplanar, multisequence MR images of both breasts were obtained
prior to and following the intravenous administration of 7 ml of
Gadavist

[Series 2: T2 · axial · 3.0mm · 0.86mm/px · z∈[-93,+93]mm · 4 of 63 slices shown]
[im 1/63]
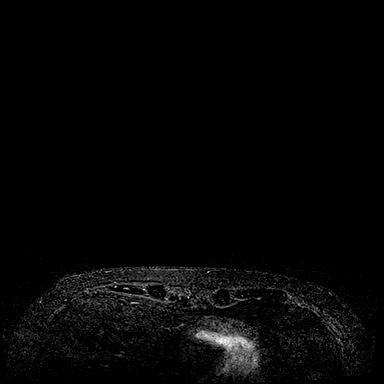
[im 21/63]
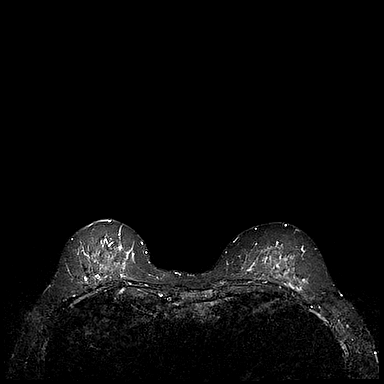
[im 42/63]
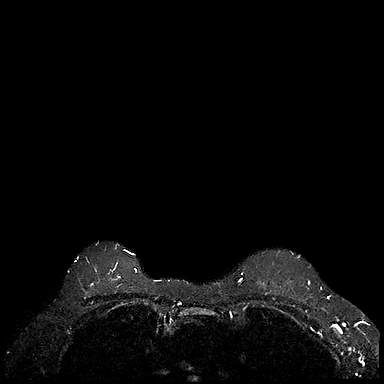
[im 63/63]
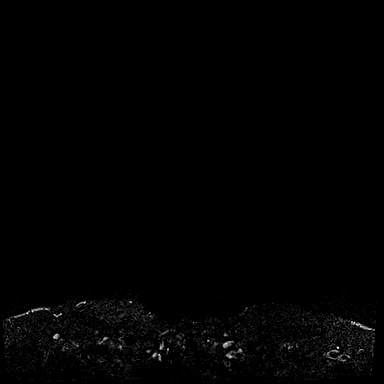

[Series 3: T1 fat-sat · axial · 1.2mm · 0.74mm/px · z∈[-95,+95]mm · 7 of 160 slices shown (1 of 3)]
[im 1/160]
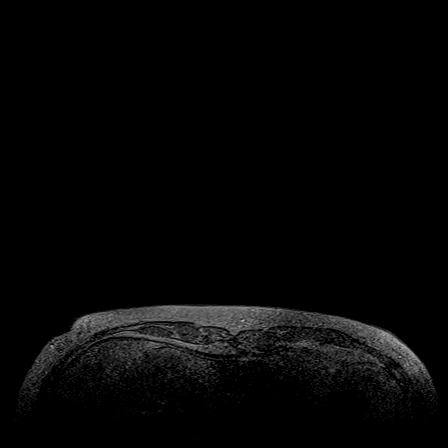
[im 27/160]
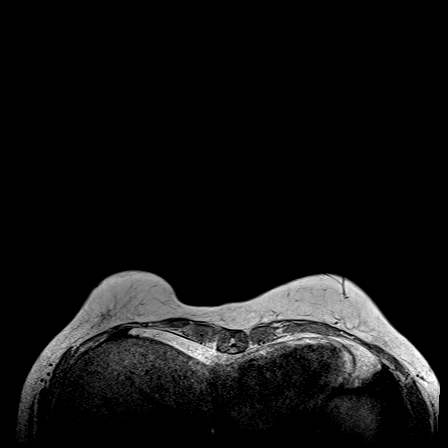
[im 54/160]
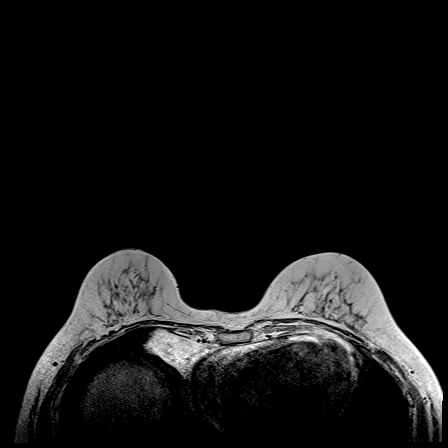
[im 80/160]
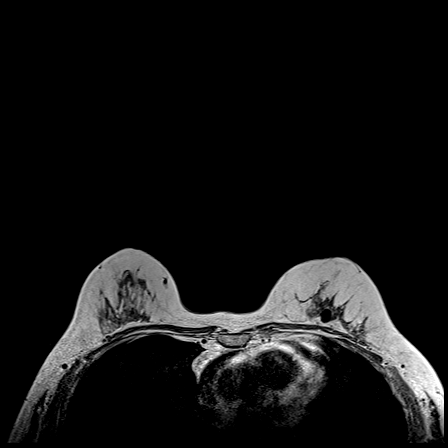
[im 107/160]
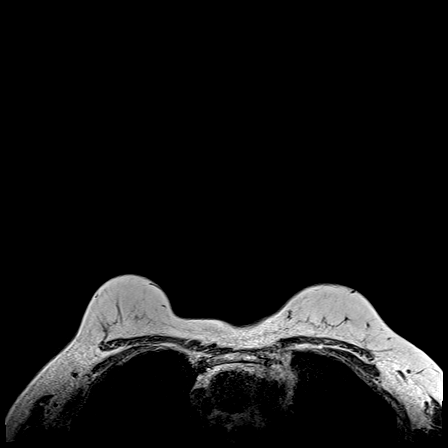
[im 133/160]
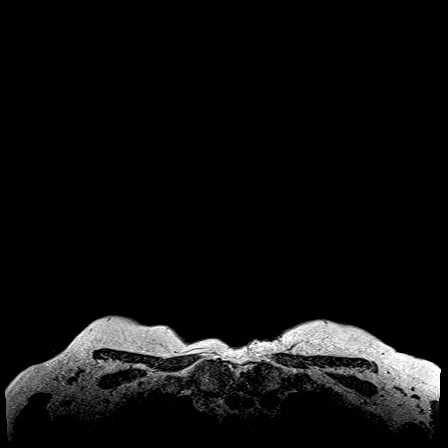
[im 160/160]
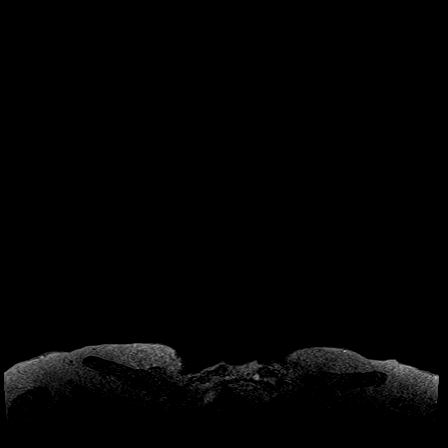

[Series 5: T1 fat-sat · axial · 1.6mm · 0.79mm/px · z∈[-105,+98]mm · 5 of 128 slices shown (2 of 3)]
[im 1/128]
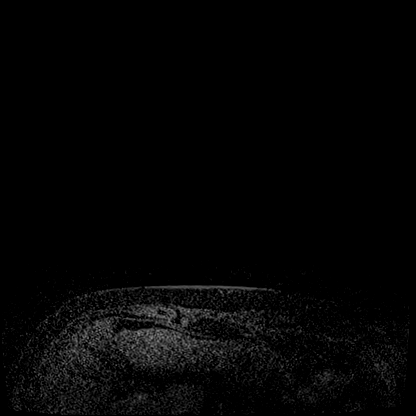
[im 32/128]
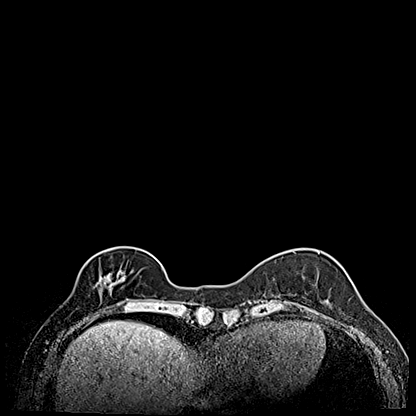
[im 64/128]
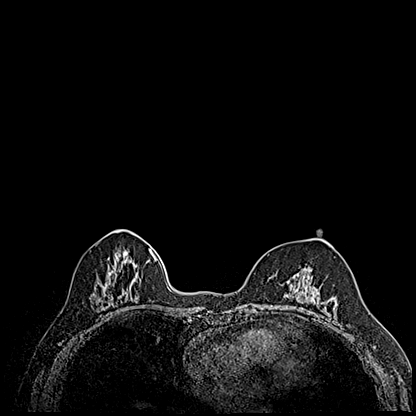
[im 96/128]
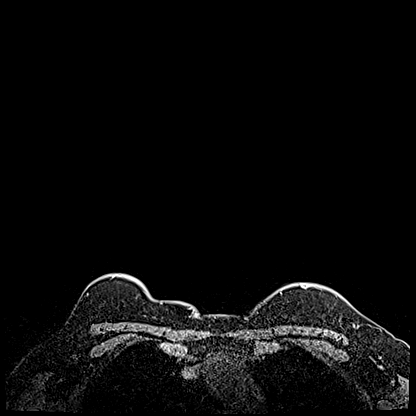
[im 128/128]
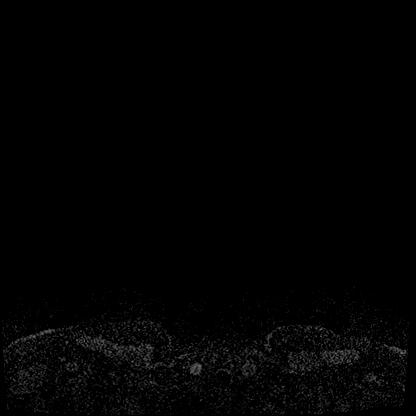

[Series 6: T1 fat-sat · axial · 1.6mm · 0.79mm/px · z∈[-105,+98]mm · 5 of 128 slices shown (3 of 3)]
[im 1/128]
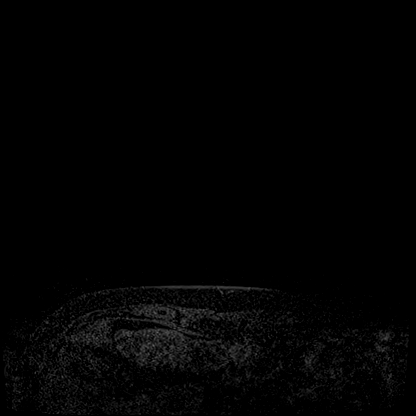
[im 32/128]
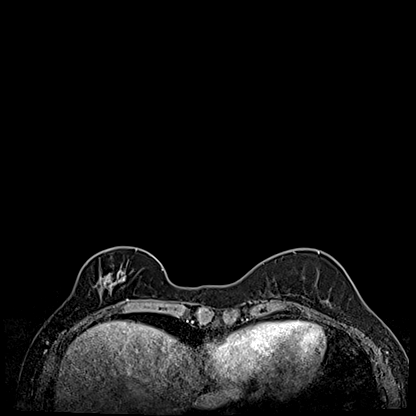
[im 64/128]
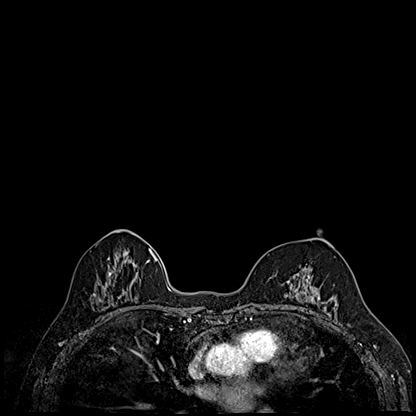
[im 96/128]
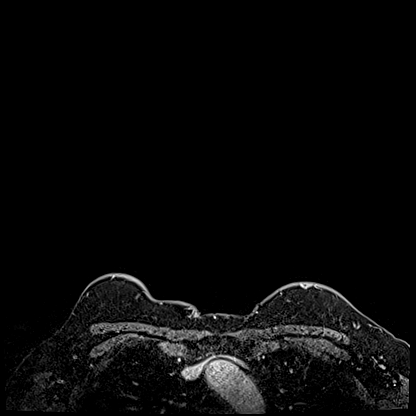
[im 128/128]
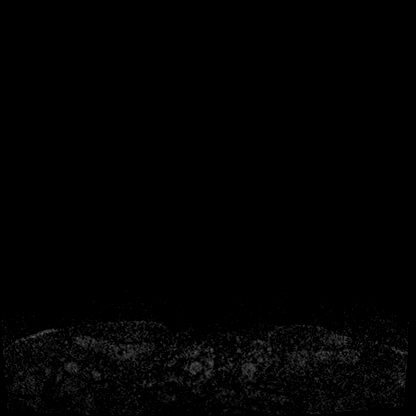

[Series 7: T1 · axial · 1.6mm · 0.79mm/px · z∈[-105,+98]mm · 5 of 128 slices shown (1 of 2)]
[im 1/128]
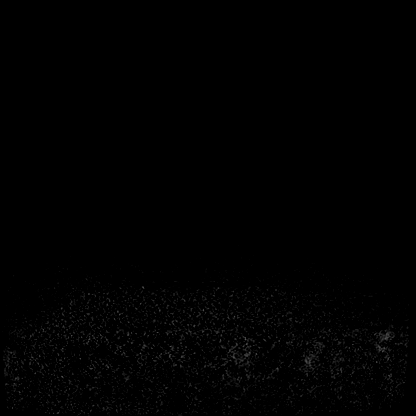
[im 32/128]
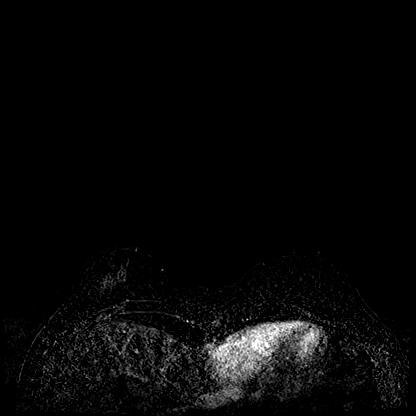
[im 64/128]
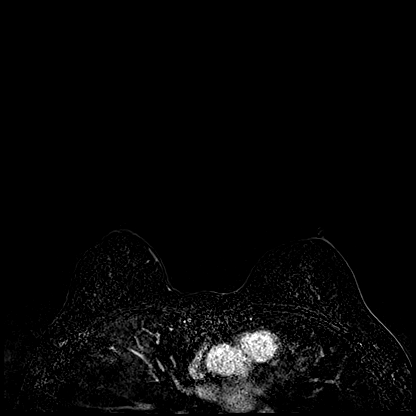
[im 96/128]
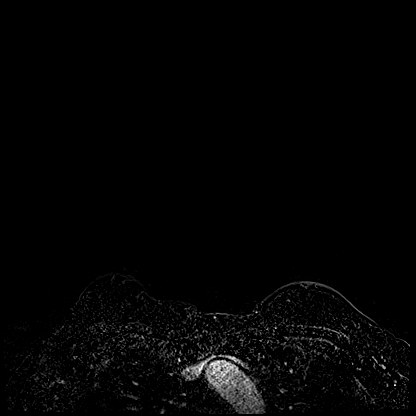
[im 128/128]
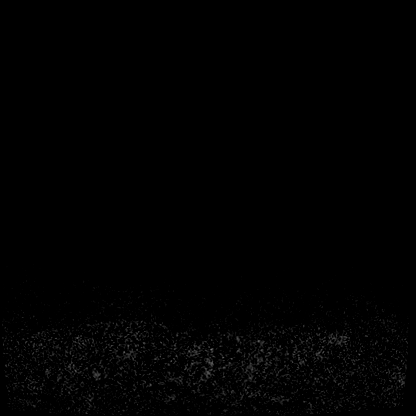

[Series 8: T1 · coronal · 330.0mm · 0.79mm/px · 1 of 3 slices shown (2 of 2)]
[im 1/3]
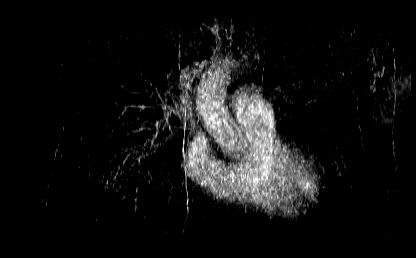

[27 of 48 positions shown; findings below may reference images not displayed]

Three-dimensional MR images were rendered by post-processing of the
original MR data on an independent workstation. The
three-dimensional MR images were interpreted, and findings are
reported in the following complete MRI report for this study. Three
dimensional images were evaluated at the independent [employer]
workstation
FINDINGS: Breast composition: c. Heterogeneous fibroglandular tissue.

Background parenchymal enhancement: Mild

Right breast: Within the LOWER central portion of the LEFT breast
there is an irregular mass with spiculated margins. Mass shows
moderate wash-in and persistent type enhancement kinetics and
measures 3.1 x 2.1 x 2.6 centimeters. Lesion is well seen on image
93 of series 13. No additional findings identified within the RIGHT
breast.

Left breast: No mass or abnormal enhancement.

Lymph nodes: No abnormal appearing lymph nodes.

Ancillary findings:  None.
IMPRESSION: Known malignancy in the LOWER central portion of the RIGHT breast
measuring 3.1 centimeters.

No adenopathy in the RIGHT axilla.

No additional areas of concern are identified in either breast.

RECOMMENDATION:
Treatment plan for known right breast malignancy.

BI-RADS CATEGORY  6: Known biopsy-proven malignancy.

ADDENDUM:
The mass in the LOWER central portion of the RIGHT breast appears
discrete from the pectoralis muscle. There is at least 6 millimeters
of normal appearing fat interposed between the posterior margin of
the mass and the anterior margin of the RIGHT pectoralis muscle.

Findings are discussed with Dr. Ramakrishna.

*** End of Addendum ***
Three-dimensional MR images were rendered by post-processing of the
original MR data on an independent workstation. The
three-dimensional MR images were interpreted, and findings are
reported in the following complete MRI report for this study. Three
dimensional images were evaluated at the independent [employer]
workstation
FINDINGS: Breast composition: c. Heterogeneous fibroglandular tissue.

Background parenchymal enhancement: Mild

Right breast: Within the LOWER central portion of the LEFT breast
there is an irregular mass with spiculated margins. Mass shows
moderate wash-in and persistent type enhancement kinetics and
measures 3.1 x 2.1 x 2.6 centimeters. Lesion is well seen on image
93 of series 13. No additional findings identified within the RIGHT
breast.

Left breast: No mass or abnormal enhancement.

Lymph nodes: No abnormal appearing lymph nodes.

Ancillary findings:  None.
IMPRESSION: Known malignancy in the LOWER central portion of the RIGHT breast
measuring 3.1 centimeters.

No adenopathy in the RIGHT axilla.

No additional areas of concern are identified in either breast.

RECOMMENDATION:
Treatment plan for known right breast malignancy.

BI-RADS CATEGORY  6: Known biopsy-proven malignancy.

## 2020-05-09 NOTE — Therapy (Signed)
[place], [place], [address] Phone: [phone]   Fax:  [phone]  Physical Therapy Treatment  Patient Details  Name: Jean Morgan MRN: [id number] Date of Birth: 10/17/1961 Referring Provider (PT): [name]   Encounter Date: 05/09/2020   PT End of Session - 05/09/20 0956    Visit Number 2    Number of Visits 11    Date for PT Re-Evaluation 06/06/20    PT Start Time 0906    PT Stop Time 0955    PT Time Calculation (min) 49 min    Activity Tolerance Patient tolerated treatment well    Behavior During Therapy [facility] for tasks assessed/performed           Past Medical History:  Diagnosis Date  . Anxiety   . Cancer ([place])    skin  . Depression   . Family history of adverse reaction to anesthesia    brother had severe agitation coming out of anesthesia, had to be re-intubated  . Family history of breast cancer   . Family history of colon cancer   . Family history of leukemia   . Family history of ovarian cancer   . Family history of prostate cancer   . Family history of skin cancer   . Heart murmur    "slight"  . Hypothyroidism   . Optic neuritis   . 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 BREAST LUMPECTOMY WITH RADIOACTIVE SEED AND SENTINEL LYMPH NODE BIOPSY Right 04/19/2020   Procedure: RIGHT BREAST LUMPECTOMY WITH RADIOACTIVE SEED AND SENTINEL LYMPH NODE BIOPSY;  Surgeon: [name], MD;  Location: [facility];  Service: General;  Laterality: Right;  PEC BLOCK  . DENTAL SURGERY    . MELANOMA EXCISION  2006   Right forearm  . SENTINEL NODE BIOPSY Right 2006  . TONSILLECTOMY    . TONSILLECTOMY      There were no vitals filed for this visit.   Subjective Assessment - 05/09/20 0907    Subjective I am still swollen and it is hard. I am having tightness in my head and neck and shoulder from holding my arm out to keep it from resting  on the area that is swollen.    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 with radiation to follow.  has started anastrozole    Patient Stated Goals get information before surgery    Currently in Pain? Yes    Pain Score 3     Pain Location Breast    Pain Orientation Lateral;Lower    Pain Descriptors / Indicators Aching;Shooting    Pain Type Surgical pain    Pain Onset 1 to 4 weeks ago    Pain Frequency Constant    Aggravating Factors  bending forward    Pain Relieving Factors ice and heat    Effect of Pain on Daily Activities sometimes hurts to walk due to the movement              [facility] PT Assessment - 05/09/20 0001      AROM   Right Shoulder Flexion 150 Degrees    Right Shoulder ABduction 158 Degrees    Right Shoulder Internal Rotation 68 Degrees    Right  Shoulder External Rotation 90 Degrees      Palpation   Palpation comment fibrosis present along right inferior breast and lateral breast, scar is healing             LYMPHEDEMA/ONCOLOGY QUESTIONNAIRE - 05/09/20 0001      Type   Cancer Type right breast cancer      Surgeries   Lumpectomy Date 04/19/20    Sentinel Lymph Node Biopsy Date 04/19/20    Number Lymph Nodes Removed 3   2 taken previously for melanoma     Date Lymphedema/Swelling Started   Date 04/19/20      Treatment   Active Chemotherapy Treatment No    Past Chemotherapy Treatment No    Active Radiation Treatment No   will begin soon   Past Radiation Treatment No    Current Hormone Treatment Yes    Drug Name anastrozole    Past Hormone Therapy No      What other symptoms do you have   Are you Having Heaviness or Tightness Yes    Are you having Pain Yes    Are you having pitting edema No    Is it Hard or Difficult finding clothes that fit No    Do you  have infections No    Is there Decreased scar mobility Yes      Lymphedema Assessments   Lymphedema Assessments Upper extremities      Right Upper Extremity Lymphedema   10 cm Proximal to Olecranon Process 26.1 cm    Olecranon Process 25 cm    10 cm Proximal to Ulnar Styloid Process 20.1 cm    Just Proximal to Ulnar Styloid Process 16 cm    Across Hand at [employer] 17.5 cm    At [place] of 2nd Digit 5.7 cm                      [facility] Adult PT Treatment/Exercise - 05/09/20 0001      Manual Therapy   Manual Therapy Manual Lymphatic Drainage (MLD);Edema management    Edema Management created 1/2 in foam square for pt to wear in bra over biopsy scar to prevent it from rubbing against bra, also cut long piece of 1/2 grey foam for pt to wear over scar and fibrosis in inferior breast (all foam was placed in TG soft), also made foam chip pack for pt to try wearing in her bra to further help reduce edema    Manual Lymphatic Drainage (MLD) in supine: short neck, 5 diaphragmatic breaths, right inguinal nodes and establishment of axillo inguinal pathway, left axillary  nodes and establishment of interaxillary pathway, R breast with focus on inferior breast in area of fibrosis moving fluid towards pathways then retracing all steps while verbally instructing pt throughout in anatomy and physiology of lymphatic system and proper technique                       PT Long Term Goals - 05/09/20 1001      PT LONG TERM GOAL #1   Title Pt will be independent in a home exercise program for continued strengthening and stretching.    Time 4    Period Weeks    Status New    Target Date 06/06/20      PT LONG TERM GOAL #2   Title Pt will report a 75% improvement in R neck and upper shoulder pain to allow improved comfort  Time 4    Period Weeks    Status New    Target Date 06/06/20      PT LONG TERM GOAL #3   Title Pt will report a 75% improvement in R inferior breast pain  and swelling to allow improved comfort and to allow pt to receive radiation    Time 4    Period Weeks    Status New    Target Date 06/06/20      PT LONG TERM GOAL #4   Title Pt will demonstrate 170 degrees of right shoulder flexion and abduction to allow her to return to prior level of function    Baseline flexion 150, abduction 158    Time 4    Period Weeks    Status New    Target Date 06/06/20                 Plan - 05/09/20 0957    Clinical Impression Statement Pt presents to PT for post op assessment. She underwent a right lumpectomy and SLNB on 04/19/20. Three nodes were biopsied and all were negative. Pt had a previous hx of melanoma with 2 lymph nodes biopsied on same side. Pt demonstrates limitations in right flexion and abduction post surgery. She reports increased popping in her right shoulder as well as neck and shoulder pain since surgery. Pt has increased fibrosis and swelling in inferior breast and lateral trunk post sugery which causes her discomfort. Created 1/2 grey foam and a foam chip pack for pt to wear in this area to help decrease swelling. Began MLD to this area today. Pt to start radiation soon but it has been delayed due to swelling. Pt would benefit from continued skilled PT services to decrease right breast sweling, increase right shoulder ROM, decrease neck and shoulder pain and instruct pt in a home exercise program for continued strengthening and stretching.    Rehab Potential Excellent    PT Frequency 2x / week    PT Duration 4 weeks    PT Treatment/Interventions ADLs/Self Care Home Management;Patient/family education;Manual techniques;Therapeutic exercise;Manual lymph drainage;Compression bandaging;Taping;Scar mobilization;Passive range of motion;Vasopneumatic Device    PT Next Visit Plan begin AAROM - pulleys, ball, give supine dowel exercises, continue MLD to R breast and instruct pt, see how chip pack and foam chip pack worked    PT [place]  post op breast    Consulted and Agree with Plan of Care Patient           Patient will benefit from skilled therapeutic intervention in order to improve the following deficits and impairments:  Postural dysfunction, Pain  Visit Diagnosis: Localized edema  Stiffness of right shoulder, not elsewhere classified  Cervicalgia  Abnormal posture  Muscle weakness (generalized)     Problem List Patient Active Problem List   Diagnosis Date Noted  . Carpal tunnel syndrome 03/09/2020  . Genetic testing 02/29/2020  . Family history of breast cancer   . Family history of ovarian cancer   . Family history of colon cancer   . Family history of prostate cancer   . Family history of skin cancer   . Family history of leukemia   . Malignant neoplasm of lower-outer quadrant of right breast of female, estrogen receptor positive ([place]) 02/02/2020  . Osteopenia 01/25/2020  . B12 deficiency 01/04/2020  . Hearing loss 08/18/2018  . Hyperlipidemia 11/28/2016  . Anxiety and depression 11/28/2016  . Chronic fatigue 11/28/2016  . Recurrent genital herpes 11/28/2016  .  Melanoma of skin, site unspecified 06/26/2013    [facility] 05/09/2020, 10:04 AM  [place], [place], [address] Phone: [phone]   Fax:  [phone]  Name: Jean Morgan MRN: [id number] Date of Birth: 30-May-1961  Manus Gunning, PT 05/09/20 10:04 AM

## 2020-05-10 ENCOUNTER — Ambulatory Visit: Payer: BC Managed Care – PPO

## 2020-05-10 DIAGNOSIS — R600 Localized edema: Secondary | ICD-10-CM | POA: Diagnosis not present

## 2020-05-10 DIAGNOSIS — M25611 Stiffness of right shoulder, not elsewhere classified: Secondary | ICD-10-CM

## 2020-05-10 DIAGNOSIS — R293 Abnormal posture: Secondary | ICD-10-CM

## 2020-05-10 DIAGNOSIS — M542 Cervicalgia: Secondary | ICD-10-CM

## 2020-05-10 NOTE — Therapy (Signed)
[place], [place], [address] Phone: [phone]   Fax:  [phone]  Physical Therapy Treatment  Patient Details  Name: Jean Morgan MRN: [id number] Date of Birth: [birth year] Referring Provider (PT): [name]   Encounter Date: 05/10/2020   PT End of Session - 05/10/20 1021    Visit Number 3    Number of Visits 11    Date for PT Re-Evaluation 06/06/20    PT Start Time 0903    PT Stop Time 1004    PT Time Calculation (min) 61 min    Activity Tolerance Patient tolerated treatment well    Behavior During Therapy [facility] for tasks assessed/performed           Past Medical History:  Diagnosis Date  . Anxiety   . Cancer ([place])    skin  . Depression   . Family history of adverse reaction to anesthesia    brother had severe agitation coming out of anesthesia, had to be re-intubated  . Family history of breast cancer   . Family history of colon cancer   . Family history of leukemia   . Family history of ovarian cancer   . Family history of prostate cancer   . Family history of skin cancer   . Heart murmur    "slight"  . Hypothyroidism   . Optic neuritis   . 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 BREAST LUMPECTOMY WITH RADIOACTIVE SEED AND SENTINEL LYMPH NODE BIOPSY Right 04/19/2020   Procedure: RIGHT BREAST LUMPECTOMY WITH RADIOACTIVE SEED AND SENTINEL LYMPH NODE BIOPSY;  Surgeon: [name], MD;  Location: [facility];  Service: General;  Laterality: Right;  PEC BLOCK  . DENTAL SURGERY    . MELANOMA EXCISION  2006   Right forearm  . SENTINEL NODE BIOPSY Right 2006  . TONSILLECTOMY    . TONSILLECTOMY      There were no vitals filed for this visit.   Subjective Assessment - 05/10/20 0907    Subjective I can feel the tightness in my Rt shoulder but no pain. The breast still feels hard but the foam she gave me last time did seem to help  some. I didn't try the chip pack yet.    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 with radiation to follow.  has started anastrozole    Patient Stated Goals get information before surgery    Currently in Pain? No/denies                             [facility] Adult PT Treatment/Exercise - 05/10/20 0001      Shoulder Exercises: Pulleys   Flexion Other (comment)   4 mins   Flexion Limitations VCs to relax shoulder and for initial demo of technique    ABduction 2 minutes    ABduction Limitations VCs to releax shoulders and for initial demo of technique      Shoulder Exercises: Therapy Ball   Flexion Both;5 reps   gentle forward lean into end of stretch   Flexion Limitations Pt returned therapist demo    ABduction Left;5 reps   same side lean into end of stretch  ABduction Limitations Pt returned therapist demo      Manual Therapy   Manual Therapy Manual Lymphatic Drainage (MLD);Passive ROM    Manual Lymphatic Drainage (MLD) in supine: short neck, 5 diaphragmatic breaths, right inguinal nodes and establishment of axillo inguinal pathway, left axillary  nodes and establishment of interaxillary pathway, R breast with focus on inferior breast in area of fibrosis moving fluid towards pathways then into Lt S/L for further work at lateral breast but very gently near incision as pt only 3 weeks post op, working fluid towards Rt axillo-inguinal anasomosis, then finished retracing steps in supine having pt begin to return demo of each step. Hand over hand technqiue required for correct stretch and pressure    Passive ROM In Supine to Rt shoulder into flexion and abduction stabilizing axillary incision so as not to pull healing tissue                  PT Education - 05/10/20 1007     Education Details Self breast MLD    Person(s) Educated Patient    Methods Explanation;Demonstration;Handout    Comprehension Verbalized understanding;Returned demonstration;Tactile cues required;Need further instruction               PT Long Term Goals - 05/09/20 1001      PT LONG TERM GOAL #1   Title Pt will be independent in a home exercise program for continued strengthening and stretching.    Time 4    Period Weeks    Status New    Target Date 06/06/20      PT LONG TERM GOAL #2   Title Pt will report a 75% improvement in R neck and upper shoulder pain to allow improved comfort    Time 4    Period Weeks    Status New    Target Date 06/06/20      PT LONG TERM GOAL #3   Title Pt will report a 75% improvement in R inferior breast pain and swelling to allow improved comfort and to allow pt to receive radiation    Time 4    Period Weeks    Status New    Target Date 06/06/20      PT LONG TERM GOAL #4   Title Pt will demonstrate 170 degrees of right shoulder flexion and abduction to allow her to return to prior level of function    Baseline flexion 150, abduction 158    Time 4    Period Weeks    Status New    Target Date 06/06/20                 Plan - 05/10/20 1022    Clinical Impression Statement Pt returns to physical therapy today for instruction of self manual lymph drainage for inferior and lateral post op edema. Pt did well with instruction of this while therapist performed then had pt return all demo. Also issued handout for this. Also began AA/ and P/ROM to Rt shoulder which pt tolerated well, though did instruct her not to have max pull at incision. She verbalized understanding. Some softening noted by pt at lateral breast by end of session.    Personal Factors and Comorbidities Comorbidity 2    Comorbidities Rt shoulder pain currently, lymph node removal previously    Stability/Clinical Decision Making Stable/Uncomplicated    Rehab Potential Excellent     PT Frequency 2x / week    PT Duration 4 weeks    PT Treatment/Interventions  ADLs/Self Care Home Management;Patient/family education;Manual techniques;Therapeutic exercise;Manual lymph drainage;Compression bandaging;Taping;Scar mobilization;Passive range of motion;Vasopneumatic Device    PT Next Visit Plan Cont AAROM - pulleys, ball, give supine dowel exercises, continue MLD to R breast and reviewing with pt and assessing her technique, see how chip pack worked as pt hadn't tried this yet    PT Home Exercise Plan post op breast; self MLD    Consulted and Agree with Plan of Care Patient           Patient will benefit from skilled therapeutic intervention in order to improve the following deficits and impairments:  Postural dysfunction, Pain  Visit Diagnosis: Localized edema  Stiffness of right shoulder, not elsewhere classified  Cervicalgia  Abnormal posture     Problem List Patient Active Problem List   Diagnosis Date Noted  . Carpal tunnel syndrome 03/09/2020  . Genetic testing 02/29/2020  . Family history of breast cancer   . Family history of ovarian cancer   . Family history of colon cancer   . Family history of prostate cancer   . Family history of skin cancer   . Family history of leukemia   . Malignant neoplasm of lower-outer quadrant of right breast of female, estrogen receptor positive ([place]) 02/02/2020  . Osteopenia 01/25/2020  . B12 deficiency 01/04/2020  . Hearing loss 08/18/2018  . Hyperlipidemia 11/28/2016  . Anxiety and depression 11/28/2016  . Chronic fatigue 11/28/2016  . Recurrent genital herpes 11/28/2016  . Melanoma of skin, site unspecified 06/26/2013    Otelia Limes, PTA 05/10/2020, 10:35 AM  [place], [place], [address] Phone: [phone]   Fax:  [phone]  Name: Jean Morgan MRN: [id number] Date of Birth: 14-Nov-1960

## 2020-05-10 NOTE — Patient Instructions (Signed)
Self manual lymph drainage:     Cancer Rehab [phone] Perform this sequence once a day.  Only give enough pressure no your skin to make the skin move. Hug yourself.  Do circles at your neck just above your collarbones.  Repeat this 10 times. Diaphragmatic - Supine   Inhale through nose making navel move out toward hands. Exhale through puckered lips, hands follow navel in. Repeat _5__ times. Rest _10__ seconds between repeats.   Axilla - One at a Time   Using full weight of flat hand and fingers at center of uninvolved armpit, make _10__ in-place circles.    LEG: Inguinal Nodes Stimulation   With small finger side of hand against hip crease on involved side, gently perform circles at the crease. Repeat __10_ times.   1) Axilla to Inguinal Nodes - Pump   On involved side, pump _4__ times from armpit along side of trunk to hip crease.  Now gently stretch skin from the involved side to the uninvolved side across the chest at the shoulder line.  Repeat that 4 times.  Draw an imaginary diagonal line from upper outer breast through the nipple area toward lower inner breast.  Direct fluid upward and inward from this line toward the pathway across your upper chest .  Do this in three rows to treat all of the upper inner breast tissue, and do each row 3-4x.      Direct fluid to treat all of lower outer breast tissue downward and outward toward pathway that is aimed at the left groin.  Finish by doing the pathways as described above going from your involved armpit to the same side groin and going across your upper chest from the involved shoulder to the uninvolved shoulder.  Repeat the steps above where you do circles in your right groin and left armpit.

## 2020-05-14 ENCOUNTER — Encounter: Payer: Self-pay | Admitting: *Deleted

## 2020-05-17 ENCOUNTER — Other Ambulatory Visit: Payer: Self-pay

## 2020-05-17 ENCOUNTER — Ambulatory Visit
Admission: RE | Admit: 2020-05-17 | Discharge: 2020-05-17 | Disposition: A | Discharge status: Home / Self Care | Payer: BC Managed Care – PPO | Source: Ambulatory Visit | Attending: Radiation Oncology | Admitting: Radiation Oncology

## 2020-05-17 ENCOUNTER — Ambulatory Visit: Payer: BC Managed Care – PPO | Admitting: Rehabilitation

## 2020-05-17 ENCOUNTER — Encounter: Payer: Self-pay | Admitting: *Deleted

## 2020-05-17 ENCOUNTER — Encounter: Payer: Self-pay | Admitting: Rehabilitation

## 2020-05-17 DIAGNOSIS — Z170 Estrogen receptor positive status [ER+]: Secondary | ICD-10-CM | POA: Diagnosis not present

## 2020-05-17 DIAGNOSIS — R600 Localized edema: Secondary | ICD-10-CM

## 2020-05-17 DIAGNOSIS — C50511 Malignant neoplasm of lower-outer quadrant of right female breast: Secondary | ICD-10-CM | POA: Diagnosis present

## 2020-05-17 DIAGNOSIS — M25611 Stiffness of right shoulder, not elsewhere classified: Secondary | ICD-10-CM

## 2020-05-17 NOTE — Therapy (Signed)
[place], [place], [address] Phone: [phone]   Fax:  [phone]  Physical Therapy Treatment  Patient Details  Name: Jean Morgan MRN: [id number] Date of Birth: January 07, 1961 Referring Provider (PT): [name]   Encounter Date: 05/17/2020   PT End of Session - 05/17/20 1552    Visit Number 4    Number of Visits 11    Date for PT Re-Evaluation 06/06/20    PT Start Time 1500    PT Stop Time 1550    PT Time Calculation (min) 50 min    Activity Tolerance Patient tolerated treatment well    Behavior During Therapy [facility] for tasks assessed/performed           Past Medical History:  Diagnosis Date  . Anxiety   . Cancer ([place])    skin  . Depression   . Family history of adverse reaction to anesthesia    brother had severe agitation coming out of anesthesia, had to be re-intubated  . Family history of breast cancer   . Family history of colon cancer   . Family history of leukemia   . Family history of ovarian cancer   . Family history of prostate cancer   . Family history of skin cancer   . Heart murmur    "slight"  . Hypothyroidism   . Optic neuritis   . 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 BREAST LUMPECTOMY WITH RADIOACTIVE SEED AND SENTINEL LYMPH NODE BIOPSY Right 04/19/2020   Procedure: RIGHT BREAST LUMPECTOMY WITH RADIOACTIVE SEED AND SENTINEL LYMPH NODE BIOPSY;  Surgeon: [name], MD;  Location: [facility];  Service: General;  Laterality: Right;  PEC BLOCK  . DENTAL SURGERY    . MELANOMA EXCISION  2006   Right forearm  . SENTINEL NODE BIOPSY Right 2006  . TONSILLECTOMY    . TONSILLECTOMY      There were no vitals filed for this visit.   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 with radiation to follow.  has started anastrozole    Patient Stated Goals get information before surgery    Currently in Pain? No/denies    Pain Score 3     Pain Location Axilla    Pain Orientation Right    Pain Descriptors / Indicators Aching;Sore    Pain Type Surgical pain    Pain Onset 1 to 4 weeks ago    Pain Frequency Constant                             [facility] Adult PT Treatment/Exercise - 05/17/20 0001      Shoulder Exercises: Pulleys   Flexion 2 minutes    ABduction 2 minutes      Shoulder Exercises: Therapy Ball   Flexion Both;5 reps      Manual Therapy   Manual Lymphatic Drainage (MLD) Reviewed all steps in supine with PT making notes on HEP sheet and using hand over hand cueing; in supine: short neck, 5 diaphragmatic breaths, right inguinal nodes and establishment of axillo inguinal pathway, bil  axillary  nodes and establishment of interaxillary pathway, R breast with focus on inferior breast in area of fibrosis moving fluid towards pathways then finished retracing steps    Passive ROM In Supine to Rt shoulder into flexion and abduction                        PT Long Term Goals - 05/17/20 1555      PT LONG TERM GOAL #1   Title Pt will be independent in a home exercise program for continued strengthening and stretching.    Status On-going      PT LONG TERM GOAL #2   Title Pt will report a 75% improvement in R neck and upper shoulder pain to allow improved comfort    Status On-going      PT LONG TERM GOAL #3   Title Pt will report a 75% improvement in R inferior breast pain and swelling to allow improved comfort and to allow pt to receive radiation    Status On-going      PT LONG TERM GOAL #4   Title Pt will demonstrate 170 degrees of right shoulder flexion and abduction to allow her to return to  prior level of function    Status On-going                 Plan - 05/17/20 1552    Clinical Impression Statement Pt with questions on wording of the handout for self MLD so focused on self MLD of the Rt breast with PT making notes and drawing pictures as necessary for improved understanding.  Pt performing all steps correctly upon leaving.  Overall ROM is doing well and with mild general edema in the inferior axilla and mild fibrosis along lumpectomy incision anteriorly which improves with MLD with should continue to improve with addition of scar mobilization.    PT Frequency 2x / week    PT Duration 4 weeks    PT Treatment/Interventions ADLs/Self Care Home Management;Patient/family education;Manual techniques;Therapeutic exercise;Manual lymph drainage;Compression bandaging;Taping;Scar mobilization;Passive range of motion;Vasopneumatic Device    PT Next Visit Plan continue MLD to R breast and reviewing with pt and assessing her technique, shoulder PROM, should be ready for scar massage education soon will be at week 5 next visit.    PT Home Exercise Plan post op breast; self MLD    Consulted and Agree with Plan of Care Patient           Patient will benefit from skilled therapeutic intervention in order to improve the following deficits and impairments:     Visit Diagnosis: Localized edema  Stiffness of right shoulder, not elsewhere classified     Problem List Patient Active Problem List   Diagnosis Date Noted  . Carpal tunnel syndrome 03/09/2020  . Genetic testing 02/29/2020  . Family history of breast cancer   . Family history of ovarian cancer   . Family history of colon cancer   . Family history of prostate cancer   . Family history of skin cancer   . Family history of leukemia   . Malignant neoplasm of lower-outer quadrant of right breast of female, estrogen receptor positive ([place]) 02/02/2020  . Osteopenia 01/25/2020  . B12 deficiency 01/04/2020  . Hearing loss  08/18/2018  . Hyperlipidemia 11/28/2016  . Anxiety and depression 11/28/2016  . Chronic fatigue 11/28/2016  . Recurrent genital herpes 11/28/2016  . Melanoma of skin, site unspecified 06/26/2013    Stark Bray 05/17/2020,  3:58 PM  [place], [place], [address] Phone: [phone]   Fax:  [phone]  Name: Ayven Glasco MRN: [id number] Date of Birth: 1960/12/19

## 2020-05-22 ENCOUNTER — Other Ambulatory Visit: Payer: Self-pay | Admitting: Hematology

## 2020-05-22 DIAGNOSIS — C50511 Malignant neoplasm of lower-outer quadrant of right female breast: Secondary | ICD-10-CM | POA: Diagnosis not present

## 2020-05-24 ENCOUNTER — Other Ambulatory Visit: Payer: Self-pay

## 2020-05-24 ENCOUNTER — Ambulatory Visit: Payer: BC Managed Care – PPO | Admitting: Physical Therapy

## 2020-05-24 ENCOUNTER — Encounter: Payer: Self-pay | Admitting: Physical Therapy

## 2020-05-24 DIAGNOSIS — R293 Abnormal posture: Secondary | ICD-10-CM

## 2020-05-24 DIAGNOSIS — R600 Localized edema: Secondary | ICD-10-CM

## 2020-05-24 DIAGNOSIS — M25611 Stiffness of right shoulder, not elsewhere classified: Secondary | ICD-10-CM

## 2020-05-24 NOTE — Therapy (Signed)
[place], [place], [address] Phone: [phone]   Fax:  [phone]  Physical Therapy Treatment  Patient Details  Name: Jean Morgan MRN: [id number] Date of Birth: Mar 23, 1961 Referring Provider (PT): [name]   Encounter Date: 05/24/2020   PT End of Session - 05/24/20 0900    Visit Number 5    Number of Visits 11    Date for PT Re-Evaluation 06/06/20    PT Start Time 0804    PT Stop Time 5573    PT Time Calculation (min) 54 min    Activity Tolerance Patient tolerated treatment well    Behavior During Therapy [facility] for tasks assessed/performed           Past Medical History:  Diagnosis Date   Anxiety    Cancer ([place])    skin   Depression    Family history of adverse reaction to anesthesia    brother had severe agitation coming out of anesthesia, had to be re-intubated   Family history of breast cancer    Family history of colon cancer    Family history of leukemia    Family history of ovarian cancer    Family history of prostate cancer    Family history of skin cancer    Heart murmur    "slight"   Hypothyroidism    Optic neuritis    Rocky [place] spotted fever    1970's    Past Surgical History:  Procedure Laterality Date   BREAST LUMPECTOMY WITH RADIOACTIVE SEED AND SENTINEL LYMPH NODE BIOPSY Right 04/19/2020   Procedure: RIGHT BREAST LUMPECTOMY WITH RADIOACTIVE SEED AND SENTINEL LYMPH NODE BIOPSY;  Surgeon: [name], MD;  Location: [facility];  Service: General;  Laterality: Right;  [place]  2006   Right forearm   SENTINEL NODE BIOPSY Right 2006   TONSILLECTOMY     TONSILLECTOMY      There were no vitals filed for this visit.   Subjective Assessment - 05/24/20 0805    Subjective I put on a dress yesterday and it hurt my arm to take it off. It pulled underneath the scar.    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 with radiation to follow.  has started anastrozole    Patient Stated Goals get information before surgery    Currently in Pain? Yes    Pain Score 2     Pain Location Breast    Pain Orientation Right    Pain Descriptors / Indicators --   twinge                            [facility] Adult PT Treatment/Exercise - 05/24/20 0001      Shoulder Exercises: Supine   Horizontal ABduction Strengthening;Both;10 reps;Theraband   pt returned therapist demo   Theraband Level (Shoulder Horizontal ABduction) Level 1 (Yellow)    External Rotation Strengthening;Both;10 reps;Theraband   pt returned therapist demo   Theraband Level (Shoulder External Rotation) Level 1 (Yellow)    Flexion Strengthening;Both;10 reps;Theraband   pt returned therapist demo, narrow and wide grip   Theraband Level (Shoulder Flexion) Level 1 (Yellow)    Diagonals Strengthening;Both;10 reps;Theraband   pt returned  therapist demo   Theraband Level (Shoulder Diagonals) Level 1 (Yellow)      Shoulder Exercises: Pulleys   Flexion 2 minutes    ABduction 2 minutes      Shoulder Exercises: Therapy Ball   Flexion 10 reps;Right   stretch at end range   ABduction Right;10 reps   with stretch at end range     Manual Therapy   Manual Therapy Myofascial release;Soft tissue mobilization;Passive ROM    Soft tissue mobilization to area of fibrosis/scar tissue in inferior breast    Myofascial Release gently along lumpectomy scar    Passive ROM In Supine to Rt shoulder into flexion and abduction                        PT Long Term Goals - 05/17/20 1555      PT LONG TERM GOAL #1   Title Pt will be independent in a home exercise program for continued strengthening and stretching.    Status On-going      PT LONG TERM  GOAL #2   Title Pt will report a 75% improvement in R neck and upper shoulder pain to allow improved comfort    Status On-going      PT LONG TERM GOAL #3   Title Pt will report a 75% improvement in R inferior breast pain and swelling to allow improved comfort and to allow pt to receive radiation    Status On-going      PT LONG TERM GOAL #4   Title Pt will demonstrate 170 degrees of right shoulder flexion and abduction to allow her to return to prior level of function    Status On-going                 Plan - 05/24/20 0901    Clinical Impression Statement Instructed pt in supine scpaular strengthening exercises today and issued yellow theraband. Pt did feel stretching across chest with these. Educated pt in scar massage and began myofascial release and soft tissue mobilization to areas of scar tissue superior and inferior and along lumpectomy scar. At end of session pt ask about possible swelling in right lateral/posterior axilla and trunk and therapist noted swelling in this area and encouraged pt to continue to wear compression bra.    PT Frequency 2x / week    PT Duration 4 weeks    PT Treatment/Interventions ADLs/Self Care Home Management;Patient/family education;Manual techniques;Therapeutic exercise;Manual lymph drainage;Compression bandaging;Taping;Scar mobilization;Passive range of motion;Vasopneumatic Device    PT Next Visit Plan continue MLD to R breast, lateral trunk and axilla and posterior axilla, and reviewing with pt and assessing her technique, shoulder PROM, continue scar massage    PT Home Exercise Plan post op breast; self MLD, supine scap    Consulted and Agree with Plan of Care Patient           Patient will benefit from skilled therapeutic intervention in order to improve the following deficits and impairments:     Visit Diagnosis: Stiffness of right shoulder, not elsewhere classified  Abnormal posture  Localized edema     Problem List Patient  Active Problem List   Diagnosis Date Noted   Carpal tunnel syndrome 03/09/2020   Genetic testing 02/29/2020   Family history of breast cancer    Family history of ovarian cancer    Family history of colon cancer    Family history of prostate cancer    Family history of skin cancer  Family history of leukemia    Malignant neoplasm of lower-outer quadrant of right breast of female, estrogen receptor positive ([place]) 02/02/2020   Osteopenia 01/25/2020   B12 deficiency 01/04/2020   Hearing loss 08/18/2018   Hyperlipidemia 11/28/2016   Anxiety and depression 11/28/2016   Chronic fatigue 11/28/2016   Recurrent genital herpes 11/28/2016   Melanoma of skin, site unspecified 06/26/2013    Allyson Sabal [facility] 05/24/2020, 9:04 AM  [place], [place], [address] Phone: [phone]   Fax:  [phone]  Name: Jean Morgan MRN: [id number] Date of Birth: Jan 07, 1961  Manus Gunning, PT 05/24/20 9:04 AM

## 2020-05-24 NOTE — Patient Instructions (Signed)
Over Head Pull: Narrow and Wide Grip   Cancer Rehab [phone]   On back, knees bent, feet flat, band across thighs, elbows straight but relaxed. Pull hands apart (start). Keeping elbows straight, bring arms up and over head, hands toward floor. Keep pull steady on band. Hold momentarily. Return slowly, keeping pull steady, back to start. Then do same with a wider grip on the band (past shoulder width) Repeat _10__ times. Band color __yellow____   Side Pull: Double Arm   On back, knees bent, feet flat. Arms perpendicular to body, shoulder level, elbows straight but relaxed. Pull arms out to sides, elbows straight. Resistance band comes across collarbones, hands toward floor. Hold momentarily. Slowly return to starting position. Repeat _10__ times. Band color _yellow____   Sword   On back, knees bent, feet flat, left hand on left hip, right hand above left. Pull right arm DIAGONALLY (hip to shoulder) across chest. Bring right arm along head toward floor. Hold momentarily. Slowly return to starting position. Thumb is pointed down when by hit and rotates to upwards when by head.  Repeat _10__ times. Do with left arm. Band color _yellow_____   Shoulder Rotation: Double Arm   On back, knees bent, feet flat, elbows tucked at sides, bent 90, hands palms up. Pull hands apart and down toward floor, keeping elbows near sides. Hold momentarily. Slowly return to starting position. Repeat _10__ times. Band color __yellow____

## 2020-05-25 ENCOUNTER — Other Ambulatory Visit: Payer: Self-pay | Admitting: Hematology

## 2020-05-28 ENCOUNTER — Other Ambulatory Visit: Payer: Self-pay

## 2020-05-28 ENCOUNTER — Ambulatory Visit
Admission: RE | Admit: 2020-05-28 | Discharge: 2020-05-28 | Disposition: A | Discharge status: Home / Self Care | Payer: BC Managed Care – PPO | Source: Ambulatory Visit | Attending: Radiation Oncology | Admitting: Radiation Oncology

## 2020-05-28 ENCOUNTER — Ambulatory Visit: Payer: BC Managed Care – PPO | Attending: General Surgery

## 2020-05-28 DIAGNOSIS — R600 Localized edema: Secondary | ICD-10-CM | POA: Insufficient documentation

## 2020-05-28 DIAGNOSIS — M25611 Stiffness of right shoulder, not elsewhere classified: Secondary | ICD-10-CM | POA: Diagnosis present

## 2020-05-28 DIAGNOSIS — Z510 Encounter for antineoplastic radiation therapy: Secondary | ICD-10-CM | POA: Diagnosis not present

## 2020-05-28 DIAGNOSIS — G8929 Other chronic pain: Secondary | ICD-10-CM | POA: Insufficient documentation

## 2020-05-28 DIAGNOSIS — R293 Abnormal posture: Secondary | ICD-10-CM | POA: Diagnosis present

## 2020-05-28 DIAGNOSIS — C50511 Malignant neoplasm of lower-outer quadrant of right female breast: Secondary | ICD-10-CM | POA: Diagnosis present

## 2020-05-28 DIAGNOSIS — M542 Cervicalgia: Secondary | ICD-10-CM | POA: Diagnosis present

## 2020-05-28 DIAGNOSIS — M25511 Pain in right shoulder: Secondary | ICD-10-CM | POA: Diagnosis present

## 2020-05-28 DIAGNOSIS — M6281 Muscle weakness (generalized): Secondary | ICD-10-CM | POA: Diagnosis present

## 2020-05-28 DIAGNOSIS — Z170 Estrogen receptor positive status [ER+]: Secondary | ICD-10-CM | POA: Diagnosis present

## 2020-05-28 NOTE — Therapy (Signed)
[place], [place], [address] Phone: [phone]   Fax:  [phone]  Physical Therapy Treatment  Patient Details  Name: Jean Morgan MRN: [id number] Date of Birth: July 22, 1961 Referring Provider (PT): [name]   Encounter Date: 05/28/2020   PT End of Session - 05/28/20 0909    Visit Number 6    Number of Visits 11    Date for PT Re-Evaluation 06/06/20    PT Start Time 0805    PT Stop Time 0908    PT Time Calculation (min) 63 min    Activity Tolerance Patient tolerated treatment well    Behavior During Therapy [facility] for tasks assessed/performed           Past Medical History:  Diagnosis Date  . Anxiety   . Cancer ([place])    skin  . Depression   . Family history of adverse reaction to anesthesia    brother had severe agitation coming out of anesthesia, had to be re-intubated  . Family history of breast cancer   . Family history of colon cancer   . Family history of leukemia   . Family history of ovarian cancer   . Family history of prostate cancer   . Family history of skin cancer   . Heart murmur    "slight"  . Hypothyroidism   . Optic neuritis   . 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 BREAST LUMPECTOMY WITH RADIOACTIVE SEED AND SENTINEL LYMPH NODE BIOPSY Right 04/19/2020   Procedure: RIGHT BREAST LUMPECTOMY WITH RADIOACTIVE SEED AND SENTINEL LYMPH NODE BIOPSY;  Surgeon: [name], MD;  Location: [facility];  Service: General;  Laterality: Right;  PEC BLOCK  . DENTAL SURGERY    . MELANOMA EXCISION  2006   Right forearm  . SENTINEL NODE BIOPSY Right 2006  . TONSILLECTOMY    . TONSILLECTOMY      There were no vitals filed for this visit.   Subjective Assessment - 05/28/20 0809    Subjective I am still having trouble with pulling my shirt over my head. It hurts in the inside of my upper arm to my axilla, also at the medial inner  aspect near my incision.    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 with radiation to follow.  has started anastrozole    Patient Stated Goals get information before surgery    Currently in Pain? No/denies                             [facility] Adult PT Treatment/Exercise - 05/28/20 0001      Exercises   Exercises Other Exercises    Other Exercises  Spent first few minutes answering pts questions about correct UE positioning of supine scapular series. She was able to demonstrate correct position of each except with D2 wasn't fully opening at end of motion.       Manual Therapy   Myofascial Release gently along lumpectomy scar    Manual Lymphatic Drainage (MLD) In supine: short neck, 5 diaphragmatic breaths, right inguinal nodes and establishment of axillo inguinal pathway, Lt axillary  nodes and establishment of interaxillary pathway, R breast with focus on  inferior breast in area of fibrosis moving fluid towards pathways then finished retracing steps; then into Lt S/L for further work at lateral breast and trunk where more palpable firmness noted, had pt feel same and encouraged her to perform some of her self MLD in S/L so as be able to better feel fullness; redirected this towards Rt axillo-inguinal and added posterior inter-axillary anastomosis, also instructing pt she could instruct her husband in same, especially if she starts having skin issues from radiation; finished retracing steps in supine redirecting fluid to pathways and nodes, focusing on fibrotic techniques towards end briefly at inner medial breast where pt reports most tenderness, instructing her in this also. Very good softening noted by end of session here.    Passive ROM In Supine to Rt shoulder into flexion and abduction                         PT Long Term Goals - 05/17/20 1555      PT LONG TERM GOAL #1   Title Pt will be independent in a home exercise program for continued strengthening and stretching.    Status On-going      PT LONG TERM GOAL #2   Title Pt will report a 75% improvement in R neck and upper shoulder pain to allow improved comfort    Status On-going      PT LONG TERM GOAL #3   Title Pt will report a 75% improvement in R inferior breast pain and swelling to allow improved comfort and to allow pt to receive radiation    Status On-going      PT LONG TERM GOAL #4   Title Pt will demonstrate 170 degrees of right shoulder flexion and abduction to allow her to return to prior level of function    Status On-going                 Plan - 05/28/20 0909    Clinical Impression Statement Continued with manual lymph drainage to Rt breast reviewing technique verbally with pt throughout. Also instructed her today in fibrotic techniques with slightly firmer pressure to lower inner breast, then to always follow that up with retracing pathways. She has been wearing her velcro compression, reporting this helpful. First radiation treatment today so encouraged her to continue wearing compression througout the day to counteract edema from radiation. Educated her within PTA's scope of practice what skin and tissue changes to possibly expext over next 4 weeks fo radiation, especially as she reports may be gettig boost at end of radiation.  And how it will be important, due to these potential changes, to be consistent with self MLD 1-2x daily and focus on end ROM stretching unless she starts noticing skin pulling, and then to ease off stretching. Pt verbalized understanding all.    Personal Factors and Comorbidities Comorbidity 2    Comorbidities Rt shoulder pain currently, lymph node removal previously    Stability/Clinical Decision Making Stable/Uncomplicated    Rehab Potential Excellent    PT  Frequency 2x / week    PT Duration 4 weeks    PT Treatment/Interventions ADLs/Self Care Home Management;Patient/family education;Manual techniques;Therapeutic exercise;Manual lymph drainage;Compression bandaging;Taping;Scar mobilization;Passive range of motion;Vasopneumatic Device    PT Next Visit Plan continue MLD to R breast, lateral trunk and axilla and posterior axilla, and reviewing with pt and assessing her technique, shoulder PROM, continue scar massage    PT Home Exercise Plan post op breast; self MLD,  supine scapular series    Consulted and Agree with Plan of Care Patient           Patient will benefit from skilled therapeutic intervention in order to improve the following deficits and impairments:  Postural dysfunction, Pain  Visit Diagnosis: Localized edema  Stiffness of right shoulder, not elsewhere classified  Cervicalgia  Abnormal posture     Problem List Patient Active Problem List   Diagnosis Date Noted  . Carpal tunnel syndrome 03/09/2020  . Genetic testing 02/29/2020  . Family history of breast cancer   . Family history of ovarian cancer   . Family history of colon cancer   . Family history of prostate cancer   . Family history of skin cancer   . Family history of leukemia   . Malignant neoplasm of lower-outer quadrant of right breast of female, estrogen receptor positive ([place]) 02/02/2020  . Osteopenia 01/25/2020  . B12 deficiency 01/04/2020  . Hearing loss 08/18/2018  . Hyperlipidemia 11/28/2016  . Anxiety and depression 11/28/2016  . Chronic fatigue 11/28/2016  . Recurrent genital herpes 11/28/2016  . Melanoma of skin, site unspecified 06/26/2013    Otelia Limes, PTA 05/28/2020, 10:34 AM  [place], [place], [address] Phone: [phone]   Fax:  [phone]  Name: Jean Morgan MRN: [id number] Date of Birth: 1961/03/19

## 2020-05-29 ENCOUNTER — Other Ambulatory Visit: Payer: Self-pay

## 2020-05-29 ENCOUNTER — Ambulatory Visit
Admission: RE | Admit: 2020-05-29 | Discharge: 2020-05-29 | Disposition: A | Discharge status: Home / Self Care | Payer: BC Managed Care – PPO | Source: Ambulatory Visit | Attending: Radiation Oncology | Admitting: Radiation Oncology

## 2020-05-29 DIAGNOSIS — Z510 Encounter for antineoplastic radiation therapy: Secondary | ICD-10-CM | POA: Diagnosis not present

## 2020-05-29 DIAGNOSIS — F419 Anxiety disorder, unspecified: Secondary | ICD-10-CM

## 2020-05-29 MED ORDER — CITALOPRAM HYDROBROMIDE 20 MG PO TABS: 20.0000 mg | ORAL_TABLET | Freq: Every day | ORAL | 1 refills | Status: DC

## 2020-05-30 ENCOUNTER — Encounter: Payer: Self-pay | Admitting: Physical Therapy

## 2020-05-30 ENCOUNTER — Ambulatory Visit: Payer: BC Managed Care – PPO | Admitting: Physical Therapy

## 2020-05-30 ENCOUNTER — Other Ambulatory Visit: Payer: Self-pay

## 2020-05-30 ENCOUNTER — Ambulatory Visit
Admission: RE | Admit: 2020-05-30 | Discharge: 2020-05-30 | Disposition: A | Discharge status: Home / Self Care | Payer: BC Managed Care – PPO | Source: Ambulatory Visit | Attending: Radiation Oncology | Admitting: Radiation Oncology

## 2020-05-30 DIAGNOSIS — R600 Localized edema: Secondary | ICD-10-CM | POA: Diagnosis not present

## 2020-05-30 DIAGNOSIS — Z510 Encounter for antineoplastic radiation therapy: Secondary | ICD-10-CM | POA: Diagnosis not present

## 2020-05-30 DIAGNOSIS — M25611 Stiffness of right shoulder, not elsewhere classified: Secondary | ICD-10-CM

## 2020-05-30 NOTE — Therapy (Signed)
[place], [place], [address] Phone: [phone]   Fax:  [phone]  Physical Therapy Treatment  Patient Details  Name: Jean Morgan MRN: [id number] Date of Birth: 10-02-1961 Referring Provider (PT): [name]   Encounter Date: 05/30/2020   PT End of Session - 05/30/20 0959    Visit Number 7    Number of Visits 11    Date for PT Re-Evaluation 06/06/20    PT Start Time 0902    PT Stop Time 0954    PT Time Calculation (min) 52 min    Activity Tolerance Patient tolerated treatment well    Behavior During Therapy [facility] for tasks assessed/performed           Past Medical History:  Diagnosis Date  . Anxiety   . Cancer ([place])    skin  . Depression   . Family history of adverse reaction to anesthesia    brother had severe agitation coming out of anesthesia, had to be re-intubated  . Family history of breast cancer   . Family history of colon cancer   . Family history of leukemia   . Family history of ovarian cancer   . Family history of prostate cancer   . Family history of skin cancer   . Heart murmur    "slight"  . Hypothyroidism   . Optic neuritis   . 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 BREAST LUMPECTOMY WITH RADIOACTIVE SEED AND SENTINEL LYMPH NODE BIOPSY Right 04/19/2020   Procedure: RIGHT BREAST LUMPECTOMY WITH RADIOACTIVE SEED AND SENTINEL LYMPH NODE BIOPSY;  Surgeon: [name], MD;  Location: [facility];  Service: General;  Laterality: Right;  PEC BLOCK  . DENTAL SURGERY    . MELANOMA EXCISION  2006   Right forearm  . SENTINEL NODE BIOPSY Right 2006  . TONSILLECTOMY    . TONSILLECTOMY      There were no vitals filed for this visit.   Subjective Assessment - 05/30/20 0903    Subjective It was really helpful when she massaged on my back on Monday. It feels really full right now.    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 with radiation to follow.  has started anastrozole    Patient Stated Goals get information before surgery    Currently in Pain? Yes    Pain Score 3     Pain Location Breast    Pain Orientation Mid    Pain Descriptors / Indicators Tender    Pain Type Acute pain    Pain Onset 1 to 4 weeks ago    Pain Frequency Intermittent                             [facility] Adult PT Treatment/Exercise - 05/30/20 0001      Exercises   Other Exercises  instructed pt in lying supine over foam roll or tightly rolled towel to help decrease tightness and pain across medial chest- pt return demosntrated for 2 min over foam roll      Manual Therapy   Myofascial Release gently along lumpectomy scar, and area of fibrosis superior to scar    Manual Lymphatic Drainage (  MLD) In supine: short neck, 5 diaphragmatic breaths, right inguinal nodes and establishment of axillo inguinal pathway, Lt axillary  nodes and establishment of interaxillary pathway, R breast with focus on inferior breast in area of fibrosis moving fluid towards pathways then into Lt S/L for further work at lateral breast and trunk  redirected this towards Rt axillo-inguinal and added posterior inter-axillary anastomosis, then finished retracing steps in supine redirecting fluid to pathways and nodes    Passive ROM In Supine to Rt shoulder into flexion and abduction                        PT Long Term Goals - 05/17/20 1555      PT LONG TERM GOAL #1   Title Pt will be independent in a home exercise program for continued strengthening and stretching.    Status On-going      PT LONG TERM GOAL #2   Title Pt will report a 75% improvement in R neck and upper shoulder pain to allow improved comfort    Status On-going      PT LONG  TERM GOAL #3   Title Pt will report a 75% improvement in R inferior breast pain and swelling to allow improved comfort and to allow pt to receive radiation    Status On-going      PT LONG TERM GOAL #4   Title Pt will demonstrate 170 degrees of right shoulder flexion and abduction to allow her to return to prior level of function    Status On-going                 Plan - 05/30/20 0959    Clinical Impression Statement Continued with manual lymphatic drainge to right breast and lateral trunk. Continued with manual therapy to fibrotic tissue in inferior breast which softened with treatment today. Answered pt's questions regarding order of exercise and MLD. Educated pt that it would be most beneficial to exercise prior to manual lymphatic drainage. Also educated pt about lying over foam roll for pec stretch.    PT Frequency 2x / week    PT Duration 4 weeks    PT Treatment/Interventions ADLs/Self Care Home Management;Patient/family education;Manual techniques;Therapeutic exercise;Manual lymph drainage;Compression bandaging;Taping;Scar mobilization;Passive range of motion;Vasopneumatic Device    PT Next Visit Plan continue MLD to R breast, lateral trunk and axilla and posterior axilla, and reviewing with pt and assessing her technique, shoulder PROM, continue scar massage    PT Home Exercise Plan post op breast; self MLD, supine scapular series, lying over foam roll    Consulted and Agree with Plan of Care Patient           Patient will benefit from skilled therapeutic intervention in order to improve the following deficits and impairments:  Postural dysfunction, Pain  Visit Diagnosis: Localized edema  Stiffness of right shoulder, not elsewhere classified     Problem List Patient Active Problem List   Diagnosis Date Noted  . Carpal tunnel syndrome 03/09/2020  . Genetic testing 02/29/2020  . Family history of breast cancer   . Family history of ovarian cancer   . Family history  of colon cancer   . Family history of prostate cancer   . Family history of skin cancer   . Family history of leukemia   . Malignant neoplasm of lower-outer quadrant of right breast of female, estrogen receptor positive ([place]) 02/02/2020  . Osteopenia 01/25/2020  . B12 deficiency 01/04/2020  . Hearing loss  08/18/2018  . Hyperlipidemia 11/28/2016  . Anxiety and depression 11/28/2016  . Chronic fatigue 11/28/2016  . Recurrent genital herpes 11/28/2016  . Melanoma of skin, site unspecified 06/26/2013    Allyson Sabal [facility] 05/30/2020, 10:02 AM  [address], [place], [address] Phone: [phone]   Fax:  [phone]  Name: Jean Morgan MRN: [id number] Date of Birth: 06-23-1961  Manus Gunning, PT 05/30/20 10:03 AM

## 2020-05-30 NOTE — Progress Notes (Signed)
error 

## 2020-05-31 ENCOUNTER — Other Ambulatory Visit: Payer: Self-pay

## 2020-05-31 ENCOUNTER — Ambulatory Visit
Admission: RE | Admit: 2020-05-31 | Discharge: 2020-05-31 | Disposition: A | Discharge status: Home / Self Care | Payer: BC Managed Care – PPO | Source: Ambulatory Visit | Attending: Radiation Oncology | Admitting: Radiation Oncology

## 2020-05-31 ENCOUNTER — Encounter: Payer: Self-pay | Admitting: *Deleted

## 2020-05-31 DIAGNOSIS — Z510 Encounter for antineoplastic radiation therapy: Secondary | ICD-10-CM | POA: Diagnosis not present

## 2020-06-01 ENCOUNTER — Other Ambulatory Visit: Payer: Self-pay

## 2020-06-01 ENCOUNTER — Telehealth: Payer: Self-pay | Admitting: Hematology

## 2020-06-01 ENCOUNTER — Ambulatory Visit
Admission: RE | Admit: 2020-06-01 | Discharge: 2020-06-01 | Disposition: A | Discharge status: Home / Self Care | Payer: BC Managed Care – PPO | Source: Ambulatory Visit | Attending: Radiation Oncology | Admitting: Radiation Oncology

## 2020-06-01 DIAGNOSIS — Z510 Encounter for antineoplastic radiation therapy: Secondary | ICD-10-CM | POA: Diagnosis not present

## 2020-06-01 DIAGNOSIS — C50511 Malignant neoplasm of lower-outer quadrant of right female breast: Secondary | ICD-10-CM

## 2020-06-01 MED ORDER — SONAFINE EX EMUL
1.0000 "application " | Freq: Once | CUTANEOUS | Status: AC
  Administered 2020-06-01: 1 via TOPICAL

## 2020-06-01 MED ORDER — ALRA NON-METALLIC DEODORANT (RAD-ONC)
1.0000 "application " | Freq: Once | TOPICAL | Status: AC
  Administered 2020-06-01: 1 via TOPICAL

## 2020-06-01 NOTE — Progress Notes (Signed)
Pt here for patient teaching.  Pt given Radiation and You booklet, skin care instructions, Alra deodorant and Sonafine.  Reviewed areas of pertinence such as fatigue, hair loss, skin changes, breast tenderness and breast swelling . Pt able to give teach back of to pat skin and use unscented/gentle soap,apply Sonafine bid, avoid applying anything to skin within 4 hours of treatment, avoid wearing an under wire bra and to use an electric razor if they must shave. Pt verbalizes understanding of information given and will contact nursing with any questions or concerns.     Kalana Yust M. Abrar Koone RN, BSN             

## 2020-06-01 NOTE — Telephone Encounter (Signed)
Scheduled appt per 8/5 sch msg - pt is aware of appt date and time

## 2020-06-04 ENCOUNTER — Ambulatory Visit
Admission: RE | Admit: 2020-06-04 | Discharge: 2020-06-04 | Disposition: A | Discharge status: Home / Self Care | Payer: BC Managed Care – PPO | Source: Ambulatory Visit | Attending: Radiation Oncology | Admitting: Radiation Oncology

## 2020-06-04 ENCOUNTER — Ambulatory Visit: Payer: BC Managed Care – PPO | Admitting: Physical Therapy

## 2020-06-04 ENCOUNTER — Encounter: Payer: Self-pay | Admitting: Physical Therapy

## 2020-06-04 ENCOUNTER — Encounter: Payer: Self-pay | Admitting: *Deleted

## 2020-06-04 ENCOUNTER — Other Ambulatory Visit: Payer: Self-pay

## 2020-06-04 DIAGNOSIS — R600 Localized edema: Secondary | ICD-10-CM | POA: Diagnosis not present

## 2020-06-04 DIAGNOSIS — Z510 Encounter for antineoplastic radiation therapy: Secondary | ICD-10-CM | POA: Diagnosis not present

## 2020-06-04 DIAGNOSIS — R293 Abnormal posture: Secondary | ICD-10-CM

## 2020-06-04 DIAGNOSIS — M25611 Stiffness of right shoulder, not elsewhere classified: Secondary | ICD-10-CM

## 2020-06-04 DIAGNOSIS — M6281 Muscle weakness (generalized): Secondary | ICD-10-CM

## 2020-06-04 NOTE — Therapy (Signed)
[place], [place], [address] Phone: [phone]   Fax:  [phone]  Physical Therapy Treatment  Patient Details  Name: Jean Morgan MRN: [id number] Date of Birth: October 29, 1960 Referring Provider (PT): [name]   Encounter Date: 06/04/2020   PT End of Session - 06/04/20 0956    Visit Number 8    Number of Visits 11    Date for PT Re-Evaluation 06/06/20    PT Start Time 3710    PT Stop Time 0955    PT Time Calculation (min) 60 min    Activity Tolerance Patient tolerated treatment well    Behavior During Therapy [facility] for tasks assessed/performed           Past Medical History:  Diagnosis Date  . Anxiety   . Cancer ([place])    skin  . Depression   . Family history of adverse reaction to anesthesia    brother had severe agitation coming out of anesthesia, had to be re-intubated  . Family history of breast cancer   . Family history of colon cancer   . Family history of leukemia   . Family history of ovarian cancer   . Family history of prostate cancer   . Family history of skin cancer   . Heart murmur    "slight"  . Hypothyroidism   . Optic neuritis   . 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 BREAST LUMPECTOMY WITH RADIOACTIVE SEED AND SENTINEL LYMPH NODE BIOPSY Right 04/19/2020   Procedure: RIGHT BREAST LUMPECTOMY WITH RADIOACTIVE SEED AND SENTINEL LYMPH NODE BIOPSY;  Surgeon: [name], MD;  Location: [facility];  Service: General;  Laterality: Right;  PEC BLOCK  . DENTAL SURGERY    . MELANOMA EXCISION  2006   Right forearm  . SENTINEL NODE BIOPSY Right 2006  . TONSILLECTOMY    . TONSILLECTOMY      There were no vitals filed for this visit.   Subjective Assessment - 06/04/20 0857    Subjective I still have the pain the middle of my chest but I only feel it when I am rubbing across. Pt reports that she did not lay over the foam  roll at home.    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 with radiation to follow.  has started anastrozole    Patient Stated Goals get information before surgery    Currently in Pain? Yes    Pain Score 2     Pain Location Breast    Pain Descriptors / Indicators Burning    Pain Type Acute pain    Pain Onset Today    Pain Frequency Constant                             [facility] Adult PT Treatment/Exercise - 06/04/20 0001      Shoulder Exercises: Supine   Other Supine Exercises had pt demonstrate supine scap series today since she has been having increased pain, pt required cueing to perform ER correctly with elbows tucked at sides and also to rotate thumb when doing D2 but all others were correct      Manual Therapy   Soft tissue mobilization  across right pec- pt had several tender areas that felt like small peas- educated pt to ask her dr about this    Myofascial Release gently across right axilla    Manual Lymphatic Drainage (MLD) In supine: short neck, 5 diaphragmatic breaths, right inguinal nodes and establishment of axillo inguinal pathway, Lt axillary  nodes and establishment of interaxillary pathway, R breast with focus on inferior breast in area of fibrosis moving fluid towards pathways then into Lt S/L for further work at lateral breast and trunk  redirected this towards Rt axillo-inguinal and added posterior inter-axillary anastomosis, then finished retracing steps in supine redirecting fluid to pathways and nodes- focused MLD on in area of tightness and tenderness just superior to lumpectomy scar and this area of softened greatly and pt reported decreased discomfort    Passive ROM In Supine to Rt shoulder into flexion and abduction                        PT  Long Term Goals - 05/17/20 1555      PT LONG TERM GOAL #1   Title Pt will be independent in a home exercise program for continued strengthening and stretching.    Status On-going      PT LONG TERM GOAL #2   Title Pt will report a 75% improvement in R neck and upper shoulder pain to allow improved comfort    Status On-going      PT LONG TERM GOAL #3   Title Pt will report a 75% improvement in R inferior breast pain and swelling to allow improved comfort and to allow pt to receive radiation    Status On-going      PT LONG TERM GOAL #4   Title Pt will demonstrate 170 degrees of right shoulder flexion and abduction to allow her to return to prior level of function    Status On-going                 Plan - 06/04/20 0957    Clinical Impression Statement Pt is still having some discomfort at medial chest when she touches it. She brought in her theraband and return demonstrated all supine scap exercises to ensure she was doing them correctly. She only required min verbal cues to do correctly. Added soft tissue mobilization to right pec where pt is having increased tenderness. Numerous small pea like nodules palpated and pt reported tenderness with these. Educated pt to ask her doctor about this when she sees him Friday. Continued with MLD with emphasis on area of fibrosis just superior to scar and this area softened significantly. Pt felt less discomfort in this area at end of session.    Rehab Potential Excellent    PT Frequency 2x / week    PT Duration 4 weeks    PT Treatment/Interventions ADLs/Self Care Home Management;Patient/family education;Manual techniques;Therapeutic exercise;Manual lymph drainage;Compression bandaging;Taping;Scar mobilization;Passive range of motion;Vasopneumatic Device    PT Next Visit Plan see what dr said about pea like nodules, continue MLD to R breast, lateral trunk and axilla and posterior axilla, and reviewing with pt and assessing her technique, shoulder  PROM, continue scar massage    PT Home Exercise Plan post op breast; self MLD, supine scapular series, lying over foam roll    Consulted and Agree with Plan of Care Patient           Patient will benefit from skilled therapeutic intervention in order to improve the following deficits  and impairments:     Visit Diagnosis: Localized edema  Stiffness of right shoulder, not elsewhere classified  Abnormal posture  Muscle weakness (generalized)     Problem List Patient Active Problem List   Diagnosis Date Noted  . Carpal tunnel syndrome 03/09/2020  . Genetic testing 02/29/2020  . Family history of breast cancer   . Family history of ovarian cancer   . Family history of colon cancer   . Family history of prostate cancer   . Family history of skin cancer   . Family history of leukemia   . Malignant neoplasm of lower-outer quadrant of right breast of female, estrogen receptor positive ([place]) 02/02/2020  . Osteopenia 01/25/2020  . B12 deficiency 01/04/2020  . Hearing loss 08/18/2018  . Hyperlipidemia 11/28/2016  . Anxiety and depression 11/28/2016  . Chronic fatigue 11/28/2016  . Recurrent genital herpes 11/28/2016  . Melanoma of skin, site unspecified 06/26/2013    Allyson Sabal [facility] 06/04/2020, 10:01 AM  [place], [place], [address] Phone: [phone]   Fax:  [phone]  Name: Derry Arbogast MRN: [id number] Date of Birth: [birth year]  Manus Gunning, PT 06/04/20 10:02 AM

## 2020-06-05 ENCOUNTER — Other Ambulatory Visit: Payer: Self-pay

## 2020-06-05 ENCOUNTER — Ambulatory Visit
Admission: RE | Admit: 2020-06-05 | Discharge: 2020-06-05 | Disposition: A | Discharge status: Home / Self Care | Payer: BC Managed Care – PPO | Source: Ambulatory Visit | Attending: Radiation Oncology | Admitting: Radiation Oncology

## 2020-06-05 DIAGNOSIS — Z510 Encounter for antineoplastic radiation therapy: Secondary | ICD-10-CM | POA: Diagnosis not present

## 2020-06-06 ENCOUNTER — Ambulatory Visit (HOSPITAL_COMMUNITY)
Admission: RE | Admit: 2020-06-06 | Discharge: 2020-06-06 | Disposition: A | Discharge status: Home / Self Care | Payer: BC Managed Care – PPO | Source: Ambulatory Visit | Attending: Family Medicine | Admitting: Family Medicine

## 2020-06-06 ENCOUNTER — Other Ambulatory Visit: Payer: Self-pay

## 2020-06-06 ENCOUNTER — Ambulatory Visit
Admission: RE | Admit: 2020-06-06 | Discharge: 2020-06-06 | Disposition: A | Discharge status: Home / Self Care | Payer: BC Managed Care – PPO | Source: Ambulatory Visit | Attending: Radiation Oncology | Admitting: Radiation Oncology

## 2020-06-06 ENCOUNTER — Ambulatory Visit: Payer: BC Managed Care – PPO | Admitting: Physical Therapy

## 2020-06-06 ENCOUNTER — Encounter: Payer: Self-pay | Admitting: Physical Therapy

## 2020-06-06 DIAGNOSIS — Z510 Encounter for antineoplastic radiation therapy: Secondary | ICD-10-CM | POA: Diagnosis not present

## 2020-06-06 DIAGNOSIS — R293 Abnormal posture: Secondary | ICD-10-CM

## 2020-06-06 DIAGNOSIS — M6281 Muscle weakness (generalized): Secondary | ICD-10-CM

## 2020-06-06 DIAGNOSIS — M542 Cervicalgia: Secondary | ICD-10-CM

## 2020-06-06 DIAGNOSIS — R0989 Other specified symptoms and signs involving the circulatory and respiratory systems: Secondary | ICD-10-CM | POA: Diagnosis not present

## 2020-06-06 DIAGNOSIS — G8929 Other chronic pain: Secondary | ICD-10-CM

## 2020-06-06 DIAGNOSIS — R600 Localized edema: Secondary | ICD-10-CM | POA: Diagnosis not present

## 2020-06-06 DIAGNOSIS — M25611 Stiffness of right shoulder, not elsewhere classified: Secondary | ICD-10-CM

## 2020-06-06 NOTE — Addendum Note (Signed)
Addended by: Wynelle Beckmann, Hilaria Ota L on: 06/06/2020 11:11 AM   Modules accepted: Orders

## 2020-06-06 NOTE — Therapy (Signed)
[place], [place], [address] Phone: [phone]   Fax:  [phone]  Physical Therapy Treatment  Patient Details  Name: Jean Morgan MRN: [id number] Date of Birth: 1961/04/02 Referring Provider (PT): [name]   Encounter Date: 06/06/2020   PT End of Session - 06/06/20 0958    Visit Number 9    Number of Visits 11    Date for PT Re-Evaluation 06/06/20    PT Start Time 0910    PT Stop Time 0957    PT Time Calculation (min) 47 min    Activity Tolerance Patient tolerated treatment well    Behavior During Therapy [facility] for tasks assessed/performed           Past Medical History:  Diagnosis Date  . Anxiety   . Cancer ([place])    skin  . Depression   . Family history of adverse reaction to anesthesia    brother had severe agitation coming out of anesthesia, had to be re-intubated  . Family history of breast cancer   . Family history of colon cancer   . Family history of leukemia   . Family history of ovarian cancer   . Family history of prostate cancer   . Family history of skin cancer   . Heart murmur    "slight"  . Hypothyroidism   . Optic neuritis   . 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 BREAST LUMPECTOMY WITH RADIOACTIVE SEED AND SENTINEL LYMPH NODE BIOPSY Right 04/19/2020   Procedure: RIGHT BREAST LUMPECTOMY WITH RADIOACTIVE SEED AND SENTINEL LYMPH NODE BIOPSY;  Surgeon: [name], MD;  Location: [facility];  Service: General;  Laterality: Right;  PEC BLOCK  . DENTAL SURGERY    . MELANOMA EXCISION  2006   Right forearm  . SENTINEL NODE BIOPSY Right 2006  . TONSILLECTOMY    . TONSILLECTOMY      There were no vitals filed for this visit.   Subjective Assessment - 06/06/20 0912    Subjective I see the radiation doctor on Friday. The techs did not seem concerned about the spots on my chest.    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 with radiation to follow.  has started anastrozole    Patient Stated Goals get information before surgery    Currently in Pain? Yes    Pain Score 2     Pain Location Axilla    Pain Orientation Right                             [facility] Adult PT Treatment/Exercise - 06/06/20 0001      Manual Therapy   Manual Therapy Myofascial release;Soft tissue mobilization;Passive ROM    Soft tissue mobilization gently to R pec using coconut oil    Myofascial Release gently across right axilla    Manual Lymphatic Drainage (MLD) In supine: short neck, superficial and deep abdominals, right inguinal nodes and establishment of axillo inguinal pathway, Lt axillary  nodes and establishment of interaxillary pathway, R breast with focus on inferior breast in area of fibrosis moving fluid towards pathways finished retracing steps in supine redirecting fluid to pathways and nodes- focused MLD  on in area of tightness and tenderness just superior to lumpectomy scar and this area of softened greatly and pt reported decreased discomfort    Passive ROM In Supine to Rt shoulder into flexion and abduction                        PT Long Term Goals - 05/17/20 1555      PT LONG TERM GOAL #1   Title Pt will be independent in a home exercise program for continued strengthening and stretching.    Status On-going      PT LONG TERM GOAL #2   Title Pt will report a 75% improvement in R neck and upper shoulder pain to allow improved comfort    Status On-going      PT LONG TERM GOAL #3   Title Pt will report a 75% improvement in R inferior breast pain and swelling to allow improved comfort and to allow pt to receive radiation    Status On-going      PT LONG TERM GOAL #4   Title Pt will  demonstrate 170 degrees of right shoulder flexion and abduction to allow her to return to prior level of function    Status On-going                 Plan - 06/06/20 0959    Clinical Impression Statement Continued with MLD to inferior breast to help reduce fibrosis and reduce discomfort. Pt has not been seen by doctor yet regarding the small pea like nodules across right chest. These areas were still palpable today and tender during soft tissue mobilization of pec. Myofascial release performed to right axilla and pt felt stretching with this especially in right lateral trunk. Pt feels that manual therapy is helping reduce discomfort.    PT Frequency 2x / week    PT Duration 4 weeks    PT Treatment/Interventions ADLs/Self Care Home Management;Patient/family education;Manual techniques;Therapeutic exercise;Manual lymph drainage;Compression bandaging;Taping;Scar mobilization;Passive range of motion;Vasopneumatic Device    PT Next Visit Plan see what dr said about pea like nodules, continue MLD to R breast, lateral trunk and axilla and posterior axilla, and reviewing with pt and assessing her technique, shoulder PROM, continue scar massage    PT Home Exercise Plan post op breast; self MLD, supine scapular series, lying over foam roll    Consulted and Agree with Plan of Care Patient           Patient will benefit from skilled therapeutic intervention in order to improve the following deficits and impairments:     Visit Diagnosis: Localized edema  Stiffness of right shoulder, not elsewhere classified  Abnormal posture     Problem List Patient Active Problem List   Diagnosis Date Noted  . Carpal tunnel syndrome 03/09/2020  . Genetic testing 02/29/2020  . Family history of breast cancer   . Family history of ovarian cancer   . Family history of colon cancer   . Family history of prostate cancer   . Family history of skin cancer   . Family history of leukemia   . Malignant  neoplasm of lower-outer quadrant of right breast of female, estrogen receptor positive ([place]) 02/02/2020  . Osteopenia 01/25/2020  . B12 deficiency 01/04/2020  . Hearing loss 08/18/2018  . Hyperlipidemia 11/28/2016  . Anxiety and depression 11/28/2016  . Chronic fatigue 11/28/2016  . Recurrent genital herpes 11/28/2016  . Melanoma of skin, site unspecified 06/26/2013    [facility]  [facility] 06/06/2020, 10:01 AM  [place], [place], [address] Phone: [phone]   Fax:  [phone]  Name: Hetal Proano MRN: [id number] Date of Birth: October 17, 1961  Manus Gunning, PT 06/06/20 10:01 AM

## 2020-06-07 ENCOUNTER — Ambulatory Visit
Admission: RE | Admit: 2020-06-07 | Discharge: 2020-06-07 | Disposition: A | Discharge status: Home / Self Care | Payer: BC Managed Care – PPO | Source: Ambulatory Visit | Attending: Radiation Oncology | Admitting: Radiation Oncology

## 2020-06-07 ENCOUNTER — Other Ambulatory Visit: Payer: Self-pay

## 2020-06-07 DIAGNOSIS — Z510 Encounter for antineoplastic radiation therapy: Secondary | ICD-10-CM | POA: Diagnosis not present

## 2020-06-08 ENCOUNTER — Ambulatory Visit
Admission: RE | Admit: 2020-06-08 | Discharge: 2020-06-08 | Disposition: A | Discharge status: Home / Self Care | Payer: BC Managed Care – PPO | Source: Ambulatory Visit | Attending: Radiation Oncology | Admitting: Radiation Oncology

## 2020-06-08 ENCOUNTER — Other Ambulatory Visit: Payer: Self-pay

## 2020-06-08 DIAGNOSIS — Z510 Encounter for antineoplastic radiation therapy: Secondary | ICD-10-CM | POA: Diagnosis not present

## 2020-06-11 ENCOUNTER — Other Ambulatory Visit: Payer: Self-pay

## 2020-06-11 ENCOUNTER — Ambulatory Visit
Admission: RE | Admit: 2020-06-11 | Discharge: 2020-06-11 | Disposition: A | Discharge status: Home / Self Care | Payer: BC Managed Care – PPO | Source: Ambulatory Visit | Attending: Radiation Oncology | Admitting: Radiation Oncology

## 2020-06-11 DIAGNOSIS — Z510 Encounter for antineoplastic radiation therapy: Secondary | ICD-10-CM | POA: Diagnosis not present

## 2020-06-12 ENCOUNTER — Ambulatory Visit
Admission: RE | Admit: 2020-06-12 | Discharge: 2020-06-12 | Disposition: A | Discharge status: Home / Self Care | Payer: BC Managed Care – PPO | Source: Ambulatory Visit | Attending: Radiation Oncology | Admitting: Radiation Oncology

## 2020-06-12 ENCOUNTER — Other Ambulatory Visit: Payer: Self-pay

## 2020-06-12 DIAGNOSIS — Z510 Encounter for antineoplastic radiation therapy: Secondary | ICD-10-CM | POA: Diagnosis not present

## 2020-06-13 ENCOUNTER — Other Ambulatory Visit: Payer: Self-pay

## 2020-06-13 ENCOUNTER — Ambulatory Visit
Admission: RE | Admit: 2020-06-13 | Discharge: 2020-06-13 | Disposition: A | Discharge status: Home / Self Care | Payer: BC Managed Care – PPO | Source: Ambulatory Visit | Attending: Radiation Oncology | Admitting: Radiation Oncology

## 2020-06-13 DIAGNOSIS — Z510 Encounter for antineoplastic radiation therapy: Secondary | ICD-10-CM | POA: Diagnosis not present

## 2020-06-14 ENCOUNTER — Ambulatory Visit
Admission: RE | Admit: 2020-06-14 | Discharge: 2020-06-14 | Disposition: A | Discharge status: Home / Self Care | Payer: BC Managed Care – PPO | Source: Ambulatory Visit | Attending: Radiation Oncology | Admitting: Radiation Oncology

## 2020-06-14 ENCOUNTER — Ambulatory Visit: Payer: BC Managed Care – PPO | Admitting: Physical Therapy

## 2020-06-14 ENCOUNTER — Other Ambulatory Visit: Payer: Self-pay

## 2020-06-14 ENCOUNTER — Encounter: Payer: Self-pay | Admitting: Physical Therapy

## 2020-06-14 DIAGNOSIS — R600 Localized edema: Secondary | ICD-10-CM | POA: Diagnosis not present

## 2020-06-14 DIAGNOSIS — M6281 Muscle weakness (generalized): Secondary | ICD-10-CM

## 2020-06-14 DIAGNOSIS — Z510 Encounter for antineoplastic radiation therapy: Secondary | ICD-10-CM | POA: Diagnosis not present

## 2020-06-14 DIAGNOSIS — M25611 Stiffness of right shoulder, not elsewhere classified: Secondary | ICD-10-CM

## 2020-06-14 DIAGNOSIS — R293 Abnormal posture: Secondary | ICD-10-CM

## 2020-06-14 NOTE — Patient Instructions (Signed)
First of all, check with your insurance company to see if provider is in network    [facility] (for wigs and compression sleeves / gloves/gauntlets )  [address] [place], [place] [address] [phone]  Will file some insurances --- call for appointment   [facility] (for mastectomy prosthetics and garments) [address] [place], [place] [address] [phone] Will file some insurances --- call for appointment  [facility]  [address]  , [place] [address] [phone] Lower extremity garments  [facility] [address] [place], [place]  [address] [phone]  Cathy Rubel ( Medicaid certified lymphedema fitter) [phone] [email or web address]  Melissa Meares  [facility]  [phone]  [facility] [address] [place], [place] [address] [phone]  Other Resources: National Lymphedema Network:  www.lymphnet.org www.Klosetraining.com for patient articles and self manual lymph drainage information www.lymphedemablog.com has informative articles.  www.compressionguru.com [email or web address] [email or web address] [email or web address] 

## 2020-06-14 NOTE — Therapy (Signed)
[facility] [address] [place], [place], [address] Phone: [phone]   Fax:  [phone]  Physical Therapy Treatment  Patient Details  Name: Jean Morgan MRN: [id number] Date of Birth: 11/10/1960 Referring Provider (PT): Dr. Toth    Progress Note Reporting Period 03/28/2020 to 06/14/2020  See note below for Objective Data and Assessment of Progress/Goals.        Encounter Date: 06/14/2020   PT End of Session - 06/14/20 1614    Visit Number 10    Number of Visits 19    Date for PT Re-Evaluation 07/04/20    PT Start Time 1500    PT Stop Time 1550    PT Time Calculation (min) 50 min    Activity Tolerance Patient tolerated treatment well    Behavior During Therapy WFL for tasks assessed/performed           Past Medical History:  Diagnosis Date  . Anxiety   . Cancer (HCC)    skin  . Depression   . Family history of adverse reaction to anesthesia    brother had severe agitation coming out of anesthesia, had to be re-intubated  . Family history of breast cancer   . Family history of colon cancer   . Family history of leukemia   . Family history of ovarian cancer   . Family history of prostate cancer   . Family history of skin cancer   . Heart murmur    "slight"  . Hypothyroidism   . Optic neuritis   . 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 BREAST LUMPECTOMY WITH RADIOACTIVE SEED AND SENTINEL LYMPH NODE BIOPSY Right 04/19/2020   Procedure: RIGHT BREAST LUMPECTOMY WITH RADIOACTIVE SEED AND SENTINEL LYMPH NODE BIOPSY;  Surgeon: Toth, Paul III, MD;  Location: [facility] OR;  Service: General;  Laterality: Right;  PEC BLOCK  . DENTAL SURGERY    . MELANOMA EXCISION  2006   Right forearm  . SENTINEL NODE BIOPSY Right 2006  . TONSILLECTOMY    . TONSILLECTOMY      There were no vitals filed for this visit.   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 Toth with radiation to follow.  has started anastrozole    Patient Stated Goals to get rid of the tightness in her breast    Currently in Pain? Yes    Pain Score --   pt did not rate but complained aboaut tenderness at nipple area                            [facility] Adult PT Treatment/Exercise - 06/14/20 0001      Manual Therapy   Manual Therapy Edema management    Edema Management gave pt script to have signed by Dr. Feng for prophylactic compression sleeve and gauntlet and gave pt information about how to get in touch with [facility] Also gave information about Alps breast petals that may help with pain at nipple     Manual Lymphatic Drainage (MLD) In supine:   short neck, superficial and deep abdominals, right inguinal nodes and establishment of axillo inguinal pathway, Lt axillary  nodes and establishment of interaxillary pathway  Avoided scar at infereior breast as this area is getting a little pink  Nipple appears to be red and swollen.     Passive ROM In Supine to Rt shoulder into flexion and abduction                   PT Education - 06/14/20 1614    Education Details how to get a compression sleeve    Person(s) Educated Patient    Methods Explanation;Demonstration;Handout    Comprehension Verbalized understanding               PT Long Term Goals - 05/17/20 1555      PT LONG TERM GOAL #1   Title Pt will be independent in a home exercise program for continued strengthening and stretching.    Status On-going      PT LONG TERM GOAL #2   Title Pt will report a 75% improvement in R neck and upper shoulder pain to allow  improved comfort    Status On-going      PT LONG TERM GOAL #3   Title Pt will report a 75% improvement in R inferior breast pain and swelling to allow improved comfort and to allow pt to receive radiation    Status On-going      PT LONG TERM GOAL #4   Title Pt will demonstrate 170 degrees of right shoulder flexion and abduction to allow her to return to prior level of function    Status On-going                 Plan - 06/14/20 1720    Clinical Impression Statement Pt is beginning to show redness at incision so precaution is taken to avoid this area.  She has some fullness at posterior axilla so extra time was spend on this area.    Stability/Clinical Decision Making Stable/Uncomplicated    Rehab Potential Excellent    PT Frequency 2x / week    PT Duration 4 weeks    PT Treatment/Interventions ADLs/Self Care Home Management;Patient/family education;Manual techniques;Therapeutic exercise;Manual lymph drainage;Compression bandaging;Taping;Scar mobilization;Passive range of motion;Vasopneumatic Device    PT Next Visit Plan continue to assess skin and avoid red areas with MLD to R breast, lateral trunk and axilla and posterior axilla, and reviewing with pt and assessing her technique, shoulder PROM, continue scar massage    Consulted and Agree with Plan of Care Patient           Patient will benefit from skilled therapeutic intervention in order to improve the following deficits and impairments:     Visit Diagnosis: Localized edema  Stiffness of right shoulder, not elsewhere classified  Abnormal posture  Muscle weakness (generalized)     Problem List Patient Active Problem List   Diagnosis Date Noted  . Carpal tunnel syndrome 03/09/2020  . Genetic testing 02/29/2020  . Family history of breast cancer   . Family history of ovarian cancer   . Family history of colon cancer   . Family history of prostate cancer   . Family history of skin cancer   . Family history of  leukemia   . Malignant neoplasm of lower-outer quadrant of right breast of female, estrogen receptor positive (HCC) 02/02/2020  . Osteopenia 01/25/2020  . B12 deficiency 01/04/2020  . Hearing loss 08/18/2018  . Hyperlipidemia 11/28/2016  . Anxiety   and depression 11/28/2016  . Chronic fatigue 11/28/2016  . Recurrent genital herpes 11/28/2016  . Melanoma of skin, site unspecified 06/26/2013   Donato Heinz. Owens Shark PT  Norwood Levo 06/14/2020, 5:24 PM  [place], [place], [address] Phone: [phone]   Fax:  [phone]  Name: Jean Morgan MRN: [id number] Date of Birth: July 06, 1961

## 2020-06-15 ENCOUNTER — Other Ambulatory Visit: Payer: Self-pay

## 2020-06-15 ENCOUNTER — Ambulatory Visit: Payer: BC Managed Care – PPO | Admitting: Radiation Oncology

## 2020-06-15 ENCOUNTER — Ambulatory Visit
Admission: RE | Admit: 2020-06-15 | Discharge: 2020-06-15 | Disposition: A | Discharge status: Home / Self Care | Payer: BC Managed Care – PPO | Source: Ambulatory Visit | Attending: Radiation Oncology | Admitting: Radiation Oncology

## 2020-06-15 DIAGNOSIS — Z510 Encounter for antineoplastic radiation therapy: Secondary | ICD-10-CM | POA: Diagnosis not present

## 2020-06-18 ENCOUNTER — Other Ambulatory Visit: Payer: Self-pay

## 2020-06-18 ENCOUNTER — Ambulatory Visit
Admission: RE | Admit: 2020-06-18 | Discharge: 2020-06-18 | Disposition: A | Discharge status: Home / Self Care | Payer: BC Managed Care – PPO | Source: Ambulatory Visit | Attending: Radiation Oncology | Admitting: Radiation Oncology

## 2020-06-18 DIAGNOSIS — Z510 Encounter for antineoplastic radiation therapy: Secondary | ICD-10-CM | POA: Diagnosis not present

## 2020-06-18 NOTE — Progress Notes (Signed)
[facility]   Telephone:([phone]) [phone] [phone]   Clinic Follow up Note   Patient Care Team: [name], [name], MD as PCP - General (Family Medicine) Rolm Bookbinder, MD as Consulting Physician (Dermatology) [name], RN as Oncology Nurse Navigator [name], RN as Oncology Nurse Navigator [name], MD as Consulting Physician (General Surgery) Truitt Merle, MD as Consulting Physician (Hematology) [name], MD as Consulting Physician (Radiation Oncology)  Date of Service:  06/21/2020  CHIEF COMPLAINT: F/u of right breast cancer  SUMMARY OF ONCOLOGIC HISTORY: Oncology History Overview Note  Cancer Staging Malignant neoplasm of lower-outer quadrant of right breast of female, estrogen receptor positive ([name]) Staging form: Breast, AJCC 8th Edition - Clinical stage from 01/31/2020: Stage IB (cT2, cN0, cM0, G2, ER+, PR+, HER2-) - Signed by Truitt Merle, MD on 02/07/2020    Malignant neoplasm of lower-outer quadrant of right breast of female, estrogen receptor positive ([name])  01/23/2020 Imaging   DEXA 01/23/20  Osteopenia with lowest T-score -1.6 at left femur neck  Site Region Measured Date Measured Age YA BMD Significant CHANGE T-score DualFemur Neck Left  01/23/2020    60.1         -1.6    0.815 g/cm2   AP Spine  L1-L4 (L3) 01/23/2020    60.1         -1.1    1.035 g/cm2   DualFemur Total Mean 01/23/2020    60.1         -1.2    0.851 g/cm2   01/23/2020 Mammogram   Diangostic Mammogram 01/23/20  IMPRESSION: 1. There is a highly suspicious mass in the right breast at 6 o'clock 3 cm from the nipple demonstrates an irregular hypoechoic shadowing mass measuring 2.3 x 1.4 x 1.7 cm associated with pleomorphic calcifications.   2.  No evidence of right axillary lymphadenopathy.   3. There is a benign-appearing cyst in the superior left breast at 12:30, 3 cm from the nipple demonstrates an anechoic oval circumscribed mass measuring 0.9 x 0.8 x 0.9 cm. No  evidence of left breast malignancy.    01/31/2020 Cancer Staging   Staging form: Breast, AJCC 8th Edition - Clinical stage from 01/31/2020: Stage IB (cT2, cN0, cM0, G2, ER+, PR+, HER2-) - Signed by Truitt Merle, MD on 02/07/2020   01/31/2020 Initial Biopsy   Diagnosis 01/31/20 Breast, right, needle core biopsy, 6 o'clock - INVASIVE MAMMARY CARCINOMA AND MAMMARY CARCINOMA IN SITU WITH PERINEURAL INVASION AND CALCIFICATIONS. Microscopic Comment The carcinoma is nuclear grade 2. The greatest linear extent of tumor in any one core is 16 mm. Immunohistochemistry of E-cadherin will be reported separately. Ancillary studies will be reported separately.   01/31/2020 Receptors her2   PROGNOSTIC INDICATORS Results: IMMUNOHISTOCHEMICAL AND MORPHOMETRIC ANALYSIS PERFORMED MANUALLY The tumor cells are NEGATIVE for Her2 (1+). Estrogen Receptor: 100%, POSITIVE, STRONG STAINING INTENSITY Progesterone Receptor: 80%, POSITIVE, STRONG STAINING INTENSITY Proliferation Marker Ki67: 15%   01/31/2020 Oncotype testing   Low risk with recurrence score 17.  Distant risk of recurrence at 9 years 5% with AI or Tamoxifen. There is less than 1% benefit of chemotherapy.    02/02/2020 Initial Diagnosis   Malignant neoplasm of lower-outer quadrant of right breast of female, estrogen receptor positive ([name])   02/17/2020 Imaging   Breast MRI  IMPRESSION: Known malignancy in the LOWER central portion of the RIGHT breast measuring 3.1 centimeters.   No adenopathy in the RIGHT axilla.   No additional areas of concern are  identified in either breast.     02/27/2020 -  Neo-Adjuvant Anti-estrogen oral therapy   Anastrozole 76m once daily starting in 02/27/20 with half tablet to see if tolerable.    02/28/2020 Genetic Testing   Negative genetic testing:  No pathogenic variants detected on the [facility] Multi-Cancer Panel. Two variants of uncertain significance were detected - one in the HOXB13 gene called c.215G>T and one in the RNF43  gene called c.2139A>T. The report date is 02/28/2020.  The Multi-Cancer Panel offered by [facility] includes sequencing and/or deletion duplication testing of the following 85 genes: AIP, ALK, APC, ATM, AXIN2,BAP1,  BARD1, BLM, BMPR1A, BRCA1, BRCA2, BRIP1, CASR, CDC73, CDH1, CDK4, CDKN1B, CDKN1C, CDKN2A (p14ARF), CDKN2A (p16INK4a), CEBPA, CHEK2, CTNNA1, DICER1, DIS3L2, EGFR (c.2369C>T, p.Thr790Met variant only), EPCAM (Deletion/duplication testing only), FH, FLCN, GATA2, GPC3, GREM1 (Promoter region deletion/duplication testing only), HOXB13 (c.251G>A, p.Gly84Glu), HRAS, KIT, MAX, MEN1, MET, MITF (c.952G>A, p.Glu318Lys variant only), MLH1, MSH2, MSH3, MSH6, MUTYH, NBN, NF1, NF2, NTHL1, PALB2, PDGFRA, PHOX2B, PMS2, POLD1, POLE, POT1, PRKAR1A, PTCH1, PTEN, RAD50, RAD51C, RAD51D, RB1, RECQL4, RET, RNF43, RUNX1, SDHAF2, SDHA (sequence changes only), SDHB, SDHC, SDHD, SMAD4, SMARCA4, SMARCB1, SMARCE1, STK11, SUFU, TERC, TERT, TMEM127, TP53, TSC1, TSC2, VHL, WRN and WT1.    04/19/2020 Surgery   RIGHT BREAST LUMPECTOMY WITH RADIOACTIVE SEED AND SENTINEL LYMPH NODE BIOPSY by Dr [name]   04/19/2020 Pathology Results   FINAL MICROSCOPIC DIAGNOSIS:   A. BREAST, RIGHT, LUMPECTOMY:  -  Invasive ductal carcinoma, Nottingham grade 2 of 3, 2.2 cm  -  Ductal carcinoma in-situ  -  Calcifications associated with carcinoma  -  Margins uninvolved by carcinoma (< 0.1 cm; posterior margin)  -  Previous biopsy site changes present  -  See oncology table and comment below   B. LYMPH NODE, RIGHT AXILLARY #1, SENTINEL, BIOPSY:  -  No carcinoma identified in one lymph node (0/1)  -  See comment   C. LYMPH NODE, RIGHT AXILLARY, SENTINEL, BIOPSY:  -  No carcinoma identified in one lymph node (0/1)  -  See comment   D. LYMPH NODE, RIGHT AXILLARY #2, SENTINEL, BIOPSY:  -  No carcinoma identified in one lymph node (0/1)  -  See comment   COMMENT:   A.  The previous biopsy and E-cadherin immunohistochemistry  (([id number]  was reviewed in conjunction with this case and supports  the above interpretation.   B-D. Given the lobular-like morphology, cytokeratin AE1/3 was performed  on the sentinel lymph nodes to exclude micrometastasis.  There is no  evidence of metastatic carcinoma by immunohistochemistry.    05/28/2020 - 06/22/2020 Radiation Therapy   Adjuvant Radiation with Dr MLisbeth Renshaw      CURRENT THERAPY:  Anastrozole 155monce daily starting in 02/27/20 with half tablet to see if tolerable.  Adjuvant Radiation with Dr MoLisbeth Renshaw/2/21-8/27/21  INTERVAL HISTORY:  ViSaje Gallops here for a follow up of right breast cancer. She presents to the clinic alone.  She is tolerating radiation well overall, has mild skin erythema and sensitivity, especially around nipple.  She will finish radiation tomorrow.  She has been on anastrozole for the past 3 to 4 months, overall tolerating well.  She does notice mild hair loss, mild dry cough, no sputum production, and occasional joint stiffness and pain.  Overall tolerable.  Her appetite and energy level decent, weight is stable, review of systems otherwise negative.   MEDICAL HISTORY:  Past Medical History:  Diagnosis Date  . Anxiety   . Cancer ([facility]  skin  . Depression   . Family history of adverse reaction to anesthesia    brother had severe agitation coming out of anesthesia, had to be re-intubated  . Family history of breast cancer   . Family history of colon cancer   . Family history of leukemia   . Family history of ovarian cancer   . Family history of prostate cancer   . Family history of skin cancer   . Heart murmur    "slight"  . Hypothyroidism   . Optic neuritis   . Rocky Mountain spotted fever    1970's    SURGICAL HISTORY: Past Surgical History:  Procedure Laterality Date  . BREAST LUMPECTOMY WITH RADIOACTIVE SEED AND SENTINEL LYMPH NODE BIOPSY Right 04/19/2020   Procedure: RIGHT BREAST LUMPECTOMY WITH RADIOACTIVE SEED AND SENTINEL LYMPH NODE  BIOPSY;  Surgeon: [name], MD;  Location: [facility];  Service: General;  Laterality: Right;  PEC BLOCK  . DENTAL SURGERY    . MELANOMA EXCISION  2006   Right forearm  . SENTINEL NODE BIOPSY Right 2006  . TONSILLECTOMY    . TONSILLECTOMY      I have reviewed the social history and family history with the patient and they are unchanged from previous note.  ALLERGIES:  is allergic to latex and oxycodone.  MEDICATIONS:  Current Outpatient Medications  Medication Sig Dispense Refill  . anastrozole (ARIMIDEX) 1 MG tablet TAKE 1 TABLET BY MOUTH EVERY DAY 90 tablet 0  . Calcium-Vitamin D-Vitamin K 440-102-72 MG-UNT-MCG CHEW Chew 2 each by mouth daily.    . cetirizine (ZYRTEC) 5 MG tablet Take 5 mg by mouth daily as needed for allergies.    . citalopram (CELEXA) 20 MG tablet Take 1 tablet (20 mg total) by mouth daily. 90 tablet 1  . diazepam (VALIUM) 5 MG tablet Take 0.5-1 tablets (2.5-5 mg total) by mouth every 12 (twelve) hours as needed for anxiety. Take 1-2 tabs 15 min before procedures. 40 tablet 1  . HYDROcodone-acetaminophen (NORCO/VICODIN) 5-325 MG tablet Take 1-2 tablets by mouth every 6 (six) hours as needed for moderate pain or severe pain. 15 tablet 0  . valACYclovir (VALTREX) 1000 MG tablet Start ASAP after symptoms onset and within 48 hours:1 tab daily for 5 days prn. 60 tablet 1   No current facility-administered medications for this visit.    PHYSICAL EXAMINATION: ECOG PERFORMANCE STATUS: 1 - Symptomatic but completely ambulatory  Vitals:   06/21/20 0816  BP: (!) 121/55  Pulse: 78  Resp: 17  Temp: 98 F (36.7 C)  SpO2: 100%   Filed Weights   06/21/20 0816  Weight: 144 lb 6.4 oz (65.5 kg)    GENERAL:alert, no distress and comfortable SKIN: skin color, texture, turgor are normal, no rashes or significant lesions EYES: normal, Conjunctiva are pink and non-injected, sclera clear NECK: supple, thyroid normal size, non-tender, without nodularity LYMPH:  no palpable  lymphadenopathy in the cervical, axillary  LUNGS: clear to auscultation and percussion with normal breathing effort HEART: regular rate & rhythm and no murmurs and no lower extremity edema ABDOMEN:abdomen soft, non-tender and normal bowel sounds Musculoskeletal:no cyanosis of digits and no clubbing  NEURO: alert & oriented x 3 with fluent speech, no focal motor/sensory deficits Breasts: Breast inspection showed them to be symmetrical with no nipple discharge.  Surgical scar in the right breast healed well.  Mild diffuse skin erythema of the right breast, no skin ulcers or discharge.  Palpation of the breasts and axilla revealed  no obvious mass that I could appreciate.   LABORATORY DATA:  I have reviewed the data as listed CBC Latest Ref Rng & Units 04/13/2020 02/08/2020 12/29/2019  WBC 4.0 - 10.5 K/uL 8.8 9.2 7.3  Hemoglobin 12.0 - 15.0 g/dL 13.7 12.8 12.8  Hematocrit 36 - 46 % 42.3 39.5 37.9  Platelets 150 - 400 K/uL 325 286 279.0     CMP Latest Ref Rng & Units 04/13/2020 02/08/2020 12/29/2019  Glucose 70 - 99 mg/dL 93 113(H) 92  BUN 6 - 20 mg/dL _0 Creatinine 0.44 - 1.00 mg/dL 0.78 0.82 0.77  Sodium 135 - 145 mmol/L 142 142 141  Potassium 3.5 - 5.1 mmol/L 3.9 4.1 3.8  Chloride 98 - 111 mmol/L 105 108 104  CO2 22 - 32 mmol/L _1 Calcium 8.9 - 10.3 mg/dL 9.6 9.4 9.6  Total Protein 6.5 - 8.1 g/dL - 7.5 7.0  Total Bilirubin 0.3 - 1.2 mg/dL - 0.4 0.5  Alkaline Phos 38 - 126 U/L - 85 79  AST 15 - 41 U/L - 20 17  ALT 0 - 44 U/L - 19 16      RADIOGRAPHIC STUDIES: I have personally reviewed the radiological images as listed and agreed with the findings in the report. No results found.   ASSESSMENT & PLAN:  Jean Morgan is a 59 y.o. female with    1.Malignant neoplasm of lower-outer quadrant of right breast, StageIb, p(T2N0M0), ER/PR+, HER2-, GradeII, Oncotype RS 17  -She was diagnosed in April 2021 --Her biopsy sample for Oncotype Dx testing showed low risk with RC  17. There is no significant benefit of chemotherapy and I will not recommend. -She underwent right breast lumpectomy with Dr Marlou Starks on 04/19/20. Her tumor was completely resected with clear margins and node negative.  I reviewed the pathology findings with her. -To reduce her risk of recurrence she started adjuvant Radiation with Dr Lisbeth Renshaw on 05/28/20. Plan to complete 06/22/20  -She started anastrozole before surgery for surprise been tolerating with mild side effects.  Will continue -We discussed breast cancer surveillance, I answered all her questions. -Survivorship in 3 months, I will see her back in 6 months.  2. Comorbidities: H/o of malignant Melanomain right forearm,B12 deficiency, Depression/Anxiety  -She has B12 deficiency which causes fatigue. 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 S/p removal.  -Continue Celexa, mood stable, did not get worse after she started anastrozole.   3. Genetic Testing  -negative   4. Osteopenia -She was recently diagnosed by 12/2019 DEXA.  -Will monitor on AI which can weaken her bone.  -continue Calcium and Vit D.   PLAN: -Continue anastrozole -She will finish radiation tomorrow -Survivorship in 3 months, I will see her back in 6 months with lab.  No problem-specific Assessment & Plan notes found for this encounter.   No orders of the defined types were placed in this encounter.  All questions were answered. The patient knows to call the clinic with any problems, questions or concerns. No barriers to learning was detected. The total time spent in the appointment was 30 minutes.     Truitt Merle, MD 06/21/2020   I, Joslyn Devon, am acting as scribe for Truitt Merle, MD.   I have reviewed the above documentation for accuracy and completeness, and I agree with the above.

## 2020-06-19 ENCOUNTER — Other Ambulatory Visit: Payer: Self-pay

## 2020-06-19 ENCOUNTER — Encounter: Payer: Self-pay | Admitting: Physical Therapy

## 2020-06-19 ENCOUNTER — Ambulatory Visit: Payer: BC Managed Care – PPO | Admitting: Physical Therapy

## 2020-06-19 ENCOUNTER — Ambulatory Visit
Admission: RE | Admit: 2020-06-19 | Discharge: 2020-06-19 | Disposition: A | Discharge status: Home / Self Care | Payer: BC Managed Care – PPO | Source: Ambulatory Visit | Attending: Radiation Oncology | Admitting: Radiation Oncology

## 2020-06-19 DIAGNOSIS — Z510 Encounter for antineoplastic radiation therapy: Secondary | ICD-10-CM | POA: Diagnosis not present

## 2020-06-19 DIAGNOSIS — R600 Localized edema: Secondary | ICD-10-CM

## 2020-06-19 NOTE — Therapy (Signed)
[place], [place], [address] Phone: [phone]   Fax:  [phone]  Physical Therapy Treatment  Patient Details  Name: Jean Morgan MRN: [id number] Date of Birth: 11-26-1960 Referring Provider (PT): [name]   Encounter Date: 06/19/2020   PT End of Session - 06/19/20 1130    Visit Number 11    Number of Visits 19    Date for PT Re-Evaluation 07/04/20    PT Start Time 0904    PT Stop Time 0959    PT Time Calculation (min) 55 min    Activity Tolerance Patient tolerated treatment well    Behavior During Therapy [facility] for tasks assessed/performed           Past Medical History:  Diagnosis Date  . Anxiety   . Cancer ([place])    skin  . Depression   . Family history of adverse reaction to anesthesia    brother had severe agitation coming out of anesthesia, had to be re-intubated  . Family history of breast cancer   . Family history of colon cancer   . Family history of leukemia   . Family history of ovarian cancer   . Family history of prostate cancer   . Family history of skin cancer   . Heart murmur    "slight"  . Hypothyroidism   . Optic neuritis   . 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 BREAST LUMPECTOMY WITH RADIOACTIVE SEED AND SENTINEL LYMPH NODE BIOPSY Right 04/19/2020   Procedure: RIGHT BREAST LUMPECTOMY WITH RADIOACTIVE SEED AND SENTINEL LYMPH NODE BIOPSY;  Surgeon: Jean Kussmaul, MD;  Location: [facility];  Service: General;  Laterality: Right;  PEC BLOCK  . DENTAL SURGERY    . MELANOMA EXCISION  2006   Right forearm  . SENTINEL NODE BIOPSY Right 2006  . TONSILLECTOMY    . TONSILLECTOMY      There were no vitals filed for this visit.   Subjective Assessment - 06/19/20 0906    Subjective I have 3 more days left of radiation. They started targeting areas today. I have started getting a rash.    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 with radiation to follow.  has started anastrozole    Patient Stated Goals to get rid of the tightness in her breast    Currently in Pain? Yes    Pain Score 3     Pain Location Axilla    Pain Orientation Right    Pain Descriptors / Indicators Discomfort    Pain Type Acute pain    Pain Onset In the past 7 days    Pain Frequency Constant                             [facility] Adult PT Treatment/Exercise - 06/19/20 0001      Manual Therapy   Soft tissue mobilization very lightly across fibrotic tissue avoiding any red areas    Manual Lymphatic Drainage (MLD) In supine: short neck, superficial and deep abdominals, right inguinal nodes and establishment of axillo inguinal pathway, Lt axillary  nodes and establishment of interaxillary pathway, R breast with focus on inferior breast in area  of fibrosis moving fluid towards pathways (avoided radiated skin that was red) then into Lt S/L for further work at lateral breast and trunk  redirected this towards Rt axillo-inguinal and added posterior inter-axillary anastomosis, then finished retracing steps in supine redirecting fluid to pathways and nodes- pt reports feeling relief after this                       PT Long Term Goals - 05/17/20 1555      PT LONG TERM GOAL #1   Title Pt will be independent in a home exercise program for continued strengthening and stretching.    Status On-going      PT LONG TERM GOAL #2   Title Pt will report a 75% improvement in R neck and upper shoulder pain to allow improved comfort    Status On-going      PT LONG TERM GOAL #3   Title Pt will report a 75% improvement in R inferior breast pain and swelling to allow improved comfort and to allow pt to receive radiation    Status  On-going      PT LONG TERM GOAL #4   Title Pt will demonstrate 170 degrees of right shoulder flexion and abduction to allow her to return to prior level of function    Status On-going                 Plan - 06/19/20 1128    Clinical Impression Statement Continued with MLD to R breast, anterior, lateral and posterior chest in area of fullness. Pt felt that this helped. She did not have any increased discomfort. Avoided area of radiated skin at axilla. Worked very lightly on area of fibrosis using small circles with soft tissue mobilization without causing any discomfort to pt. Avoided any skin that was red.    PT Frequency 2x / week    PT Duration 4 weeks    PT Treatment/Interventions ADLs/Self Care Home Management;Patient/family education;Manual techniques;Therapeutic exercise;Manual lymph drainage;Compression bandaging;Taping;Scar mobilization;Passive range of motion;Vasopneumatic Device    PT Next Visit Plan continue to assess skin and avoid red areas with MLD to R breast, lateral trunk and axilla and posterior axilla, and reviewing with pt and assessing her technique, shoulder PROM, continue scar massage    PT Home Exercise Plan post op breast; self MLD, supine scapular series, lying over foam roll    Consulted and Agree with Plan of Care Patient           Patient will benefit from skilled therapeutic intervention in order to improve the following deficits and impairments:  Postural dysfunction, Pain  Visit Diagnosis: Localized edema     Problem List Patient Active Problem List   Diagnosis Date Noted  . Carpal tunnel syndrome 03/09/2020  . Genetic testing 02/29/2020  . Family history of breast cancer   . Family history of ovarian cancer   . Family history of colon cancer   . Family history of prostate cancer   . Family history of skin cancer   . Family history of leukemia   . Malignant neoplasm of lower-outer quadrant of right breast of female, estrogen receptor  positive ([place]) 02/02/2020  . Osteopenia 01/25/2020  . B12 deficiency 01/04/2020  . Hearing loss 08/18/2018  . Hyperlipidemia 11/28/2016  . Anxiety and depression 11/28/2016  . Chronic fatigue 11/28/2016  . Recurrent genital herpes 11/28/2016  . Melanoma of skin, site unspecified 06/26/2013    Jean Morgan [facility] 06/19/2020, 11:33 AM  [place], [place], [address] Phone: [phone]   Fax:  [phone]  Name: Jean Morgan MRN: [id number] Date of Birth: [birth year]  Manus Gunning, PT 06/19/20 11:33 AM

## 2020-06-20 ENCOUNTER — Ambulatory Visit
Admission: RE | Admit: 2020-06-20 | Discharge: 2020-06-20 | Disposition: A | Discharge status: Home / Self Care | Payer: BC Managed Care – PPO | Source: Ambulatory Visit | Attending: Radiation Oncology | Admitting: Radiation Oncology

## 2020-06-20 ENCOUNTER — Other Ambulatory Visit: Payer: Self-pay

## 2020-06-20 DIAGNOSIS — Z510 Encounter for antineoplastic radiation therapy: Secondary | ICD-10-CM | POA: Diagnosis not present

## 2020-06-21 ENCOUNTER — Inpatient Hospital Stay: Payer: BC Managed Care – PPO | Attending: Hematology | Admitting: Hematology

## 2020-06-21 ENCOUNTER — Ambulatory Visit
Admission: RE | Admit: 2020-06-21 | Discharge: 2020-06-21 | Disposition: A | Discharge status: Home / Self Care | Payer: BC Managed Care – PPO | Source: Ambulatory Visit | Attending: Radiation Oncology | Admitting: Radiation Oncology

## 2020-06-21 ENCOUNTER — Encounter: Payer: Self-pay | Admitting: Hematology

## 2020-06-21 ENCOUNTER — Other Ambulatory Visit: Payer: Self-pay

## 2020-06-21 VITALS — BP 121/55 | HR 78 | Temp 98.0°F | Resp 17 | Ht 66.0 in | Wt 144.4 lb

## 2020-06-21 DIAGNOSIS — Z885 Allergy status to narcotic agent status: Secondary | ICD-10-CM | POA: Insufficient documentation

## 2020-06-21 DIAGNOSIS — Z800 Family history of malignant neoplasm of digestive organs: Secondary | ICD-10-CM | POA: Insufficient documentation

## 2020-06-21 DIAGNOSIS — Z803 Family history of malignant neoplasm of breast: Secondary | ICD-10-CM | POA: Diagnosis not present

## 2020-06-21 DIAGNOSIS — Z85820 Personal history of malignant melanoma of skin: Secondary | ICD-10-CM | POA: Insufficient documentation

## 2020-06-21 DIAGNOSIS — E538 Deficiency of other specified B group vitamins: Secondary | ICD-10-CM | POA: Diagnosis not present

## 2020-06-21 DIAGNOSIS — M2550 Pain in unspecified joint: Secondary | ICD-10-CM | POA: Diagnosis not present

## 2020-06-21 DIAGNOSIS — C50511 Malignant neoplasm of lower-outer quadrant of right female breast: Secondary | ICD-10-CM | POA: Insufficient documentation

## 2020-06-21 DIAGNOSIS — Z170 Estrogen receptor positive status [ER+]: Secondary | ICD-10-CM | POA: Diagnosis not present

## 2020-06-21 DIAGNOSIS — R05 Cough: Secondary | ICD-10-CM | POA: Diagnosis not present

## 2020-06-21 DIAGNOSIS — N6002 Solitary cyst of left breast: Secondary | ICD-10-CM | POA: Diagnosis not present

## 2020-06-21 DIAGNOSIS — L659 Nonscarring hair loss, unspecified: Secondary | ICD-10-CM | POA: Diagnosis not present

## 2020-06-21 DIAGNOSIS — Z8041 Family history of malignant neoplasm of ovary: Secondary | ICD-10-CM | POA: Insufficient documentation

## 2020-06-21 DIAGNOSIS — Z806 Family history of leukemia: Secondary | ICD-10-CM | POA: Diagnosis not present

## 2020-06-21 DIAGNOSIS — Z79811 Long term (current) use of aromatase inhibitors: Secondary | ICD-10-CM | POA: Diagnosis not present

## 2020-06-21 DIAGNOSIS — Z8042 Family history of malignant neoplasm of prostate: Secondary | ICD-10-CM | POA: Insufficient documentation

## 2020-06-21 DIAGNOSIS — Z808 Family history of malignant neoplasm of other organs or systems: Secondary | ICD-10-CM | POA: Diagnosis not present

## 2020-06-21 DIAGNOSIS — R5383 Other fatigue: Secondary | ICD-10-CM | POA: Diagnosis not present

## 2020-06-21 DIAGNOSIS — M85852 Other specified disorders of bone density and structure, left thigh: Secondary | ICD-10-CM | POA: Diagnosis not present

## 2020-06-21 DIAGNOSIS — Z79899 Other long term (current) drug therapy: Secondary | ICD-10-CM | POA: Insufficient documentation

## 2020-06-21 DIAGNOSIS — Z510 Encounter for antineoplastic radiation therapy: Secondary | ICD-10-CM | POA: Diagnosis not present

## 2020-06-22 ENCOUNTER — Other Ambulatory Visit: Payer: Self-pay

## 2020-06-22 ENCOUNTER — Encounter: Payer: Self-pay | Admitting: Radiation Oncology

## 2020-06-22 ENCOUNTER — Encounter: Payer: Self-pay | Admitting: *Deleted

## 2020-06-22 ENCOUNTER — Telehealth: Payer: Self-pay | Admitting: Hematology

## 2020-06-22 ENCOUNTER — Ambulatory Visit
Admission: RE | Admit: 2020-06-22 | Discharge: 2020-06-22 | Disposition: A | Discharge status: Home / Self Care | Payer: BC Managed Care – PPO | Source: Ambulatory Visit | Attending: Radiation Oncology | Admitting: Radiation Oncology

## 2020-06-22 DIAGNOSIS — Z510 Encounter for antineoplastic radiation therapy: Secondary | ICD-10-CM | POA: Diagnosis not present

## 2020-06-22 NOTE — Telephone Encounter (Signed)
Scheduled per 8/26 los. Pt is aware of appt times and dates.

## 2020-07-09 ENCOUNTER — Encounter: Payer: Self-pay | Admitting: Family Medicine

## 2020-07-09 ENCOUNTER — Ambulatory Visit (INDEPENDENT_AMBULATORY_CARE_PROVIDER_SITE_OTHER): Payer: BC Managed Care – PPO | Admitting: Family Medicine

## 2020-07-09 ENCOUNTER — Other Ambulatory Visit: Payer: Self-pay

## 2020-07-09 VITALS — BP 128/70 | HR 64 | Resp 16 | Ht 66.0 in | Wt 145.5 lb

## 2020-07-09 DIAGNOSIS — E785 Hyperlipidemia, unspecified: Secondary | ICD-10-CM | POA: Diagnosis not present

## 2020-07-09 DIAGNOSIS — F419 Anxiety disorder, unspecified: Secondary | ICD-10-CM

## 2020-07-09 DIAGNOSIS — H6983 Other specified disorders of Eustachian tube, bilateral: Secondary | ICD-10-CM

## 2020-07-09 DIAGNOSIS — H9191 Unspecified hearing loss, right ear: Secondary | ICD-10-CM | POA: Diagnosis not present

## 2020-07-09 DIAGNOSIS — I739 Peripheral vascular disease, unspecified: Secondary | ICD-10-CM | POA: Insufficient documentation

## 2020-07-09 NOTE — Patient Instructions (Addendum)
A few things to remember from today's visit:   No diagnosis found.  If you need refills please call your pharmacy. Do not use My Chart to request refills or for acute issues that need immediate attention.  Awaiting appt sent to Dr Joylene Igo Dr. Leonides Sake. Lucia Gaskins, MD Address: [address], [place], [place] [address], [place], [place] [address] Phone: [phone]  Please be sure medication list is accurate. If a new problem present, please set up appointment sooner than planned today.

## 2020-07-09 NOTE — Progress Notes (Signed)
HPI: [name] is a 59 y.o. female, who is here today for follow up.   She was last seen on 05/04/20 HLD: She is on non pharmacologic treatment. She follows a healthful diet.  PAD: ABI done 06/06/20. Right: Resting right ankle-brachial index is within normal range. No evidence of significant right lower extremity arterial disease. The right toe-brachial index is abnormal. RT great toe pressure = 95 mmHg.   Left: Resting left ankle-brachial index is within normal range. No evidence of significant left lower extremity arterial disease. The left toe-brachial index is normal. LT Great toe pressure = 133 mmHg.  Cold feet sensation but no pain or claudication.  Lab Results  Component Value Date   CHOL 259 (H) 12/29/2019   HDL 61.60 12/29/2019   LDLCALC 178 (H) 12/29/2019   TRIG 94.0 12/29/2019   CHOLHDL 4 12/29/2019    Anxiety: She is on Celexa 20 mg daily and Diazepam 5 mg bid. She doe snot take Diazepam daily. Problem has improved. She is not sure if because medication or because she is feeling better after right breast surgery, pain has improved. Tolerating medication well.  Feeling ear fulness bilateral. She thinks she may have fluid behind ears. She tried to arrange appt with audiologist but was told she needed to see ENT first. She has been referred to ENT in the past. Hx of hearing loss, stable. She has not noted earache,ear drainage, sore throat, or nasal congestion. She has not tried OTC meds.  Review of Systems  Constitutional: Positive for fatigue. Negative for activity change, appetite change and fever.  HENT: Negative for mouth sores and nosebleeds.   Respiratory: Negative for cough, shortness of breath and wheezing.   Cardiovascular: Negative for chest pain, palpitations and leg swelling.  Gastrointestinal: Negative for abdominal pain, nausea and vomiting.       Negative for changes in bowel habits.  Neurological: Negative for syncope and weakness.   Psychiatric/Behavioral: Negative for confusion. The patient is nervous/anxious.   Rest of ROS, see pertinent positives sand negatives in HPI  Current Outpatient Medications on File Prior to Visit  Medication Sig Dispense Refill  . anastrozole (ARIMIDEX) 1 MG tablet TAKE 1 TABLET BY MOUTH EVERY DAY 90 tablet 0  . Calcium-Vitamin D-Vitamin K [id number] MG-UNT-MCG CHEW Chew 2 each by mouth daily.    . cetirizine (ZYRTEC) 5 MG tablet Take 5 mg by mouth daily as needed for allergies.    . citalopram (CELEXA) 20 MG tablet Take 1 tablet (20 mg total) by mouth daily. 90 tablet 1  . diazepam (VALIUM) 5 MG tablet Take 0.5-1 tablets (2.5-5 mg total) by mouth every 12 (twelve) hours as needed for anxiety. Take 1-2 tabs 15 min before procedures. 40 tablet 1  . HYDROcodone-acetaminophen (NORCO/VICODIN) 5-325 MG tablet Take 1-2 tablets by mouth every 6 (six) hours as needed for moderate pain or severe pain. 15 tablet 0  . valACYclovir (VALTREX) 1000 MG tablet Start ASAP after symptoms onset and within 48 hours:1 tab daily for 5 days prn. 60 tablet 1   No current facility-administered medications on file prior to visit.   Past Medical History:  Diagnosis Date  . Anxiety   . Cancer ([place])    skin  . Depression   . Family history of adverse reaction to anesthesia    brother had severe agitation coming out of anesthesia, had to be re-intubated  . Family history of breast cancer   . Family history of colon cancer   .  Family history of leukemia   . Family history of ovarian cancer   . Family history of prostate cancer   . Family history of skin cancer   . Heart murmur    "slight"  . Hypothyroidism   . Optic neuritis   . Rocky Mountain spotted fever    1970's   Allergies  Allergen Reactions  . Latex Itching  . Oxycodone Other (See Comments)    Other Reaction: "too strong a drug for me"    Social History   Socioeconomic History  . Marital status: Married    Spouse name: Not on file  .  Number of children: 1  . Years of education: Not on file  . Highest education level: Not on file  Occupational History  . Not on file  Tobacco Use  . Smoking status: Never Smoker  . Smokeless tobacco: Never Used  Vaping Use  . Vaping Use: Never used  Substance and Sexual Activity  . Alcohol use: Yes    Alcohol/week: 7.0 standard drinks    Types: 7 Glasses of wine per week  . Drug use: No  . Sexual activity: Not on file  Other Topics Concern  . Not on file  Social History Narrative  . Not on file   Social Determinants of Health   Financial Resource Strain:   . Difficulty of Paying Living Expenses: Not on file  Food Insecurity:   . Worried About Charity fundraiser in the Last Year: Not on file  . Ran Out of Food in the Last Year: Not on file  Transportation Needs:   . Lack of Transportation (Medical): Not on file  . Lack of Transportation (Non-Medical): Not on file  Physical Activity:   . Days of Exercise per Week: Not on file  . Minutes of Exercise per Session: Not on file  Stress:   . Feeling of Stress : Not on file  Social Connections:   . Frequency of Communication with Friends and Family: Not on file  . Frequency of Social Gatherings with Friends and Family: Not on file  . Attends Religious Services: Not on file  . Active Member of Clubs or Organizations: Not on file  . Attends Archivist Meetings: Not on file  . Marital Status: Not on file    Vitals:   07/09/20 1545  BP: 128/70  Pulse: 64  Resp: 16  SpO2: 99%   Wt Readings from Last 3 Encounters:  07/09/20 145 lb 8 oz (66 kg)  06/21/20 144 lb 6.4 oz (65.5 kg)  05/04/20 145 lb (65.8 kg)   Body mass index is 23.48 kg/m.  Physical Exam Vitals and nursing note reviewed.  Constitutional:      General: She is not in acute distress.    Appearance: She is well-developed, well-groomed and normal weight.  HENT:     Head: Normocephalic and atraumatic.     Right Ear: Ear canal normal. Tympanic  membrane is scarred.     Left Ear: Ear canal normal. Tympanic membrane is bulging.     Mouth/Throat:     Mouth: Mucous membranes are moist.     Pharynx: Oropharynx is clear.  Eyes:     Conjunctiva/sclera: Conjunctivae normal.  Cardiovascular:     Rate and Rhythm: Normal rate and regular rhythm.     Heart sounds: No murmur heard.      Comments: DP pulses present. Pulmonary:     Effort: Pulmonary effort is normal. No respiratory distress.  Breath sounds: Normal breath sounds.  Abdominal:     Palpations: Abdomen is soft. There is no hepatomegaly or mass.     Tenderness: There is no abdominal tenderness.  Lymphadenopathy:     Cervical: No cervical adenopathy.  Skin:    General: Skin is warm.     Findings: No erythema or rash.  Neurological:     Mental Status: She is alert and oriented to person, place, and time.     Cranial Nerves: No cranial nerve deficit.     Gait: Gait normal.  Psychiatric:        Mood and Affect: Affect normal. Mood is anxious.     Comments: Well groomed, good eye contact.   ASSESSMENT AND PLAN:  Ms. Jean Morgan was seen today for follow-up.  Diagnoses and all orders for this visit:  Hyperlipidemia, unspecified hyperlipidemia type She is not interested in pharmacologic treatment for now. Continue low fat diet. We will re-evaluate in a few months.  Anxiety disorder, unspecified type Improved. For now continue Celexa and Diazepam.  Dysfunction of both eustachian tubes Autoinflation maneuvers a few times through the day may help. ENT information given, she will call and arrange appt.  PAD (peripheral artery disease) ([facility]) We discussed ABI results. Mild. Aspirin 81 mg daily is recommended. Prefers to hold on statin.  Hearing loss She will try to arrange appt with ENT, information given. She will let me know if she needs another referral.  Return in about 6 months (around 01/06/2021) for HLD,anxiety.  Rachit Grim G. [name], MD  [facility]. [place] office.   A few things to remember from today's visit:   No diagnosis found.  If you need refills please call your pharmacy. Do not use My Chart to request refills or for acute issues that need immediate attention.  Awaiting appt sent to Dr Joylene Igo Dr. Leonides Sake. Lucia Gaskins, MD Address: [address] [place], [place] [address], [place], [place] [address] Phone: [phone]  Please be sure medication list is accurate. If a new problem present, please set up appointment sooner than planned today.

## 2020-07-09 NOTE — Assessment & Plan Note (Signed)
She will try to arrange appt with ENT, information given. She will let me know if she needs another referral.

## 2020-07-10 ENCOUNTER — Encounter: Payer: Self-pay | Admitting: Physical Therapy

## 2020-07-10 ENCOUNTER — Ambulatory Visit: Payer: BC Managed Care – PPO | Attending: General Surgery | Admitting: Physical Therapy

## 2020-07-10 DIAGNOSIS — M25511 Pain in right shoulder: Secondary | ICD-10-CM | POA: Diagnosis present

## 2020-07-10 DIAGNOSIS — M6281 Muscle weakness (generalized): Secondary | ICD-10-CM | POA: Insufficient documentation

## 2020-07-10 DIAGNOSIS — R600 Localized edema: Secondary | ICD-10-CM | POA: Diagnosis not present

## 2020-07-10 DIAGNOSIS — M25611 Stiffness of right shoulder, not elsewhere classified: Secondary | ICD-10-CM | POA: Diagnosis present

## 2020-07-10 DIAGNOSIS — R293 Abnormal posture: Secondary | ICD-10-CM | POA: Insufficient documentation

## 2020-07-10 DIAGNOSIS — M542 Cervicalgia: Secondary | ICD-10-CM | POA: Insufficient documentation

## 2020-07-10 DIAGNOSIS — G8929 Other chronic pain: Secondary | ICD-10-CM | POA: Diagnosis present

## 2020-07-10 NOTE — Therapy (Signed)
[place], [place], [address] Phone: [phone]   Fax:  [phone]  Physical Therapy Treatment  Patient Details  Name: Jean Morgan MRN: [id number] Date of Birth: Mar 16, 1961 Referring Provider (PT): [name]   Encounter Date: 07/10/2020   PT End of Session - 07/10/20 1514    Visit Number 12    Number of Visits 20    Date for PT Re-Evaluation 08/07/20    PT Start Time 0109    PT Stop Time 1558    PT Time Calculation (min) 52 min    Activity Tolerance Patient tolerated treatment well    Behavior During Therapy [facility] for tasks assessed/performed           Past Medical History:  Diagnosis Date  . Anxiety   . Cancer ([place])    skin  . Depression   . Family history of adverse reaction to anesthesia    brother had severe agitation coming out of anesthesia, had to be re-intubated  . Family history of breast cancer   . Family history of colon cancer   . Family history of leukemia   . Family history of ovarian cancer   . Family history of prostate cancer   . Family history of skin cancer   . Heart murmur    "slight"  . Hypothyroidism   . Optic neuritis   . 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 BREAST LUMPECTOMY WITH RADIOACTIVE SEED AND SENTINEL LYMPH NODE BIOPSY Right 04/19/2020   Procedure: RIGHT BREAST LUMPECTOMY WITH RADIOACTIVE SEED AND SENTINEL LYMPH NODE BIOPSY;  Surgeon: [name], MD;  Location: [facility];  Service: General;  Laterality: Right;  PEC BLOCK  . DENTAL SURGERY    . MELANOMA EXCISION  2006   Right forearm  . SENTINEL NODE BIOPSY Right 2006  . TONSILLECTOMY    . TONSILLECTOMY      There were no vitals filed for this visit.   Subjective Assessment - 07/10/20 1508    Subjective Most of the radiation burns have gone away. I have a little pain in area of my lymph nodes and tenderness at the nipple and under the  breast.    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 with radiation to follow.  has started anastrozole    Patient Stated Goals to get rid of the tightness in her breast    Currently in Pain? Yes    Pain Score 2     Pain Location Axilla    Pain Orientation Right    Pain Descriptors / Indicators Tender    Pain Type Acute pain    Pain Onset More than a month ago    Pain Frequency Constant              [facility] PT Assessment - 07/10/20 0001      AROM   Right Shoulder Flexion 170 Degrees    Right Shoulder ABduction 179 Degrees                         [facility] Adult PT Treatment/Exercise - 07/10/20 0001      Manual Therapy   Soft tissue mobilization across fibrotic tissue in area of  lumpectomy scar and using coconut oil to lateral trunk in area of serratus where pt has increased tightness and tenderness    Manual Lymphatic Drainage (MLD) In supine: short neck, superficial and deep abdominals, right inguinal nodes and establishment of axillo inguinal pathway, Lt axillary  nodes and establishment of interaxillary pathway, R breast with focus on inferior breast in area of fibrosis moving fluid towards pathways finished retracing steps in supine redirecting fluid to pathways and nodes- focused MLD on in area of tightness and tenderness just superior to lumpectomy scar and this area of softened greatly and pt reported decreased discomfort                       PT Long Term Goals - 07/10/20 1510      PT LONG TERM GOAL #1   Title Pt will be independent in a home exercise program for continued strengthening and stretching.    Time 4    Period Weeks    Status On-going      PT LONG TERM GOAL #2   Title Pt will report a 75% improvement in R neck and upper shoulder pain to allow  improved comfort    Baseline 07/10/20- 75% improvement    Time 4    Period Weeks    Status Achieved      PT LONG TERM GOAL #3   Title Pt will report a 75% improvement in R inferior breast pain and swelling to allow improved comfort and to allow pt to receive radiation    Baseline 07/10/20- 25% improvement    Time 4    Period Weeks    Status On-going      PT LONG TERM GOAL #4   Title Pt will demonstrate 170 degrees of right shoulder flexion and abduction to allow her to return to prior level of function    Baseline flexion 150, abduction 158; 07/10/20- flexion 170, abduction 179    Time 4    Period Weeks    Status Achieved      PT LONG TERM GOAL #5   Title There will be a 50% improvement in scar tissue to allow improved comfort.    Time 4    Period Weeks    Status New    Target Date 08/07/20                 Plan - 07/10/20 1607    Clinical Impression Statement Reassessed pt's progress towards goals. Pt has  now completed radiation and her skin has healed. She has met her ROM and her goal for decreased shoulder and neck pain. Pt still has considerable scar tissue and fibrosis with some edema in inferior breast and would benefit from continued skilled PT services to help address this. She still has pulling and tightness at end range.    PT Frequency 2x / week    PT Duration 4 weeks    PT Treatment/Interventions ADLs/Self Care Home Management;Patient/family education;Manual techniques;Therapeutic exercise;Manual lymph drainage;Compression bandaging;Taping;Scar mobilization;Passive range of motion;Vasopneumatic Device    PT Next Visit Plan MLD to R breast, lateral trunk and axilla and posterior axilla, and reviewing with pt and assessing her technique, shoulder PROM, continue scar massage    PT Home Exercise Plan post op breast; self MLD, supine scapular series, lying over foam roll    Consulted and Agree with Plan of Care Patient           Patient will benefit from skilled  therapeutic  intervention in order to improve the following deficits and impairments:  Postural dysfunction, Pain, Increased edema, Decreased scar mobility, Increased fascial restricitons  Visit Diagnosis: Localized edema  Stiffness of right shoulder, not elsewhere classified  Abnormal posture  Muscle weakness (generalized)  Cervicalgia  Chronic right shoulder pain     Problem List Patient Active Problem List   Diagnosis Date Noted  . PAD (peripheral artery disease) ([place]) 07/09/2020  . Carpal tunnel syndrome 03/09/2020  . Genetic testing 02/29/2020  . Family history of breast cancer   . Family history of ovarian cancer   . Family history of colon cancer   . Family history of prostate cancer   . Family history of skin cancer   . Family history of leukemia   . Malignant neoplasm of lower-outer quadrant of right breast of female, estrogen receptor positive ([place]) 02/02/2020  . Osteopenia 01/25/2020  . B12 deficiency 01/04/2020  . Hearing loss 08/18/2018  . Hyperlipidemia 11/28/2016  . Anxiety and depression 11/28/2016  . Chronic fatigue 11/28/2016  . Recurrent genital herpes 11/28/2016  . Melanoma of skin, site unspecified 06/26/2013    Allyson Sabal [facility] 07/10/2020, 4:09 PM  [place], [place], [address] Phone: [phone]   Fax:  [phone]  Name: Devon Kingdon MRN: [id number] Date of Birth: September 24, 1961  Manus Gunning, PT 07/10/20 4:10 PM

## 2020-07-11 NOTE — Progress Notes (Signed)
  Radiation Oncology         ([phone]) [phone] ________________________________  Name: Jean Morgan MRN: [id number]  Date: 06/22/2020  DOB: 04-10-1961  End of Treatment Note  Diagnosis:   right-sided breast cancer     Indication for treatment:  Curative       Radiation treatment dates:   05/28/20 - 06/22/20  Site/dose:   The patient initially received a dose of 42.56 Gy in 16 fractions to the breast using whole-breast tangent fields. This was delivered using a 3-D conformal technique. The patient then received a boost to the seroma. This delivered an additional 8 Gy in 4 fractions using an en face electron field due to the depth of the seroma. The total dose was 50.56Gy.   Narrative: The patient tolerated radiation treatment relatively well.   The patient had some expected skin irritation as she progressed during treatment.    Plan: The patient has completed radiation treatment. The patient will return to radiation oncology clinic for routine followup in one month. I advised the patient to call or return sooner if they have any questions or concerns related to their recovery or treatment. ________________________________  Jodelle Gross, M.D., Ph.D.

## 2020-07-12 ENCOUNTER — Other Ambulatory Visit: Payer: Self-pay

## 2020-07-12 ENCOUNTER — Encounter: Payer: Self-pay | Admitting: Physical Therapy

## 2020-07-12 ENCOUNTER — Ambulatory Visit: Payer: BC Managed Care – PPO | Admitting: Physical Therapy

## 2020-07-12 DIAGNOSIS — R600 Localized edema: Secondary | ICD-10-CM

## 2020-07-12 DIAGNOSIS — M25611 Stiffness of right shoulder, not elsewhere classified: Secondary | ICD-10-CM

## 2020-07-12 NOTE — Therapy (Signed)
[place], [place], [address] Phone: [phone]   Fax:  [phone]  Physical Therapy Treatment  Patient Details  Name: Jean Morgan MRN: [id number] Date of Birth: Aug 07, 1961 Referring Provider (PT): [name]   Encounter Date: 07/12/2020   PT End of Session - 07/12/20 1607    Visit Number 13    Number of Visits 20    Date for PT Re-Evaluation 08/07/20    PT Start Time 1583    PT Stop Time 1558    PT Time Calculation (min) 52 min    Activity Tolerance Patient tolerated treatment well    Behavior During Therapy [facility] for tasks assessed/performed           Past Medical History:  Diagnosis Date  . Anxiety   . Cancer ([place])    skin  . Depression   . Family history of adverse reaction to anesthesia    brother had severe agitation coming out of anesthesia, had to be re-intubated  . Family history of breast cancer   . Family history of colon cancer   . Family history of leukemia   . Family history of ovarian cancer   . Family history of prostate cancer   . Family history of skin cancer   . Heart murmur    "slight"  . Hypothyroidism   . Optic neuritis   . 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 BREAST LUMPECTOMY WITH RADIOACTIVE SEED AND SENTINEL LYMPH NODE BIOPSY Right 04/19/2020   Procedure: RIGHT BREAST LUMPECTOMY WITH RADIOACTIVE SEED AND SENTINEL LYMPH NODE BIOPSY;  Surgeon: [name], MD;  Location: [facility];  Service: General;  Laterality: Right;  PEC BLOCK  . DENTAL SURGERY    . MELANOMA EXCISION  2006   Right forearm  . SENTINEL NODE BIOPSY Right 2006  . TONSILLECTOMY    . TONSILLECTOMY      There were no vitals filed for this visit.   Subjective Assessment - 07/12/20 1508    Subjective The tightness is okay. It is still tight though.    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 with radiation to follow.  has started anastrozole    Patient Stated Goals to get rid of the tightness in her breast    Currently in Pain? Yes    Pain Score 2     Pain Location Axilla    Pain Orientation Right    Pain Descriptors / Indicators Tender    Pain Type Acute pain    Pain Onset More than a month ago                             [facility] Adult PT Treatment/Exercise - 07/12/20 0001      Manual Therapy   Soft tissue mobilization along areas of fibrosis throughout R breast and along lumpectomy scar    Manual Lymphatic Drainage (MLD) In supine: short neck, superficial and deep abdominals, right inguinal nodes and establishment of axillo inguinal pathway, Lt axillary  nodes and establishment of interaxillary pathway, R breast with focus on inferior breast in area of fibrosis moving fluid towards pathways finished retracing steps in supine redirecting fluid to  pathways and nodes                       PT Long Term Goals - 07/10/20 1510      PT LONG TERM GOAL #1   Title Pt will be independent in a home exercise program for continued strengthening and stretching.    Time 4    Period Weeks    Status On-going      PT LONG TERM GOAL #2   Title Pt will report a 75% improvement in R neck and upper shoulder pain to allow improved comfort    Baseline 07/10/20- 75% improvement    Time 4    Period Weeks    Status Achieved      PT LONG TERM GOAL #3   Title Pt will report a 75% improvement in R inferior breast pain and swelling to allow improved comfort and to allow pt to receive radiation    Baseline 07/10/20- 25% improvement    Time 4    Period Weeks    Status On-going      PT LONG TERM GOAL #4   Title Pt will demonstrate 170 degrees of right shoulder flexion and abduction to allow her to return to prior  level of function    Baseline flexion 150, abduction 158; 07/10/20- flexion 170, abduction 179    Time 4    Period Weeks    Status Achieved      PT LONG TERM GOAL #5   Title There will be a 50% improvement in scar tissue to allow improved comfort.    Time 4    Period Weeks    Status New    Target Date 08/07/20                 Plan - 07/12/20 1608    Clinical Impression Statement Continued with manual therapy to right chest to reduce edema and to also reduce fibrotic areas throughout right breast. Pt has some shiny areas around lateral and inferior nipple that she reports had some drainage yesterday. Avoided working in these areas. Educated pt that if it become red or drainage increases to contact her doctor. Some softening of fibrosis noted by end of session.    PT Frequency 2x / week    PT Duration 4 weeks    PT Treatment/Interventions ADLs/Self Care Home Management;Patient/family education;Manual techniques;Therapeutic exercise;Manual lymph drainage;Compression bandaging;Taping;Scar mobilization;Passive range of motion;Vasopneumatic Device    PT Next Visit Plan MLD to R breast, lateral trunk and axilla and posterior axilla, and reviewing with pt and assessing her technique, shoulder PROM, continue scar massage    PT Home Exercise Plan post op breast; self MLD, supine scapular series, lying over foam roll    Consulted and Agree with Plan of Care Patient           Patient will benefit from skilled therapeutic intervention in order to improve the following deficits and impairments:  Postural dysfunction, Pain, Increased edema, Decreased scar mobility, Increased fascial restricitons  Visit Diagnosis: Localized edema  Stiffness of right shoulder, not elsewhere classified     Problem List Patient Active Problem List   Diagnosis Date Noted  . PAD (peripheral artery disease) ([place]) 07/09/2020  . Carpal tunnel syndrome 03/09/2020  . Genetic testing 02/29/2020  . Family  history of breast cancer   . Family history of ovarian cancer   . Family history of colon cancer   . Family history of prostate cancer   .  Family history of skin cancer   . Family history of leukemia   . Malignant neoplasm of lower-outer quadrant of right breast of female, estrogen receptor positive ([place]) 02/02/2020  . Osteopenia 01/25/2020  . B12 deficiency 01/04/2020  . Hearing loss 08/18/2018  . Hyperlipidemia 11/28/2016  . Anxiety and depression 11/28/2016  . Chronic fatigue 11/28/2016  . Recurrent genital herpes 11/28/2016  . Melanoma of skin, site unspecified 06/26/2013    [facility] 07/12/2020, 4:11 PM  [place], [place], [address] Phone: [phone]   Fax:  [phone]  Name: Katrine Radich MRN: [id number] Date of Birth: August 16, 1961  Manus Gunning, PT 07/12/20 4:11 PM

## 2020-07-18 ENCOUNTER — Encounter: Payer: BC Managed Care – PPO | Admitting: Physical Therapy

## 2020-07-19 ENCOUNTER — Other Ambulatory Visit: Payer: Self-pay

## 2020-07-19 ENCOUNTER — Ambulatory Visit: Payer: BC Managed Care – PPO | Admitting: Physical Therapy

## 2020-07-19 ENCOUNTER — Encounter: Payer: Self-pay | Admitting: Physical Therapy

## 2020-07-19 DIAGNOSIS — M25611 Stiffness of right shoulder, not elsewhere classified: Secondary | ICD-10-CM

## 2020-07-19 DIAGNOSIS — R293 Abnormal posture: Secondary | ICD-10-CM

## 2020-07-19 DIAGNOSIS — R600 Localized edema: Secondary | ICD-10-CM | POA: Diagnosis not present

## 2020-07-19 DIAGNOSIS — G8929 Other chronic pain: Secondary | ICD-10-CM

## 2020-07-19 DIAGNOSIS — M6281 Muscle weakness (generalized): Secondary | ICD-10-CM

## 2020-07-19 NOTE — Therapy (Signed)
[place], [place], [address] Phone: [phone]   Fax:  [phone]  Physical Therapy Treatment  Patient Details  Name: Jean Morgan MRN: [id number] Date of Birth: [birth year] Referring Provider (PT): [name]   Encounter Date: 07/19/2020   PT End of Session - 07/19/20 1705    Visit Number 14    Number of Visits 20    PT Start Time 1410    PT Stop Time 1450    PT Time Calculation (min) 40 min    Activity Tolerance Patient tolerated treatment well    Behavior During Therapy [facility] for tasks assessed/performed           Past Medical History:  Diagnosis Date  . Anxiety   . Cancer ([place])    skin  . Depression   . Family history of adverse reaction to anesthesia    brother had severe agitation coming out of anesthesia, had to be re-intubated  . Family history of breast cancer   . Family history of colon cancer   . Family history of leukemia   . Family history of ovarian cancer   . Family history of prostate cancer   . Family history of skin cancer   . Heart murmur    "slight"  . Hypothyroidism   . Optic neuritis   . 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 BREAST LUMPECTOMY WITH RADIOACTIVE SEED AND SENTINEL LYMPH NODE BIOPSY Right 04/19/2020   Procedure: RIGHT BREAST LUMPECTOMY WITH RADIOACTIVE SEED AND SENTINEL LYMPH NODE BIOPSY;  Surgeon: [name], MD;  Location: [facility];  Service: General;  Laterality: Right;  PEC BLOCK  . DENTAL SURGERY    . MELANOMA EXCISION  2006   Right forearm  . SENTINEL NODE BIOPSY Right 2006  . TONSILLECTOMY    . TONSILLECTOMY      There were no vitals filed for this visit.   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 with radiation to follow.  has started anastrozole    Patient Stated Goals to get rid of the tightness in her breast    Currently in Pain? No/denies                             [facility] Adult PT Treatment/Exercise - 07/19/20 0001      Manual Therapy   Manual Therapy Edema management;Manual Lymphatic Drainage (MLD)    Edema Management gave pt piece of double while foam to wear inside bra at incision to protect it from rubbing against bra band     Manual Lymphatic Drainage (MLD) In supine: short neck, superficial and deep abdominals, right inguinal nodes and establishment of axillo inguinal pathway, Lt axillary  nodes and establishment of interaxillary pathway, R breast with focus on inferior breast in area of fibrosis moving fluid towards pathways finished retracing steps in supine redirecting fluid to pathways and nodes                       PT Long Term Goals - 07/10/20  [place] #1   Title Pt will be independent in a home exercise program for continued strengthening and stretching.    Time 4    Period Weeks    Status On-going      PT LONG TERM GOAL #2   Title Pt will report a 75% improvement in R neck and upper shoulder pain to allow improved comfort    Baseline 07/10/20- 75% improvement    Time 4    Period Weeks    Status Achieved      PT LONG TERM GOAL #3   Title Pt will report a 75% improvement in R inferior breast pain and swelling to allow improved comfort and to allow pt to receive radiation    Baseline 07/10/20- 25% improvement    Time 4    Period Weeks    Status On-going      PT LONG TERM GOAL #4   Title Pt will demonstrate 170 degrees of right shoulder flexion and abduction to allow her to return to prior level of function    Baseline flexion 150, abduction 158; 07/10/20- flexion 170, abduction 179    Time 4     Period Weeks    Status Achieved      PT LONG TERM GOAL #5   Title There will be a 50% improvement in scar tissue to allow improved comfort.    Time 4    Period Weeks    Status New    Target Date 08/07/20                 Plan - 07/19/20 1706    Clinical Impression Statement Pt is doing well with MLD at home and has less edema in right chest after treatment today.  She has mild redness at incision and so foam was given to protect this area from irritation from bra band.  She would like to learn strengthening next session and then schedule an appointment a few weeks after that to monitor edema and exercise performance    Personal Factors and Comorbidities Comorbidity 2    Comorbidities Rt shoulder pain currently, lymph node removal previously    Stability/Clinical Decision Making Stable/Uncomplicated    Rehab Potential Excellent    PT Frequency 2x / week    PT Treatment/Interventions ADLs/Self Care Home Management;Patient/family education;Manual techniques;Therapeutic exercise;Manual lymph drainage;Compression bandaging;Taping;Scar mobilization;Passive range of motion;Vasopneumatic Device    PT Next Visit Plan review supine scap teach strength ABC program continue with monitoring of right chest with MLD or manual techniques as needed schedule an appt a few weeks out to monitor lymphedema    PT Home Exercise Plan post op breast; self MLD, supine scapular series, lying over foam roll           Patient will benefit from skilled therapeutic intervention in order to improve the following deficits and impairments:  Postural dysfunction, Pain, Increased edema, Decreased scar mobility, Increased fascial restricitons  Visit Diagnosis: Localized edema  Stiffness of right shoulder, not elsewhere classified  Abnormal posture  Muscle weakness (generalized)  Chronic right shoulder pain     Problem List Patient Active Problem List   Diagnosis Date Noted  . PAD (peripheral artery  disease) ([place]) 07/09/2020  . Carpal tunnel syndrome 03/09/2020  . Genetic testing 02/29/2020  . Family history of breast cancer   . Family history of ovarian cancer   . Family history of colon cancer   . Family history of prostate  cancer   . Family history of skin cancer   . Family history of leukemia   . Malignant neoplasm of lower-outer quadrant of right breast of female, estrogen receptor positive ([place]) 02/02/2020  . Osteopenia 01/25/2020  . B12 deficiency 01/04/2020  . Hearing loss 08/18/2018  . Hyperlipidemia 11/28/2016  . Anxiety and depression 11/28/2016  . Chronic fatigue 11/28/2016  . Recurrent genital herpes 11/28/2016  . Melanoma of skin, site unspecified 06/26/2013   Donato Heinz. Owens Shark PT  [facility] Levo 07/19/2020, 5:11 PM  [place], [place], [address] Phone: [phone]   Fax:  [phone]  Name: Temple Ewart MRN: [id number] Date of Birth: [birth year]

## 2020-07-23 ENCOUNTER — Other Ambulatory Visit: Payer: Self-pay

## 2020-07-23 ENCOUNTER — Encounter: Payer: Self-pay | Admitting: Physical Therapy

## 2020-07-23 ENCOUNTER — Telehealth: Payer: Self-pay | Admitting: Radiation Oncology

## 2020-07-23 ENCOUNTER — Ambulatory Visit: Payer: BC Managed Care – PPO | Admitting: Physical Therapy

## 2020-07-23 DIAGNOSIS — G8929 Other chronic pain: Secondary | ICD-10-CM

## 2020-07-23 DIAGNOSIS — M25611 Stiffness of right shoulder, not elsewhere classified: Secondary | ICD-10-CM

## 2020-07-23 DIAGNOSIS — R600 Localized edema: Secondary | ICD-10-CM | POA: Diagnosis not present

## 2020-07-23 DIAGNOSIS — R293 Abnormal posture: Secondary | ICD-10-CM

## 2020-07-23 NOTE — Telephone Encounter (Signed)
  Radiation Oncology         [phone]) [phone] ________________________________  Name: Jean Morgan MRN: [id number]  Date of Service: 07/23/2020  DOB: May 14, 1961  Post Treatment Telephone Note  Diagnosis:   Stage IB, pT2N0M0 grade 2, ER/PR positive invasive ductal carcinoma of the right breast  Interval Since Last Radiation:  5 weeks   05/28/20 - 06/22/20:  The patient initially received a dose of 42.56 Gy in 16 fractions to the right breast using whole-breast tangent fields. This was delivered using a 3-D conformal technique. The patient then received a boost to the seroma. This delivered an additional 8 Gy in 4 fractions using an en face electron field due to the depth of the seroma. The total dose was 50.56Gy.  Narrative:  The patient was contacted today for routine follow-up. During treatment she did very well with radiotherapy and did not have significant desquamation. She reports she is doing well. She does have som shooting pains in the breast but reports she is overall doing pretty well and sees [name] soon. She has been using triamcinolone for itchy dry skin in the treatment area of the right breast and this has helped her itching. She has been back to see Dr. Burr Medico and has started antiestrogen therapy as well.  Impression/Plan: 1. Stage IB, pT2N0M0 grade 2, ER/PR positive invasive ductal carcinoma of the right breast. The patient has been doing well since completion of radiotherapy. We discussed that we would be happy to continue to follow her as needed, but she will also continue to follow up with Dr. Burr Medico in medical oncology. She was counseled on skin care as well as measures to avoid sun exposure to this area.  2. Survivorship. We discussed the importance of survivorship evaluation and encouraged her to attend her upcoming visit with that clinic.    Carola Rhine, PAC

## 2020-07-23 NOTE — Therapy (Signed)
[place], [place], [address] Phone: [phone]   Fax:  [phone]  Physical Therapy Treatment  Patient Details  Name: Jean Morgan MRN: [id number] Date of Birth: Feb 06, 1961 Referring Provider (PT): [name]   Encounter Date: 07/23/2020   PT End of Session - 07/23/20 1250    Visit Number 15    Number of Visits 20    Date for PT Re-Evaluation 08/07/20    PT Start Time 1000    PT Stop Time 1045    PT Time Calculation (min) 45 min    Activity Tolerance Patient tolerated treatment well    Behavior During Therapy [facility] for tasks assessed/performed           Past Medical History:  Diagnosis Date  . Anxiety   . Cancer ([place])    skin  . Depression   . Family history of adverse reaction to anesthesia    brother had severe agitation coming out of anesthesia, had to be re-intubated  . Family history of breast cancer   . Family history of colon cancer   . Family history of leukemia   . Family history of ovarian cancer   . Family history of prostate cancer   . Family history of skin cancer   . Heart murmur    "slight"  . Hypothyroidism   . Optic neuritis   . 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 BREAST LUMPECTOMY WITH RADIOACTIVE SEED AND SENTINEL LYMPH NODE BIOPSY Right 04/19/2020   Procedure: RIGHT BREAST LUMPECTOMY WITH RADIOACTIVE SEED AND SENTINEL LYMPH NODE BIOPSY;  Surgeon: [name], MD;  Location: [facility];  Service: General;  Laterality: Right;  PEC BLOCK  . DENTAL SURGERY    . MELANOMA EXCISION  2006   Right forearm  . SENTINEL NODE BIOPSY Right 2006  . TONSILLECTOMY    . TONSILLECTOMY      There were no vitals filed for this visit.   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 Radiation completed   has started anastrozole    Patient Stated Goals to get rid of the tightness in her breast    Currently in Pain? No/denies                             [facility] Adult PT Treatment/Exercise - 07/23/20 0001      Exercises   Exercises Shoulder      Shoulder Exercises: Seated   Horizontal ABduction Strengthening;Right;Left;5 reps;Theraband   cues to sit tall and keep abdominal engaged    Theraband Level (Shoulder Horizontal ABduction) Level 2 (Red)    External Rotation Strengthening;Right;Left;5 reps;Theraband    Theraband Level (Shoulder External Rotation) Level 2 (Red)    Flexion Strengthening;Right;Left;5 reps;Theraband   wide and narrow grip    Theraband Level (Shoulder Flexion) Level 2 (Red)    Diagonals Strengthening;Right;Left;5 reps;Theraband    Theraband Level (Shoulder Diagonals) Level 2 (Red)  Manual Therapy   Manual Therapy Manual Lymphatic Drainage (MLD)    Manual Lymphatic Drainage (MLD) In supine: short neck, superficial and deep abdominals, right inguinal nodes and establishment of axillo inguinal pathway, Lt axillary  nodes and establishment of interaxillary pathway, R breast with focus on inferior breast in area of fibrosis moving fluid towards pathways finished retracing steps in supine redirecting fluid to pathways and nodes                       PT Long Term Goals - 07/10/20 1510      PT LONG TERM GOAL #1   Title Pt will be independent in a home exercise program for continued strengthening and stretching.    Time 4    Period Weeks    Status On-going      PT LONG TERM GOAL #2   Title Pt will report a 75% improvement in R neck and upper shoulder pain to  allow improved comfort    Baseline 07/10/20- 75% improvement    Time 4    Period Weeks    Status Achieved      PT LONG TERM GOAL #3   Title Pt will report a 75% improvement in R inferior breast pain and swelling to allow improved comfort and to allow pt to receive radiation    Baseline 07/10/20- 25% improvement    Time 4    Period Weeks    Status On-going      PT LONG TERM GOAL #4   Title Pt will demonstrate 170 degrees of right shoulder flexion and abduction to allow her to return to prior level of function    Baseline flexion 150, abduction 158; 07/10/20- flexion 170, abduction 179    Time 4    Period Weeks    Status Achieved      PT LONG TERM GOAL #5   Title There will be a 50% improvement in scar tissue to allow improved comfort.    Time 4    Period Weeks    Status New    Target Date 08/07/20                 Plan - 07/23/20 1255    Clinical Impression Statement Pt has good ROM and feels like she is doing her stretching exercises.  She would be willing to do the band exercises to increase her strenth so these were reviewed in the sitting position with a red band before MLD was performed.  At the end of session, she said she noticed some soreness in her right shoulder, so she was advised to go down to the yelllow band after a rest with light stretching tomorrow.  Scar and fibrosis appear improved today.  Pt will continue with second treatment this week , then decrease to one time a week for few weeks to see how she does with exercise, self MLD and compression    Personal Factors and Comorbidities Comorbidity 2    Comorbidities Rt shoulder pain currently, lymph node removal previously    Stability/Clinical Decision Making Stable/Uncomplicated    Rehab Potential Excellent    PT Frequency 2x / week    PT Treatment/Interventions ADLs/Self Care Home Management;Patient/family education;Manual techniques;Therapeutic exercise;Manual lymph drainage;Compression bandaging;Taping;Scar  mobilization;Passive range of motion;Vasopneumatic Device    PT Next Visit Plan check for right shoulder soreness and manage as needed  continue with monitoring of right chest with MLD or manual techniques as needed schedule an appt a few  weeks out to monitor lymphedema    PT Home Exercise Plan post op breast; self MLD, supine scapular series, lying over foam roll    Consulted and Agree with Plan of Care Patient           Patient will benefit from skilled therapeutic intervention in order to improve the following deficits and impairments:  Postural dysfunction, Pain, Increased edema, Decreased scar mobility, Increased fascial restricitons  Visit Diagnosis: Localized edema  Stiffness of right shoulder, not elsewhere classified  Abnormal posture  Chronic right shoulder pain     Problem List Patient Active Problem List   Diagnosis Date Noted  . PAD (peripheral artery disease) ([place]) 07/09/2020  . Carpal tunnel syndrome 03/09/2020  . Genetic testing 02/29/2020  . Family history of breast cancer   . Family history of ovarian cancer   . Family history of colon cancer   . Family history of prostate cancer   . Family history of skin cancer   . Family history of leukemia   . Malignant neoplasm of lower-outer quadrant of right breast of female, estrogen receptor positive ([place]) 02/02/2020  . Osteopenia 01/25/2020  . B12 deficiency 01/04/2020  . Hearing loss 08/18/2018  . Hyperlipidemia 11/28/2016  . Anxiety and depression 11/28/2016  . Chronic fatigue 11/28/2016  . Recurrent genital herpes 11/28/2016  . Melanoma of skin, site unspecified 06/26/2013   Donato Heinz. Owens Shark PT  [place] Levo 07/23/2020, 12:59 PM  [place], [place], [address] Phone: [phone]   Fax:  [phone]  Name: Jean Morgan MRN: [id number] Date of Birth: 1961/01/03

## 2020-07-23 NOTE — Patient Instructions (Signed)

## 2020-07-25 ENCOUNTER — Encounter: Payer: Self-pay | Admitting: Physical Therapy

## 2020-07-25 ENCOUNTER — Other Ambulatory Visit: Payer: Self-pay

## 2020-07-25 ENCOUNTER — Ambulatory Visit: Payer: BC Managed Care – PPO | Admitting: Physical Therapy

## 2020-07-25 DIAGNOSIS — R600 Localized edema: Secondary | ICD-10-CM | POA: Diagnosis not present

## 2020-07-25 DIAGNOSIS — M25611 Stiffness of right shoulder, not elsewhere classified: Secondary | ICD-10-CM

## 2020-07-25 NOTE — Therapy (Signed)
[place], [place], [address] Phone: [phone]   Fax:  [phone]  Physical Therapy Treatment  Patient Details  Name: Jean Morgan MRN: [id number] Date of Birth: 20-Aug-1961 Referring Provider (PT): [name]   Encounter Date: 07/25/2020   PT End of Session - 07/25/20 0958    Visit Number 16    Number of Visits 20    Date for PT Re-Evaluation 08/07/20    PT Start Time 0902    PT Stop Time 0958    PT Time Calculation (min) 56 min    Activity Tolerance Patient tolerated treatment well    Behavior During Therapy [facility] for tasks assessed/performed           Past Medical History:  Diagnosis Date  . Anxiety   . Cancer ([place])    skin  . Depression   . Family history of adverse reaction to anesthesia    brother had severe agitation coming out of anesthesia, had to be re-intubated  . Family history of breast cancer   . Family history of colon cancer   . Family history of leukemia   . Family history of ovarian cancer   . Family history of prostate cancer   . Family history of skin cancer   . Heart murmur    "slight"  . Hypothyroidism   . Optic neuritis   . 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 BREAST LUMPECTOMY WITH RADIOACTIVE SEED AND SENTINEL LYMPH NODE BIOPSY Right 04/19/2020   Procedure: RIGHT BREAST LUMPECTOMY WITH RADIOACTIVE SEED AND SENTINEL LYMPH NODE BIOPSY;  Surgeon: [name], MD;  Location: [place];  Service: General;  Laterality: Right;  PEC BLOCK  . DENTAL SURGERY    . MELANOMA EXCISION  2006   Right forearm  . SENTINEL NODE BIOPSY Right 2006  . TONSILLECTOMY    . TONSILLECTOMY      There were no vitals filed for this visit.   Subjective Assessment - 07/25/20 0904    Subjective We did not exercises with the band the other day and I did not hurt. I am just a little still across the back of my shoulders.     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 Radiation completed   has started anastrozole    Patient Stated Goals to get rid of the tightness in her breast    Currently in Pain? No/denies    Pain Score 0-No pain                             [facility] Adult PT Treatment/Exercise - 07/25/20 0001      Manual Therapy   Soft tissue mobilization along areas of fibrosis throughout R breast and along lumpectomy scar - significant improvement noted by end of session    Manual Lymphatic Drainage (MLD) In supine: short neck, superficial and deep abdominals, right inguinal nodes and establishment of axillo inguinal pathway, Lt axillary  nodes and establishment of interaxillary pathway, R breast with focus on inferior breast in area of fibrosis moving fluid towards pathways finished retracing steps in supine redirecting fluid to pathways and nodes  PT Long Term Goals - 07/10/20 1510      PT LONG TERM GOAL #1   Title Pt will be independent in a home exercise program for continued strengthening and stretching.    Time 4    Period Weeks    Status On-going      PT LONG TERM GOAL #2   Title Pt will report a 75% improvement in R neck and upper shoulder pain to allow improved comfort    Baseline 07/10/20- 75% improvement    Time 4    Period Weeks    Status Achieved      PT LONG TERM GOAL #3   Title Pt will report a 75% improvement in R inferior breast pain and swelling to allow improved comfort and to allow pt to receive radiation    Baseline 07/10/20- 25% improvement    Time 4    Period Weeks    Status On-going      PT LONG TERM GOAL #4   Title Pt will demonstrate 170 degrees of right shoulder flexion and abduction to allow her to return to prior level of function     Baseline flexion 150, abduction 158; 07/10/20- flexion 170, abduction 179    Time 4    Period Weeks    Status Achieved      PT LONG TERM GOAL #5   Title There will be a 50% improvement in scar tissue to allow improved comfort.    Time 4    Period Weeks    Status New    Target Date 08/07/20                 Plan - 07/25/20 0958    Clinical Impression Statement Continued with manual therapy to right breast to reduce scar tissue and decrease fluid. Pt obtained compression bras and feel they have been helping. Numerous fibrotic areas palpable in inferior breast that improved with treatment today. Encouraged pt to work on these areas at home and educated her in massage technique to help decrease fibrosis.    PT Frequency 2x / week    PT Duration 4 weeks    PT Treatment/Interventions ADLs/Self Care Home Management;Patient/family education;Manual techniques;Therapeutic exercise;Manual lymph drainage;Compression bandaging;Taping;Scar mobilization;Passive range of motion;Vasopneumatic Device    PT Next Visit Plan check for right shoulder soreness and manage as needed  continue with monitoring of right chest with MLD or manual techniques as needed schedule an appt a few weeks out to monitor lymphedema    PT Home Exercise Plan post op breast; self MLD, supine scapular series, lying over foam roll    Consulted and Agree with Plan of Care Patient           Patient will benefit from skilled therapeutic intervention in order to improve the following deficits and impairments:  Postural dysfunction, Pain, Increased edema, Decreased scar mobility, Increased fascial restricitons  Visit Diagnosis: Localized edema  Stiffness of right shoulder, not elsewhere classified     Problem List Patient Active Problem List   Diagnosis Date Noted  . PAD (peripheral artery disease) ([place]) 07/09/2020  . Carpal tunnel syndrome 03/09/2020  . Genetic testing 02/29/2020  . Family history of breast cancer    . Family history of ovarian cancer   . Family history of colon cancer   . Family history of prostate cancer   . Family history of skin cancer   . Family history of leukemia   . Malignant neoplasm of lower-outer quadrant of right breast of  female, estrogen receptor positive ([place]) 02/02/2020  . Osteopenia 01/25/2020  . B12 deficiency 01/04/2020  . Hearing loss 08/18/2018  . Hyperlipidemia 11/28/2016  . Anxiety and depression 11/28/2016  . Chronic fatigue 11/28/2016  . Recurrent genital herpes 11/28/2016  . Melanoma of skin, site unspecified 06/26/2013    Allyson Sabal [facility] 07/25/2020, 10:00 AM  [place], [place], [address] Phone: [phone]   Fax:  [phone]  Name: Shelisha Gautier MRN: [id number] Date of Birth: 29-May-1961  Manus Gunning, PT 07/25/20 10:00 AM

## 2020-07-26 ENCOUNTER — Other Ambulatory Visit: Payer: Self-pay | Admitting: Hematology

## 2020-07-29 ENCOUNTER — Other Ambulatory Visit: Payer: Self-pay | Admitting: Family Medicine

## 2020-07-29 DIAGNOSIS — A6000 Herpesviral infection of urogenital system, unspecified: Secondary | ICD-10-CM

## 2020-07-30 ENCOUNTER — Encounter: Payer: BC Managed Care – PPO | Admitting: Physical Therapy

## 2020-07-31 ENCOUNTER — Encounter (INDEPENDENT_AMBULATORY_CARE_PROVIDER_SITE_OTHER): Payer: Self-pay | Admitting: Otolaryngology

## 2020-07-31 ENCOUNTER — Ambulatory Visit (INDEPENDENT_AMBULATORY_CARE_PROVIDER_SITE_OTHER): Payer: BC Managed Care – PPO | Admitting: Otolaryngology

## 2020-07-31 ENCOUNTER — Other Ambulatory Visit: Payer: Self-pay

## 2020-07-31 VITALS — Temp 97.9°F

## 2020-07-31 DIAGNOSIS — J310 Chronic rhinitis: Secondary | ICD-10-CM | POA: Diagnosis not present

## 2020-07-31 DIAGNOSIS — H903 Sensorineural hearing loss, bilateral: Secondary | ICD-10-CM | POA: Diagnosis not present

## 2020-07-31 NOTE — Progress Notes (Signed)
HPI: Jean Morgan is a 59 y.o. female who presents for evaluation of complaints of hearing loss.  She is also has some itching in the left ear more in the pinna region.  She has had no drainage from the ear.  She also complains of intermittent sinus issues with nasal drainage but does not have that much trouble breathing.  She presents today to get a hearing test and recommendations concerning her sinuses.  Past Medical History:  Diagnosis Date   Anxiety    Cancer ([place])    skin   Depression    Family history of adverse reaction to anesthesia    brother had severe agitation coming out of anesthesia, had to be re-intubated   Family history of breast cancer    Family history of colon cancer    Family history of leukemia    Family history of ovarian cancer    Family history of prostate cancer    Family history of skin cancer    Heart murmur    "slight"   Hypothyroidism    Optic neuritis    [place] spotted fever    1970's   Past Surgical History:  Procedure Laterality Date   BREAST LUMPECTOMY WITH RADIOACTIVE SEED AND SENTINEL LYMPH NODE BIOPSY Right 04/19/2020   Procedure: RIGHT BREAST LUMPECTOMY WITH RADIOACTIVE SEED AND SENTINEL LYMPH NODE BIOPSY;  Surgeon: [name], MD;  Location: [place];  Service: General;  Laterality: Right;  [place]  2006   Right forearm   SENTINEL NODE BIOPSY Right 2006   TONSILLECTOMY     TONSILLECTOMY     Social History   Socioeconomic History   Marital status: Married    Spouse name: Not on file   Number of children: 1   Years of education: Not on file   Highest education level: Not on file  Occupational History   Not on file  Tobacco Use   Smoking status: Never Smoker   Smokeless tobacco: Never Used  Vaping Use   Vaping Use: Never used  Substance and Sexual Activity   Alcohol use: Yes    Alcohol/week: 7.0 standard drinks    Types: 7 Glasses of wine per week    Drug use: No   Sexual activity: Not on file  Other Topics Concern   Not on file  Social History Narrative   Not on file   Social Determinants of Health   Financial Resource Strain:    Difficulty of Paying Living Expenses: Not on file  Food Insecurity:    Worried About [place] in the Last Year: Not on file   Ran Out of Food in the Last Year: Not on file  Transportation Needs:    Lack of Transportation (Medical): Not on file   Lack of Transportation (Non-Medical): Not on file  Physical Activity:    Days of Exercise per Week: Not on file   Minutes of Exercise per Session: Not on file  Stress:    Feeling of Stress : Not on file  Social Connections:    Frequency of Communication with Friends and Family: Not on file   Frequency of Social Gatherings with Friends and Family: Not on file   Attends Religious Services: Not on file   Active Member of Clubs or Organizations: Not on file   Attends Archivist Meetings: Not on file   Marital Status: Not on file   Family History  Problem Relation Age of Onset   Colon polyps Mother        more than 75   Colitis Mother    Arthritis Mother    Hyperlipidemia Mother    Diabetes Mother    Skin cancer Mother    Heart disease Father    Hyperlipidemia Father    Hypertension Father    Colon cancer Paternal Aunt        dx. in her 52s   Diabetes Maternal Grandmother    Breast cancer Maternal Grandmother        dx. >50   Breast cancer Other 79       maternal great-aunt   Ovarian cancer Other 53   Colon cancer Other 61   Skin cancer Other 53   Prostate cancer Half-Brother 76   Leukemia Maternal Great-grandmother 50       chronic   Allergies  Allergen Reactions   Latex Itching   Oxycodone Other (See Comments)    Other Reaction: "too strong a drug for me"   Prior to Admission medications   Medication Sig Start Date End Date Taking? Authorizing Provider  anastrozole  (ARIMIDEX) 1 MG tablet TAKE 1 TABLET BY MOUTH EVERY DAY 07/26/20  Yes Truitt Merle, MD  Calcium-Vitamin D-Vitamin K [id number] MG-UNT-MCG CHEW Chew 2 each by mouth daily.   Yes [provider]  cetirizine (ZYRTEC) 5 MG tablet Take 5 mg by mouth daily as needed for allergies.   Yes [provider]  citalopram (CELEXA) 20 MG tablet Take 1 tablet (20 mg total) by mouth daily. 05/29/20  Yes [name], [name], MD  diazepam (VALIUM) 5 MG tablet Take 0.5-1 tablets (2.5-5 mg total) by mouth every 12 (twelve) hours as needed for anxiety. Take 1-2 tabs 15 min before procedures. 05/06/20  Yes [name], [name], MD  HYDROcodone-acetaminophen (NORCO/VICODIN) 5-325 MG tablet Take 1-2 tablets by mouth every 6 (six) hours as needed for moderate pain or severe pain. 04/19/20  Yes Autumn Messing III, MD  valACYclovir (VALTREX) 1000 MG tablet TAKE 1 TABLET ONCE DAILY FOR 5 DAYS AS NEEDED, START ASAP WITHIN 48 HOURS OF ONSET OF SYMPTOMS 07/30/20  Yes [name], [name], MD     Positive ROS: Otherwise negative  All other systems have been reviewed and were otherwise negative with the exception of those mentioned in the HPI and as above.  Physical Exam: Constitutional: Alert, well-appearing, no acute distress Ears: External ears without lesions or tenderness. Ear canals are clear bilaterally with intact, clear TMs.  Nasal: External nose without lesions. Septum with mild deformity and clear mucus discharge.  Both middle meatus regions were clear.  No mucopurulent discharge noted and no polyps noted..  Oral: Lips and gums without lesions. Tongue and palate mucosa without lesions. Posterior oropharynx clear.  Patient is status post tonsillectomy. Neck: No palpable adenopathy or masses Respiratory: Breathing comfortably  Skin: No facial/neck lesions or rash noted  Audiologic testing demonstrated mild to moderate downsloping sensorineural hearing loss in both ears which was symmetric.  SRT's were 30 dB bilaterally.  She  had type A tympanograms bilaterally..  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  Also discussed with her concerning using saline irrigations as needed. Concerning her hearing loss I reviewed the audiogram whether it would recommend consideration of use of hearing  aids as she has a mild-moderate hearing loss in both ears  Radene Journey, MD

## 2020-08-01 ENCOUNTER — Encounter: Payer: Self-pay | Admitting: Physical Therapy

## 2020-08-01 ENCOUNTER — Other Ambulatory Visit: Payer: Self-pay

## 2020-08-01 ENCOUNTER — Ambulatory Visit: Payer: BC Managed Care – PPO | Attending: General Surgery | Admitting: Physical Therapy

## 2020-08-01 DIAGNOSIS — R293 Abnormal posture: Secondary | ICD-10-CM | POA: Diagnosis present

## 2020-08-01 DIAGNOSIS — M25511 Pain in right shoulder: Secondary | ICD-10-CM | POA: Diagnosis present

## 2020-08-01 DIAGNOSIS — G8929 Other chronic pain: Secondary | ICD-10-CM | POA: Diagnosis present

## 2020-08-01 DIAGNOSIS — M6281 Muscle weakness (generalized): Secondary | ICD-10-CM | POA: Diagnosis present

## 2020-08-01 DIAGNOSIS — M25611 Stiffness of right shoulder, not elsewhere classified: Secondary | ICD-10-CM | POA: Diagnosis present

## 2020-08-01 DIAGNOSIS — M542 Cervicalgia: Secondary | ICD-10-CM | POA: Insufficient documentation

## 2020-08-01 DIAGNOSIS — R600 Localized edema: Secondary | ICD-10-CM | POA: Diagnosis present

## 2020-08-01 NOTE — Therapy (Signed)
[place], [place], [address] Phone: [phone]   Fax:  [phone]  Physical Therapy Treatment  Patient Details  Name: Jean Morgan MRN: [id number] Date of Birth: [birth year] Referring Provider (PT): [name]   Encounter Date: 08/01/2020   PT End of Session - 08/01/20 1354    Visit Number 17    Number of Visits 20    Date for PT Re-Evaluation 08/07/20    PT Start Time 9233    PT Stop Time 1354    PT Time Calculation (min) 49 min    Activity Tolerance Patient tolerated treatment well    Behavior During Therapy [facility] for tasks assessed/performed           Past Medical History:  Diagnosis Date  . Anxiety   . Cancer ([place])    skin  . Depression   . Family history of adverse reaction to anesthesia    brother had severe agitation coming out of anesthesia, had to be re-intubated  . Family history of breast cancer   . Family history of colon cancer   . Family history of leukemia   . Family history of ovarian cancer   . Family history of prostate cancer   . Family history of skin cancer   . Heart murmur    "slight"  . Hypothyroidism   . Optic neuritis   . 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 BREAST LUMPECTOMY WITH RADIOACTIVE SEED AND SENTINEL LYMPH NODE BIOPSY Right 04/19/2020   Procedure: RIGHT BREAST LUMPECTOMY WITH RADIOACTIVE SEED AND SENTINEL LYMPH NODE BIOPSY;  Surgeon: [name], MD;  Location: [place];  Service: General;  Laterality: Right;  PEC BLOCK  . DENTAL SURGERY    . MELANOMA EXCISION  2006   Right forearm  . SENTINEL NODE BIOPSY Right 2006  . TONSILLECTOMY    . TONSILLECTOMY      There were no vitals filed for this visit.   Subjective Assessment - 08/01/20 1307    Subjective Those hard places in my breast seem more hard at night. There are still a couple of places in my breast that hurt.    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 Radiation completed   has started anastrozole    Patient Stated Goals to get rid of the tightness in her breast    Currently in Pain? Yes    Pain Score 2     Pain Location Axilla    Pain Orientation Anterior    Pain Descriptors / Indicators Aching    Pain Type Acute pain    Pain Radiating Towards neck    Pain Onset More than a month ago    Pain Frequency Intermittent                             [facility] Adult PT Treatment/Exercise - 08/01/20 0001      Manual Therapy   Soft tissue mobilization along areas of fibrosis throughout R breast and along lumpectomy scar - significant improvement noted by end of session    Manual Lymphatic Drainage (MLD) In supine: short neck, superficial and deep abdominals, right inguinal nodes and establishment of axillo  inguinal pathway, Lt axillary  nodes and establishment of interaxillary pathway, R breast with focus on inferior breast in area of fibrosis moving fluid towards pathways finished retracing steps in supine redirecting fluid to pathways and nodes                       PT Long Term Goals - 07/10/20 1510      PT LONG TERM GOAL #1   Title Pt will be independent in a home exercise program for continued strengthening and stretching.    Time 4    Period Weeks    Status On-going      PT LONG TERM GOAL #2   Title Pt will report a 75% improvement in R neck and upper shoulder pain to allow improved comfort    Baseline 07/10/20- 75% improvement    Time 4    Period Weeks    Status Achieved      PT LONG TERM GOAL #3   Title Pt will report a 75% improvement in R inferior breast pain and swelling to allow improved comfort and to allow pt to receive radiation    Baseline 07/10/20- 25% improvement    Time 4     Period Weeks    Status On-going      PT LONG TERM GOAL #4   Title Pt will demonstrate 170 degrees of right shoulder flexion and abduction to allow her to return to prior level of function    Baseline flexion 150, abduction 158; 07/10/20- flexion 170, abduction 179    Time 4    Period Weeks    Status Achieved      PT LONG TERM GOAL #5   Title There will be a 50% improvement in scar tissue to allow improved comfort.    Time 4    Period Weeks    Status New    Target Date 08/07/20                 Plan - 08/01/20 1355    Clinical Impression Statement Pt continues to demonstrate scar tissue/fibrosis in inferior and medial breast. Continued with manual techniques and MLD to help soften this with signficant improvement noted by end of session. Pt reports she has been trying to do this at night for at least 30 minutes    PT Frequency 2x / week    PT Duration 4 weeks    PT Treatment/Interventions ADLs/Self Care Home Management;Patient/family education;Manual techniques;Therapeutic exercise;Manual lymph drainage;Compression bandaging;Taping;Scar mobilization;Passive range of motion;Vasopneumatic Device    PT Next Visit Plan check for right shoulder soreness and manage as needed  continue with monitoring of right chest with MLD or manual techniques as needed schedule an appt a few weeks out to monitor lymphedema    PT Home Exercise Plan post op breast; self MLD, supine scapular series, lying over foam roll    Consulted and Agree with Plan of Care Patient           Patient will benefit from skilled therapeutic intervention in order to improve the following deficits and impairments:  Postural dysfunction, Pain, Increased edema, Decreased scar mobility, Increased fascial restricitons  Visit Diagnosis: Localized edema  Abnormal posture     Problem List Patient Active Problem List   Diagnosis Date Noted  . PAD (peripheral artery disease) ([place]) 07/09/2020  . Carpal tunnel syndrome  03/09/2020  . Genetic testing 02/29/2020  . Family history of breast cancer   . Family history of ovarian  cancer   . Family history of colon cancer   . Family history of prostate cancer   . Family history of skin cancer   . Family history of leukemia   . Malignant neoplasm of lower-outer quadrant of right breast of female, estrogen receptor positive ([place]) 02/02/2020  . Osteopenia 01/25/2020  . B12 deficiency 01/04/2020  . Hearing loss 08/18/2018  . Hyperlipidemia 11/28/2016  . Anxiety and depression 11/28/2016  . Chronic fatigue 11/28/2016  . Recurrent genital herpes 11/28/2016  . Melanoma of skin, site unspecified 06/26/2013    Allyson Sabal [name] 08/01/2020, 1:57 PM  [place], [place], [address] Phone: [phone]   Fax:  [phone]  Name: Jean Morgan MRN: [id number] Date of Birth: [birth year]  Manus Gunning, PT 08/01/20 1:57 PM

## 2020-08-02 ENCOUNTER — Encounter (INDEPENDENT_AMBULATORY_CARE_PROVIDER_SITE_OTHER): Payer: Self-pay

## 2020-08-06 ENCOUNTER — Encounter: Payer: Self-pay | Admitting: Physical Therapy

## 2020-08-06 ENCOUNTER — Ambulatory Visit: Payer: BC Managed Care – PPO | Admitting: Physical Therapy

## 2020-08-06 ENCOUNTER — Other Ambulatory Visit: Payer: Self-pay

## 2020-08-06 DIAGNOSIS — M25611 Stiffness of right shoulder, not elsewhere classified: Secondary | ICD-10-CM

## 2020-08-06 DIAGNOSIS — R600 Localized edema: Secondary | ICD-10-CM | POA: Diagnosis not present

## 2020-08-06 DIAGNOSIS — R293 Abnormal posture: Secondary | ICD-10-CM

## 2020-08-06 NOTE — Therapy (Signed)
[place], [place], [address] Phone: [phone]   Fax:  [phone]  Physical Therapy Treatment  Patient Details  Name: Jean Morgan MRN: [id number] Date of Birth: 11/13/1960 Referring Provider (PT): [name]   Encounter Date: 08/06/2020   PT End of Session - 08/06/20 1555    Visit Number 18    Number of Visits 20    Date for PT Re-Evaluation 08/07/20    PT Start Time 7564    PT Stop Time 1554    PT Time Calculation (min) 47 min    Activity Tolerance Patient tolerated treatment well    Behavior During Therapy [facility] for tasks assessed/performed           Past Medical History:  Diagnosis Date  . Anxiety   . Cancer ([place])    skin  . Depression   . Family history of adverse reaction to anesthesia    brother had severe agitation coming out of anesthesia, had to be re-intubated  . Family history of breast cancer   . Family history of colon cancer   . Family history of leukemia   . Family history of ovarian cancer   . Family history of prostate cancer   . Family history of skin cancer   . Heart murmur    "slight"  . Hypothyroidism   . Optic neuritis   . 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 BREAST LUMPECTOMY WITH RADIOACTIVE SEED AND SENTINEL LYMPH NODE BIOPSY Right 04/19/2020   Procedure: RIGHT BREAST LUMPECTOMY WITH RADIOACTIVE SEED AND SENTINEL LYMPH NODE BIOPSY;  Surgeon: [name], MD;  Location: [facility];  Service: General;  Laterality: Right;  PEC BLOCK  . DENTAL SURGERY    . MELANOMA EXCISION  2006   Right forearm  . SENTINEL NODE BIOPSY Right 2006  . TONSILLECTOMY    . TONSILLECTOMY      There were no vitals filed for this visit.   Subjective Assessment - 08/06/20 1508    Subjective The breast is the same. It hurts some. I feel like there is a lump in the side of the breast.    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 Radiation completed   has started anastrozole    Patient Stated Goals to get rid of the tightness in her breast    Currently in Pain? Yes    Pain Score 2     Pain Location Axilla    Pain Orientation Anterior    Pain Descriptors / Indicators Aching    Pain Type Acute pain    Pain Onset More than a month ago    Pain Frequency Intermittent    Aggravating Factors  raising arm up all the way                             [facility] Adult PT Treatment/Exercise - 08/06/20 0001      Manual Therapy   Manual therapy comments Leone Payor, PT & supervisor also looked at areas of hardness today and discussed with pt the difference between scar tissue and possible fluid, educated pt to follow up with her doctor regarding hard lump in inferior breast,  answered pt's questions regarding different areas of hardness in breast    Soft tissue mobilization along areas of fibrosis throughout R breast and along lumpectomy scar - significant improvement noted by end of session    Myofascial Release across right axilla to help decrease pec tightness                       PT Long Term Goals - 07/10/20 1510      PT LONG TERM GOAL #1   Title Pt will be independent in a home exercise program for continued strengthening and stretching.    Time 4    Period Weeks    Status On-going      PT LONG TERM GOAL #2   Title Pt will report a 75% improvement in R neck and upper shoulder pain to allow improved comfort    Baseline 07/10/20- 75% improvement    Time 4    Period Weeks    Status Achieved      PT LONG TERM GOAL #3   Title Pt will report a 75% improvement in R inferior breast pain and swelling to allow improved comfort and to allow pt to receive radiation    Baseline 07/10/20- 25%  improvement    Time 4    Period Weeks    Status On-going      PT LONG TERM GOAL #4   Title Pt will demonstrate 170 degrees of right shoulder flexion and abduction to allow her to return to prior level of function    Baseline flexion 150, abduction 158; 07/10/20- flexion 170, abduction 179    Time 4    Period Weeks    Status Achieved      PT LONG TERM GOAL #5   Title There will be a 50% improvement in scar tissue to allow improved comfort.    Time 4    Period Weeks    Status New    Target Date 08/07/20                 Plan - 08/06/20 1557    Clinical Impression Statement Pt continues to present with increased hardness in inferior breast. Pt is going to see [name] in 2 weeks. Educated pt to ask the doctor about this. It does soften with treatment like scar tissue should but returns in approximately 2 days. Continued with manual therapy to this area and by end of session it was more soft. Performed myofascial release across R axilla to help decrease pec tightness.    PT Frequency 2x / week    PT Duration 4 weeks    PT Treatment/Interventions ADLs/Self Care Home Management;Patient/family education;Manual techniques;Therapeutic exercise;Manual lymph drainage;Compression bandaging;Taping;Scar mobilization;Passive range of motion;Vasopneumatic Device    PT Next Visit Plan check for right shoulder soreness and manage as needed  continue with monitoring of right chest with MLD or manual techniques as needed schedule an appt a few weeks out to monitor lymphedema    PT Home Exercise Plan post op breast; self MLD, supine scapular series, lying over foam roll    Consulted and Agree with Plan of Care Patient           Patient will benefit from skilled therapeutic intervention in order to improve the following deficits and impairments:  Postural dysfunction, Pain, Increased edema, Decreased scar mobility, Increased fascial restricitons  Visit Diagnosis: Localized edema  Stiffness of  right shoulder, not elsewhere classified  Abnormal posture  Problem List Patient Active Problem List   Diagnosis Date Noted  . PAD (peripheral artery disease) ([place]) 07/09/2020  . Carpal tunnel syndrome 03/09/2020  . Genetic testing 02/29/2020  . Family history of breast cancer   . Family history of ovarian cancer   . Family history of colon cancer   . Family history of prostate cancer   . Family history of skin cancer   . Family history of leukemia   . Malignant neoplasm of lower-outer quadrant of right breast of female, estrogen receptor positive ([place]) 02/02/2020  . Osteopenia 01/25/2020  . B12 deficiency 01/04/2020  . Hearing loss 08/18/2018  . Hyperlipidemia 11/28/2016  . Anxiety and depression 11/28/2016  . Chronic fatigue 11/28/2016  . Recurrent genital herpes 11/28/2016  . Melanoma of skin, site unspecified 06/26/2013    Allyson Sabal [facility] 08/06/2020, 4:02 PM  [place], [place], [address] Phone: [phone]   Fax:  [phone]  Name: Layton Tappan MRN: [id number] Date of Birth: [birth year]  Manus Gunning, PT 08/06/20 4:02 PM

## 2020-08-13 ENCOUNTER — Ambulatory Visit: Payer: BC Managed Care – PPO | Admitting: Physical Therapy

## 2020-08-13 ENCOUNTER — Encounter: Payer: Self-pay | Admitting: Physical Therapy

## 2020-08-13 ENCOUNTER — Other Ambulatory Visit: Payer: Self-pay

## 2020-08-13 DIAGNOSIS — R600 Localized edema: Secondary | ICD-10-CM

## 2020-08-13 DIAGNOSIS — M542 Cervicalgia: Secondary | ICD-10-CM

## 2020-08-13 DIAGNOSIS — M6281 Muscle weakness (generalized): Secondary | ICD-10-CM

## 2020-08-13 DIAGNOSIS — M25611 Stiffness of right shoulder, not elsewhere classified: Secondary | ICD-10-CM

## 2020-08-13 DIAGNOSIS — G8929 Other chronic pain: Secondary | ICD-10-CM

## 2020-08-13 DIAGNOSIS — R293 Abnormal posture: Secondary | ICD-10-CM

## 2020-08-13 DIAGNOSIS — M25511 Pain in right shoulder: Secondary | ICD-10-CM

## 2020-08-13 NOTE — Therapy (Signed)
[place], [place], [address] Phone: [phone]   Fax:  [phone]  Physical Therapy Treatment  Patient Details  Name: Jean Morgan MRN: [id number] Date of Birth: [birth year] Referring Provider (PT): [name]   Encounter Date: 08/13/2020   PT End of Session - 08/13/20 1558    Visit Number 19    Number of Visits 24    Date for PT Re-Evaluation 09/10/20    PT Start Time 4765   pt arrived late   PT Stop Time 4650    PT Time Calculation (min) 46 min    Activity Tolerance Patient tolerated treatment well    Behavior During Therapy [facility] for tasks assessed/performed           Past Medical History:  Diagnosis Date   Anxiety    Cancer ([place])    skin   Depression    Family history of adverse reaction to anesthesia    brother had severe agitation coming out of anesthesia, had to be re-intubated   Family history of breast cancer    Family history of colon cancer    Family history of leukemia    Family history of ovarian cancer    Family history of prostate cancer    Family history of skin cancer    Heart murmur    "slight"   Hypothyroidism    Optic neuritis    [place] spotted fever    1970's    Past Surgical History:  Procedure Laterality Date   BREAST LUMPECTOMY WITH RADIOACTIVE SEED AND SENTINEL LYMPH NODE BIOPSY Right 04/19/2020   Procedure: RIGHT BREAST LUMPECTOMY WITH RADIOACTIVE SEED AND SENTINEL LYMPH NODE BIOPSY;  Surgeon: [name], MD;  Location: [place];  Service: General;  Laterality: Right;  [place]  2006   Right forearm   SENTINEL NODE BIOPSY Right 2006   TONSILLECTOMY     TONSILLECTOMY      There were no vitals filed for this visit.   Subjective Assessment - 08/13/20 1512    Subjective The hard places stayed gone for about 3 to 4 days after therapy.    Pertinent History Diagnosis of Rt stage IB ER/PR  positive and HER2 negative breast cancer, history of Rt melanoma removal with 1 lymph node removed, osteopenia, B12 deficiency, will be having lumpectomy and SLNB on 04/19/20 with [name] Radiation completed   has started anastrozole    Patient Stated Goals to get rid of the tightness in her breast    Currently in Pain? Yes    Pain Score 2     Pain Location Axilla    Pain Orientation Anterior    Pain Descriptors / Indicators Aching    Pain Type Acute pain                             [facility] Adult PT Treatment/Exercise - 08/13/20 0001      Manual Therapy   Soft tissue mobilization along areas of fibrosis throughout R breast and along lumpectomy scar - significant improvement noted by end of session    Myofascial Release across right axilla to help decrease pec tightness                       PT Long Term Goals - 07/10/20 1510      PT LONG TERM GOAL #  1   Title Pt will be independent in a home exercise program for continued strengthening and stretching.    Time 4    Period Weeks    Status On-going      PT LONG TERM GOAL #2   Title Pt will report a 75% improvement in R neck and upper shoulder pain to allow improved comfort    Baseline 07/10/20- 75% improvement    Time 4    Period Weeks    Status Achieved      PT LONG TERM GOAL #3   Title Pt will report a 75% improvement in R inferior breast pain and swelling to allow improved comfort and to allow pt to receive radiation    Baseline 07/10/20- 25% improvement    Time 4    Period Weeks    Status On-going      PT LONG TERM GOAL #4   Title Pt will demonstrate 170 degrees of right shoulder flexion and abduction to allow her to return to prior level of function    Baseline flexion 150, abduction 158; 07/10/20- flexion 170, abduction 179    Time 4    Period Weeks    Status Achieved      PT LONG TERM GOAL #5   Title There will be a 50% improvement in scar tissue to allow improved comfort.    Time 4     Period Weeks    Status New    Target Date 08/07/20                 Plan - 08/13/20 1600    Clinical Impression Statement Pt is going longer between sessions before the fibrotic areas in her breast return. She is still having increased tightness across R pec with pain when she pushes her arm to end range. Pt will see her doctor in a week and follow up on the fibrotic areas. Pt would benefit from a few more therapy sessions to address the scar tissue/fibrosis since she has been going longer before it becomes fibrotic again.    PT Frequency 2x / week    PT Duration 4 weeks    PT Treatment/Interventions ADLs/Self Care Home Management;Patient/family education;Manual techniques;Therapeutic exercise;Manual lymph drainage;Compression bandaging;Taping;Scar mobilization;Passive range of motion;Vasopneumatic Device    PT Next Visit Plan check for right shoulder soreness and manage as needed  continue with monitoring of right chest with MLD or manual techniques as needed schedule an appt a few weeks out to monitor lymphedema    PT Home Exercise Plan post op breast; self MLD, supine scapular series, lying over foam roll    Consulted and Agree with Plan of Care Patient           Patient will benefit from skilled therapeutic intervention in order to improve the following deficits and impairments:  Postural dysfunction, Pain, Increased edema, Decreased scar mobility, Increased fascial restricitons  Visit Diagnosis: Localized edema  Stiffness of right shoulder, not elsewhere classified  Abnormal posture  Chronic right shoulder pain  Muscle weakness (generalized)  Cervicalgia     Problem List Patient Active Problem List   Diagnosis Date Noted   PAD (peripheral artery disease) ([place]) 07/09/2020   Carpal tunnel syndrome 03/09/2020   Genetic testing 02/29/2020   Family history of breast cancer    Family history of ovarian cancer    Family history of colon cancer    Family  history of prostate cancer    Family history of skin cancer  Family history of leukemia    Malignant neoplasm of lower-outer quadrant of right breast of female, estrogen receptor positive ([place]) 02/02/2020   Osteopenia 01/25/2020   B12 deficiency 01/04/2020   Hearing loss 08/18/2018   Hyperlipidemia 11/28/2016   Anxiety and depression 11/28/2016   Chronic fatigue 11/28/2016   Recurrent genital herpes 11/28/2016   Melanoma of skin, site unspecified 06/26/2013    Allyson Sabal [facility] 08/13/2020, 4:02 PM  [place], [place], [address] Phone: [phone]   Fax:  [phone]  Name: Jean Morgan MRN: [id number] Date of Birth: [birth year]  Manus Gunning, PT 08/13/20 4:02 PM

## 2020-08-27 ENCOUNTER — Ambulatory Visit: Payer: BC Managed Care – PPO | Admitting: Physical Therapy

## 2020-09-05 ENCOUNTER — Ambulatory Visit: Payer: BC Managed Care – PPO | Attending: General Surgery | Admitting: Physical Therapy

## 2020-09-05 ENCOUNTER — Encounter: Payer: Self-pay | Admitting: Physical Therapy

## 2020-09-05 ENCOUNTER — Other Ambulatory Visit: Payer: Self-pay

## 2020-09-05 DIAGNOSIS — M25511 Pain in right shoulder: Secondary | ICD-10-CM | POA: Insufficient documentation

## 2020-09-05 DIAGNOSIS — R293 Abnormal posture: Secondary | ICD-10-CM

## 2020-09-05 DIAGNOSIS — L599 Disorder of the skin and subcutaneous tissue related to radiation, unspecified: Secondary | ICD-10-CM | POA: Diagnosis present

## 2020-09-05 DIAGNOSIS — M6281 Muscle weakness (generalized): Secondary | ICD-10-CM | POA: Diagnosis present

## 2020-09-05 DIAGNOSIS — M542 Cervicalgia: Secondary | ICD-10-CM | POA: Diagnosis present

## 2020-09-05 DIAGNOSIS — M25611 Stiffness of right shoulder, not elsewhere classified: Secondary | ICD-10-CM

## 2020-09-05 DIAGNOSIS — R600 Localized edema: Secondary | ICD-10-CM | POA: Diagnosis present

## 2020-09-05 DIAGNOSIS — G8929 Other chronic pain: Secondary | ICD-10-CM | POA: Insufficient documentation

## 2020-09-05 NOTE — Therapy (Signed)
[place], [place], [address] Phone: [phone]   Fax:  [phone]  Physical Therapy Treatment  Patient Details  Name: Jean Morgan MRN: [id number] Date of Birth: 10-18-1961 Referring Provider (PT): [name]   Encounter Date: 09/05/2020   PT End of Session - 09/05/20 1001    Visit Number 20    Number of Visits 24    Date for PT Re-Evaluation 10/03/20    PT Start Time 0918   pt arrived late   PT Stop Time 0958    PT Time Calculation (min) 40 min    Activity Tolerance Patient tolerated treatment well    Behavior During Therapy [facility] for tasks assessed/performed           Past Medical History:  Diagnosis Date  . Anxiety   . Cancer ([place])    skin  . Depression   . Family history of adverse reaction to anesthesia    brother had severe agitation coming out of anesthesia, had to be re-intubated  . Family history of breast cancer   . Family history of colon cancer   . Family history of leukemia   . Family history of ovarian cancer   . Family history of prostate cancer   . Family history of skin cancer   . Heart murmur    "slight"  . Hypothyroidism   . Optic neuritis   . 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 BREAST LUMPECTOMY WITH RADIOACTIVE SEED AND SENTINEL LYMPH NODE BIOPSY Right 04/19/2020   Procedure: RIGHT BREAST LUMPECTOMY WITH RADIOACTIVE SEED AND SENTINEL LYMPH NODE BIOPSY;  Surgeon: [name], MD;  Location: [place];  Service: General;  Laterality: Right;  PEC BLOCK  . DENTAL SURGERY    . MELANOMA EXCISION  2006   Right forearm  . SENTINEL NODE BIOPSY Right 2006  . TONSILLECTOMY    . TONSILLECTOMY      There were no vitals filed for this visit.   Subjective Assessment - 09/05/20 0918    Subjective [name] told me to wait to have the mammogram since it has only been 3 months since radiation. The hard places have  moved to the outside of my breast.    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 Radiation completed   has started anastrozole    Patient Stated Goals to get rid of the tightness in her breast    Currently in Pain? Yes    Pain Score 2     Pain Location Breast    Pain Orientation Right;Lateral    Pain Descriptors / Indicators Tender                             [facility] Adult PT Treatment/Exercise - 09/05/20 0001      Manual Therapy   Soft tissue mobilization along areas of fibrosis along lateral breast and inferior breast which softened tremendously by end of session    Manual Lymphatic Drainage (MLD) stationary circles moving fluid towards right inguinal nodes                        PT Long Term Goals -  07/10/20 1510      PT LONG TERM GOAL #1   Title Pt will be independent in a home exercise program for continued strengthening and stretching.    Time 4    Period Weeks    Status On-going      PT LONG TERM GOAL #2   Title Pt will report a 75% improvement in R neck and upper shoulder pain to allow improved comfort    Baseline 07/10/20- 75% improvement    Time 4    Period Weeks    Status Achieved      PT LONG TERM GOAL #3   Title Pt will report a 75% improvement in R inferior breast pain and swelling to allow improved comfort and to allow pt to receive radiation    Baseline 07/10/20- 25% improvement    Time 4    Period Weeks    Status On-going      PT LONG TERM GOAL #4   Title Pt will demonstrate 170 degrees of right shoulder flexion and abduction to allow her to return to prior level of function    Baseline flexion 150, abduction 158; 07/10/20- flexion 170, abduction 179    Time 4    Period Weeks    Status Achieved      PT LONG TERM GOAL #5   Title There  will be a 50% improvement in scar tissue to allow improved comfort.    Time 4    Period Weeks    Status New    Target Date 08/07/20                 Plan - 09/05/20 1003    Clinical Impression Statement Pt was out of town and had to cancel her last appointments. She presents back to PT today and reports the scar tissue/fibrosis is now in her lateral breast. She saw her doctor who believes it is a result of her radiation and educated pt to continue PT since it is helping soften the area. Continued with soft tissue mobilization to area of fibrosis/scar tissue in lateral and inferior breast with great improvement noted by end of session. Pt would benefit from additional skilled PT services to continue to decrease scar tissue/fibrosis in these areas to improve comfort and decrease tenderness.    PT Frequency 2x / week    PT Duration 4 weeks    PT Treatment/Interventions ADLs/Self Care Home Management;Patient/family education;Manual techniques;Therapeutic exercise;Manual lymph drainage;Compression bandaging;Taping;Scar mobilization;Passive range of motion;Vasopneumatic Device    PT Next Visit Plan continue STM/MLD for lateral and inferior breast fibrosis    PT Home Exercise Plan post op breast; self MLD, supine scapular series, lying over foam roll    Consulted and Agree with Plan of Care Patient           Patient will benefit from skilled therapeutic intervention in order to improve the following deficits and impairments:  Postural dysfunction, Pain, Increased edema, Decreased scar mobility, Increased fascial restricitons  Visit Diagnosis: Localized edema  Disorder of the skin and subcutaneous tissue related to radiation, unspecified  Stiffness of right shoulder, not elsewhere classified  Abnormal posture  Chronic right shoulder pain  Muscle weakness (generalized)  Cervicalgia     Problem List Patient Active Problem List   Diagnosis Date Noted  . PAD (peripheral artery  disease) ([place]) 07/09/2020  . Carpal tunnel syndrome 03/09/2020  . Genetic testing 02/29/2020  . Family history of breast cancer   . Family history of ovarian cancer   .  Family history of colon cancer   . Family history of prostate cancer   . Family history of skin cancer   . Family history of leukemia   . Malignant neoplasm of lower-outer quadrant of right breast of female, estrogen receptor positive ([place]) 02/02/2020  . Osteopenia 01/25/2020  . B12 deficiency 01/04/2020  . Hearing loss 08/18/2018  . Hyperlipidemia 11/28/2016  . Anxiety and depression 11/28/2016  . Chronic fatigue 11/28/2016  . Recurrent genital herpes 11/28/2016  . Melanoma of skin, site unspecified 06/26/2013    Allyson Sabal [facility] 09/05/2020, 10:05 AM  [place], [place], [address] Phone: [phone]   Fax:  [phone]  Name: Jean Morgan MRN: [id number] Date of Birth: Mar 13, 1961  Manus Gunning, PT 09/05/20 10:06 AM

## 2020-09-11 ENCOUNTER — Encounter: Payer: Self-pay | Admitting: Physical Therapy

## 2020-09-11 ENCOUNTER — Other Ambulatory Visit: Payer: Self-pay

## 2020-09-11 ENCOUNTER — Ambulatory Visit: Payer: BC Managed Care – PPO | Admitting: Physical Therapy

## 2020-09-11 DIAGNOSIS — R600 Localized edema: Secondary | ICD-10-CM | POA: Diagnosis not present

## 2020-09-11 DIAGNOSIS — R293 Abnormal posture: Secondary | ICD-10-CM

## 2020-09-11 DIAGNOSIS — M25611 Stiffness of right shoulder, not elsewhere classified: Secondary | ICD-10-CM

## 2020-09-11 DIAGNOSIS — L599 Disorder of the skin and subcutaneous tissue related to radiation, unspecified: Secondary | ICD-10-CM

## 2020-09-11 NOTE — Therapy (Signed)
[place], [place], [address] Phone: [phone]   Fax:  [phone]  Physical Therapy Treatment  Patient Details  Name: Jean Morgan MRN: [id number] Date of Birth: [birth year] Referring Provider (PT): [name]   Encounter Date: 09/11/2020   PT End of Session - 09/11/20 0859    Visit Number 21    Number of Visits 24    Date for PT Re-Evaluation 10/03/20    PT Start Time 0806    PT Stop Time 0855    PT Time Calculation (min) 49 min    Activity Tolerance Patient tolerated treatment well    Behavior During Therapy [facility] for tasks assessed/performed           Past Medical History:  Diagnosis Date  . Anxiety   . Cancer ([place])    skin  . Depression   . Family history of adverse reaction to anesthesia    brother had severe agitation coming out of anesthesia, had to be re-intubated  . Family history of breast cancer   . Family history of colon cancer   . Family history of leukemia   . Family history of ovarian cancer   . Family history of prostate cancer   . Family history of skin cancer   . Heart murmur    "slight"  . Hypothyroidism   . Optic neuritis   . 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 BREAST LUMPECTOMY WITH RADIOACTIVE SEED AND SENTINEL LYMPH NODE BIOPSY Right 04/19/2020   Procedure: RIGHT BREAST LUMPECTOMY WITH RADIOACTIVE SEED AND SENTINEL LYMPH NODE BIOPSY;  Surgeon: [name], MD;  Location: [facility];  Service: General;  Laterality: Right;  PEC BLOCK  . DENTAL SURGERY    . MELANOMA EXCISION  2006   Right forearm  . SENTINEL NODE BIOPSY Right 2006  . TONSILLECTOMY    . TONSILLECTOMY      There were no vitals filed for this visit.   Subjective Assessment - 09/11/20 0808    Subjective The fibrosis is moving to the side. It feels about the same.    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 Radiation completed   has started anastrozole    Patient Stated Goals to get rid of the tightness in her breast    Currently in Pain? Yes    Pain Score 2     Pain Location Axilla    Pain Orientation Right    Pain Descriptors / Indicators Tightness    Pain Type Surgical pain                             [facility] Adult PT Treatment/Exercise - 09/11/20 0001      Manual Therapy   Soft tissue mobilization with pt lying over foam roll and right arm extended to stretch R pec and decrease tightness: along areas of fibrosis along lateral breast and inferior breast, also just lateral to nipple where pt had increaed tenderness and an area of fibrosis was palpable- areas softened by end of session. Used cocoa butter for last half of STM  PT Long Term Goals - 07/10/20 1510      PT LONG TERM GOAL #1   Title Pt will be independent in a home exercise program for continued strengthening and stretching.    Time 4    Period Weeks    Status On-going      PT LONG TERM GOAL #2   Title Pt will report a 75% improvement in R neck and upper shoulder pain to allow improved comfort    Baseline 07/10/20- 75% improvement    Time 4    Period Weeks    Status Achieved      PT LONG TERM GOAL #3   Title Pt will report a 75% improvement in R inferior breast pain and swelling to allow improved comfort and to allow pt to receive radiation    Baseline 07/10/20- 25% improvement    Time 4    Period Weeks    Status On-going      PT LONG TERM GOAL #4   Title Pt will demonstrate 170 degrees of right shoulder flexion and abduction to allow her to return to prior level of function    Baseline flexion 150, abduction 158; 07/10/20- flexion 170, abduction 179    Time 4    Period Weeks    Status Achieved       PT LONG TERM GOAL #5   Title There will be a 50% improvement in scar tissue to allow improved comfort.    Time 4    Period Weeks    Status New    Target Date 08/07/20                 Plan - 09/11/20 0859    Clinical Impression Statement Area of fibrosis/scar tissue has returned since last session though the section on the lateral breast is not as hard as it was last session. There is still increased fibrosis in inferior breast so focused on soft tissue mobilization to lateral and inferior breast and also to area just lateral to nipple which was very tender. While performing soft tissue mobilization therapist had pt lie over foam roll with right UE outstretched to help decrease pec tightness.    PT Frequency 2x / week    PT Duration 4 weeks    PT Treatment/Interventions ADLs/Self Care Home Management;Patient/family education;Manual techniques;Therapeutic exercise;Manual lymph drainage;Compression bandaging;Taping;Scar mobilization;Passive range of motion;Vasopneumatic Device    PT Next Visit Plan continue STM/MLD for lateral and inferior breast fibrosis    PT Home Exercise Plan post op breast; self MLD, supine scapular series, lying over foam roll    Consulted and Agree with Plan of Care Patient           Patient will benefit from skilled therapeutic intervention in order to improve the following deficits and impairments:  Postural dysfunction, Pain, Increased edema, Decreased scar mobility, Increased fascial restricitons  Visit Diagnosis: Disorder of the skin and subcutaneous tissue related to radiation, unspecified  Stiffness of right shoulder, not elsewhere classified  Abnormal posture     Problem List Patient Active Problem List   Diagnosis Date Noted  . PAD (peripheral artery disease) ([place]) 07/09/2020  . Carpal tunnel syndrome 03/09/2020  . Genetic testing 02/29/2020  . Family history of breast cancer   . Family history of ovarian cancer   . Family history of  colon cancer   . Family history of prostate cancer   . Family history of skin cancer   . Family history of leukemia   .  Malignant neoplasm of lower-outer quadrant of right breast of female, estrogen receptor positive ([place]) 02/02/2020  . Osteopenia 01/25/2020  . B12 deficiency 01/04/2020  . Hearing loss 08/18/2018  . Hyperlipidemia 11/28/2016  . Anxiety and depression 11/28/2016  . Chronic fatigue 11/28/2016  . Recurrent genital herpes 11/28/2016  . Melanoma of skin, site unspecified 06/26/2013    Allyson Sabal [facility] 09/11/2020, 9:02 AM  [place], [place], [address] Phone: [phone]   Fax:  [phone]  Name: Jean Morgan MRN: [id number] Date of Birth: 06/07/1961  Manus Gunning, PT 09/11/20 9:02 AM

## 2020-09-18 ENCOUNTER — Other Ambulatory Visit: Payer: Self-pay

## 2020-09-18 ENCOUNTER — Ambulatory Visit: Payer: BC Managed Care – PPO | Admitting: Physical Therapy

## 2020-09-18 ENCOUNTER — Encounter: Payer: Self-pay | Admitting: Physical Therapy

## 2020-09-18 DIAGNOSIS — R600 Localized edema: Secondary | ICD-10-CM | POA: Diagnosis not present

## 2020-09-18 DIAGNOSIS — L599 Disorder of the skin and subcutaneous tissue related to radiation, unspecified: Secondary | ICD-10-CM

## 2020-09-18 NOTE — Therapy (Signed)
[place], [place], [address] Phone: [phone]   Fax:  [phone]  Physical Therapy Treatment  Patient Details  Name: Jean Morgan MRN: [id number] Date of Birth: [birth year] Referring Provider (PT): [name]   Encounter Date: 09/18/2020   PT End of Session - 09/18/20 1558    Visit Number 22    Number of Visits 24    Date for PT Re-Evaluation 10/03/20    PT Start Time 2376    PT Stop Time 1556    PT Time Calculation (min) 50 min    Activity Tolerance Patient tolerated treatment well    Behavior During Therapy [facility] for tasks assessed/performed           Past Medical History:  Diagnosis Date  . Anxiety   . Cancer ([place])    skin  . Depression   . Family history of adverse reaction to anesthesia    brother had severe agitation coming out of anesthesia, had to be re-intubated  . Family history of breast cancer   . Family history of colon cancer   . Family history of leukemia   . Family history of ovarian cancer   . Family history of prostate cancer   . Family history of skin cancer   . Heart murmur    "slight"  . Hypothyroidism   . Optic neuritis   . 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 BREAST LUMPECTOMY WITH RADIOACTIVE SEED AND SENTINEL LYMPH NODE BIOPSY Right 04/19/2020   Procedure: RIGHT BREAST LUMPECTOMY WITH RADIOACTIVE SEED AND SENTINEL LYMPH NODE BIOPSY;  Surgeon: [name], MD;  Location: [facility];  Service: General;  Laterality: Right;  PEC BLOCK  . DENTAL SURGERY    . MELANOMA EXCISION  2006   Right forearm  . SENTINEL NODE BIOPSY Right 2006  . TONSILLECTOMY    . TONSILLECTOMY      There were no vitals filed for this visit.   Subjective Assessment - 09/18/20 1506    Subjective I think it might be getting a little bit better.    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 Radiation completed   has started anastrozole    Patient Stated Goals to get rid of the tightness in her breast    Currently in Pain? No/denies    Pain Score 0-No pain                             [facility] Adult PT Treatment/Exercise - 09/18/20 0001      Manual Therapy   Soft tissue mobilization along area of fibrosis at inferior breast just inferior to nipple and along scar line    Manual Lymphatic Drainage (MLD) stationary circles moving fluid towards right inguinal nodes                        PT Long Term Goals - 07/10/20 1510      PT LONG TERM GOAL #1   Title Pt will be independent in a home exercise program for continued strengthening and stretching.    Time 4    Period Weeks    Status On-going  PT LONG TERM GOAL #2   Title Pt will report a 75% improvement in R neck and upper shoulder pain to allow improved comfort    Baseline 07/10/20- 75% improvement    Time 4    Period Weeks    Status Achieved      PT LONG TERM GOAL #3   Title Pt will report a 75% improvement in R inferior breast pain and swelling to allow improved comfort and to allow pt to receive radiation    Baseline 07/10/20- 25% improvement    Time 4    Period Weeks    Status On-going      PT LONG TERM GOAL #4   Title Pt will demonstrate 170 degrees of right shoulder flexion and abduction to allow her to return to prior level of function    Baseline flexion 150, abduction 158; 07/10/20- flexion 170, abduction 179    Time 4    Period Weeks    Status Achieved      PT LONG TERM GOAL #5   Title There will be a 50% improvement in scar tissue to allow improved comfort.    Time 4    Period Weeks    Status New    Target Date 08/07/20                 Plan - 09/18/20 1559    Clinical Impression Statement Pt demonstrates  improvement of fibrosis in right lateral breast. No fibrosis is palpable in this area now so focused on area of fibrosis in inferior breast just below nipple and along scar line. By end of session this area had improved greatly and only one small fibrotic area remained.    PT Frequency 2x / week    PT Duration 4 weeks    PT Treatment/Interventions ADLs/Self Care Home Management;Patient/family education;Manual techniques;Therapeutic exercise;Manual lymph drainage;Compression bandaging;Taping;Scar mobilization;Passive range of motion;Vasopneumatic Device    PT Next Visit Plan continue STM/MLD for lateral and inferior breast fibrosis    PT Home Exercise Plan post op breast; self MLD, supine scapular series, lying over foam roll    Consulted and Agree with Plan of Care Patient           Patient will benefit from skilled therapeutic intervention in order to improve the following deficits and impairments:     Visit Diagnosis: Disorder of the skin and subcutaneous tissue related to radiation, unspecified     Problem List Patient Active Problem List   Diagnosis Date Noted  . PAD (peripheral artery disease) ([place]) 07/09/2020  . Carpal tunnel syndrome 03/09/2020  . Genetic testing 02/29/2020  . Family history of breast cancer   . Family history of ovarian cancer   . Family history of colon cancer   . Family history of prostate cancer   . Family history of skin cancer   . Family history of leukemia   . Malignant neoplasm of lower-outer quadrant of right breast of female, estrogen receptor positive ([place]) 02/02/2020  . Osteopenia 01/25/2020  . B12 deficiency 01/04/2020  . Hearing loss 08/18/2018  . Hyperlipidemia 11/28/2016  . Anxiety and depression 11/28/2016  . Chronic fatigue 11/28/2016  . Recurrent genital herpes 11/28/2016  . Melanoma of skin, site unspecified 06/26/2013    Jean Morgan [facility] 09/18/2020, 4:00 PM  [place], [place], [address] Phone: [phone]   Fax:  [phone]  Name: Jean Morgan MRN: [id number] Date of Birth: 06/07/1961  Manus Gunning,  PT 09/18/20 4:00 PM

## 2020-09-26 ENCOUNTER — Encounter: Payer: Self-pay | Admitting: Nurse Practitioner

## 2020-09-26 ENCOUNTER — Ambulatory Visit: Payer: BC Managed Care – PPO | Attending: General Surgery | Admitting: Physical Therapy

## 2020-09-26 ENCOUNTER — Other Ambulatory Visit: Payer: Self-pay

## 2020-09-26 ENCOUNTER — Inpatient Hospital Stay: Payer: BC Managed Care – PPO | Attending: Nurse Practitioner | Admitting: Nurse Practitioner

## 2020-09-26 ENCOUNTER — Encounter: Payer: Self-pay | Admitting: Physical Therapy

## 2020-09-26 VITALS — BP 124/63 | HR 77 | Temp 98.1°F | Resp 20 | Ht 66.0 in | Wt 146.5 lb

## 2020-09-26 DIAGNOSIS — Z170 Estrogen receptor positive status [ER+]: Secondary | ICD-10-CM | POA: Diagnosis not present

## 2020-09-26 DIAGNOSIS — Z808 Family history of malignant neoplasm of other organs or systems: Secondary | ICD-10-CM | POA: Insufficient documentation

## 2020-09-26 DIAGNOSIS — Z79899 Other long term (current) drug therapy: Secondary | ICD-10-CM | POA: Insufficient documentation

## 2020-09-26 DIAGNOSIS — C50511 Malignant neoplasm of lower-outer quadrant of right female breast: Secondary | ICD-10-CM | POA: Diagnosis present

## 2020-09-26 DIAGNOSIS — M2550 Pain in unspecified joint: Secondary | ICD-10-CM | POA: Diagnosis not present

## 2020-09-26 DIAGNOSIS — R413 Other amnesia: Secondary | ICD-10-CM | POA: Insufficient documentation

## 2020-09-26 DIAGNOSIS — L599 Disorder of the skin and subcutaneous tissue related to radiation, unspecified: Secondary | ICD-10-CM | POA: Diagnosis not present

## 2020-09-26 DIAGNOSIS — M25611 Stiffness of right shoulder, not elsewhere classified: Secondary | ICD-10-CM | POA: Diagnosis present

## 2020-09-26 DIAGNOSIS — Z833 Family history of diabetes mellitus: Secondary | ICD-10-CM | POA: Insufficient documentation

## 2020-09-26 DIAGNOSIS — R42 Dizziness and giddiness: Secondary | ICD-10-CM | POA: Diagnosis present

## 2020-09-26 DIAGNOSIS — Z885 Allergy status to narcotic agent status: Secondary | ICD-10-CM | POA: Diagnosis not present

## 2020-09-26 DIAGNOSIS — Z8249 Family history of ischemic heart disease and other diseases of the circulatory system: Secondary | ICD-10-CM | POA: Insufficient documentation

## 2020-09-26 DIAGNOSIS — R232 Flushing: Secondary | ICD-10-CM | POA: Insufficient documentation

## 2020-09-26 DIAGNOSIS — Z8042 Family history of malignant neoplasm of prostate: Secondary | ICD-10-CM | POA: Diagnosis not present

## 2020-09-26 DIAGNOSIS — Z8371 Family history of colonic polyps: Secondary | ICD-10-CM | POA: Insufficient documentation

## 2020-09-26 DIAGNOSIS — Z8041 Family history of malignant neoplasm of ovary: Secondary | ICD-10-CM | POA: Diagnosis not present

## 2020-09-26 DIAGNOSIS — Z806 Family history of leukemia: Secondary | ICD-10-CM | POA: Insufficient documentation

## 2020-09-26 DIAGNOSIS — Z800 Family history of malignant neoplasm of digestive organs: Secondary | ICD-10-CM | POA: Insufficient documentation

## 2020-09-26 DIAGNOSIS — Z7182 Exercise counseling: Secondary | ICD-10-CM | POA: Diagnosis not present

## 2020-09-26 DIAGNOSIS — M85852 Other specified disorders of bone density and structure, left thigh: Secondary | ICD-10-CM | POA: Insufficient documentation

## 2020-09-26 DIAGNOSIS — N6002 Solitary cyst of left breast: Secondary | ICD-10-CM | POA: Insufficient documentation

## 2020-09-26 DIAGNOSIS — Z85820 Personal history of malignant melanoma of skin: Secondary | ICD-10-CM | POA: Diagnosis not present

## 2020-09-26 DIAGNOSIS — Z8349 Family history of other endocrine, nutritional and metabolic diseases: Secondary | ICD-10-CM | POA: Insufficient documentation

## 2020-09-26 DIAGNOSIS — Z803 Family history of malignant neoplasm of breast: Secondary | ICD-10-CM | POA: Diagnosis not present

## 2020-09-26 DIAGNOSIS — R5383 Other fatigue: Secondary | ICD-10-CM | POA: Insufficient documentation

## 2020-09-26 DIAGNOSIS — Z8261 Family history of arthritis: Secondary | ICD-10-CM | POA: Diagnosis not present

## 2020-09-26 DIAGNOSIS — Z8379 Family history of other diseases of the digestive system: Secondary | ICD-10-CM | POA: Insufficient documentation

## 2020-09-26 NOTE — Therapy (Signed)
[place], [place], [address] Phone: [phone]   Fax:  [phone]  Physical Therapy Treatment  Patient Details  Name: Jean Morgan MRN: [id number] Date of Birth: October 26, 1961 Referring Provider (PT): [name]   Encounter Date: 09/26/2020   PT End of Session - 09/26/20 1602    Visit Number 23    Number of Visits 24    Date for PT Re-Evaluation 10/03/20    PT Start Time 1504    PT Stop Time 1555    PT Time Calculation (min) 51 min    Activity Tolerance Patient tolerated treatment well    Behavior During Therapy [facility] for tasks assessed/performed           Past Medical History:  Diagnosis Date  . Anxiety   . Cancer ([place])    skin  . Depression   . Family history of adverse reaction to anesthesia    brother had severe agitation coming out of anesthesia, had to be re-intubated  . Family history of breast cancer   . Family history of colon cancer   . Family history of leukemia   . Family history of ovarian cancer   . Family history of prostate cancer   . Family history of skin cancer   . Heart murmur    "slight"  . Hypothyroidism   . Optic neuritis   . 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 BREAST LUMPECTOMY WITH RADIOACTIVE SEED AND SENTINEL LYMPH NODE BIOPSY Right 04/19/2020   Procedure: RIGHT BREAST LUMPECTOMY WITH RADIOACTIVE SEED AND SENTINEL LYMPH NODE BIOPSY;  Surgeon: [name], MD;  Location: [facility];  Service: General;  Laterality: Right;  PEC BLOCK  . DENTAL SURGERY    . MELANOMA EXCISION  2006   Right forearm  . SENTINEL NODE BIOPSY Right 2006  . TONSILLECTOMY    . TONSILLECTOMY      There were no vitals filed for this visit.   Subjective Assessment - 09/26/20 1504    Subjective That area got a lot harder. I have been having some tightness across my chest.    Pertinent History Diagnosis of Rt stage IB ER/PR  positive and HER2 negative breast cancer, history of Rt melanoma removal with 1 lymph node removed, osteopenia, B12 deficiency, will be having lumpectomy and SLNB on 04/19/20 with [name] Radiation completed   has started anastrozole    Patient Stated Goals to get rid of the tightness in her breast    Currently in Pain? Yes    Pain Score 2     Pain Location Axilla    Pain Descriptors / Indicators Sore;Aching    Pain Type Acute pain    Pain Onset In the past 7 days    Pain Frequency Constant    Aggravating Factors  nothing    Pain Relieving Factors nothing    Effect of Pain on Daily Activities none                 LYMPHEDEMA/ONCOLOGY QUESTIONNAIRE - 09/26/20 0001      Right Upper Extremity Lymphedema   10 cm Proximal to Olecranon Process 26 cm    Olecranon Process 25.3 cm    10 cm Proximal to Ulnar Styloid Process 19.3 cm    Just Proximal to Ulnar Styloid Process 15.5 cm    Across Hand at [employer] 17 cm    At  Base of 2nd Digit 5.5 cm           L-DEX FLOWSHEETS - 09/26/20 1600      L-DEX LYMPHEDEMA SCREENING   Measurement Type Unilateral    L-DEX MEASUREMENT EXTREMITY Upper Extremity    POSITION  Standing    DOMINANT SIDE Right    At Risk Side Right    BASELINE SCORE (UNILATERAL) -1.8    L-DEX SCORE (UNILATERAL) -1.5    VALUE CHANGE (UNILAT) 0.3                     [facility] Adult PT Treatment/Exercise - 09/26/20 0001      Manual Therapy   Soft tissue mobilization along area of fibrosis at inferior breast just inferior to nipple and along scar line and along right pec with UE in end range abduction to decrease tightness    Manual Lymphatic Drainage (MLD) In supine: short neck, superficial and deep abdominals, right inguinal nodes and establishment of axillo inguinal pathway, Lt axillary  nodes and establishment of interaxillary pathway, R breast with focus on inferior breast in area of fibrosis moving fluid towards pathways finished retracing steps in  supine redirecting fluid to pathways and nodes                       PT Long Term Goals - 07/10/20 1510      PT LONG TERM GOAL #1   Title Pt will be independent in a home exercise program for continued strengthening and stretching.    Time 4    Period Weeks    Status On-going      PT LONG TERM GOAL #2   Title Pt will report a 75% improvement in R neck and upper shoulder pain to allow improved comfort    Baseline 07/10/20- 75% improvement    Time 4    Period Weeks    Status Achieved      PT LONG TERM GOAL #3   Title Pt will report a 75% improvement in R inferior breast pain and swelling to allow improved comfort and to allow pt to receive radiation    Baseline 07/10/20- 25% improvement    Time 4    Period Weeks    Status On-going      PT LONG TERM GOAL #4   Title Pt will demonstrate 170 degrees of right shoulder flexion and abduction to allow her to return to prior level of function    Baseline flexion 150, abduction 158; 07/10/20- flexion 170, abduction 179    Time 4    Period Weeks    Status Achieved      PT LONG TERM GOAL #5   Title There will be a 50% improvement in scar tissue to allow improved comfort.    Time 4    Period Weeks    Status New    Target Date 08/07/20                 Plan - 09/26/20 1604    Clinical Impression Statement Repeated SOZO today as it was time and pt felt she might be having some swelling though none was visible. There was only a 0.3 change from baseline so there is no evidence of subclinical lymphedema. Continued with manual therapy to area of fibrosis in right inferior breast that returned since last session. This area softened and improved greatly with treatment today.    PT Frequency 2x / week    PT  Duration 4 weeks    PT Treatment/Interventions ADLs/Self Care Home Management;Patient/family education;Manual techniques;Therapeutic exercise;Manual lymph drainage;Compression bandaging;Taping;Scar mobilization;Passive range  of motion;Vasopneumatic Device    PT Next Visit Plan continue STM/MLD for lateral and inferior breast fibrosis - advise on vestib rehab    PT Home Exercise Plan post op breast; self MLD, supine scapular series, lying over foam roll    Consulted and Agree with Plan of Care Patient           Patient will benefit from skilled therapeutic intervention in order to improve the following deficits and impairments:  Postural dysfunction, Pain, Increased edema, Decreased scar mobility, Increased fascial restricitons  Visit Diagnosis: Disorder of the skin and subcutaneous tissue related to radiation, unspecified  Stiffness of right shoulder, not elsewhere classified     Problem List Patient Active Problem List   Diagnosis Date Noted  . PAD (peripheral artery disease) ([place]) 07/09/2020  . Carpal tunnel syndrome 03/09/2020  . Genetic testing 02/29/2020  . Family history of breast cancer   . Family history of ovarian cancer   . Family history of colon cancer   . Family history of prostate cancer   . Family history of skin cancer   . Family history of leukemia   . Malignant neoplasm of lower-outer quadrant of right breast of female, estrogen receptor positive ([place]) 02/02/2020  . Osteopenia 01/25/2020  . B12 deficiency 01/04/2020  . Hearing loss 08/18/2018  . Hyperlipidemia 11/28/2016  . Anxiety and depression 11/28/2016  . Chronic fatigue 11/28/2016  . Recurrent genital herpes 11/28/2016  . Melanoma of skin, site unspecified 06/26/2013    Allyson Sabal [facility] 09/26/2020, 4:06 PM  [place], [place], [address] Phone: [phone]   Fax:  [phone]  Name: Jean Morgan MRN: [id number] Date of Birth: June 25, 1961  Manus Gunning, PT 09/26/20 4:07 PM

## 2020-09-26 NOTE — Progress Notes (Signed)
CLINIC:  Survivorship   Patient Care Team: [name], [name], MD as PCP - General (Family Medicine) Rolm Bookbinder, MD as Consulting Physician (Dermatology) [name], RN as Oncology Nurse Navigator [name], RN as Oncology Nurse Navigator [name], MD as Consulting Physician (General Surgery) Truitt Merle, MD as Consulting Physician (Hematology) [name], MD as Consulting Physician (Radiation Oncology) Alla Feeling, NP as Nurse Practitioner (Nurse Practitioner)    REASON FOR VISIT:  Routine follow-up post-treatment for a recent history of breast cancer.  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 female, estrogen receptor positive ([place]) Staging form: Breast, AJCC 8th Edition - Clinical stage from 01/31/2020: Stage IB (cT2, cN0, cM0, G2, ER+, PR+, HER2-) - Signed by Truitt Merle, MD on 02/07/2020    Malignant neoplasm of lower-outer quadrant of right breast of female, estrogen receptor positive ([place])  01/23/2020 Imaging   DEXA 01/23/20  Osteopenia with lowest T-score -1.6 at left femur neck  Site Region Measured Date Measured Age YA BMD Significant CHANGE T-score DualFemur Neck Left  01/23/2020    60.1         -1.6    0.815 g/cm2   AP Spine  L1-L4 (L3) 01/23/2020    60.1         -1.1    1.035 g/cm2   DualFemur Total Mean 01/23/2020    60.1         -1.2    0.851 g/cm2   01/23/2020 Mammogram   Diangostic Mammogram 01/23/20  IMPRESSION: 1. There is a highly suspicious mass in the right breast at 6 o'clock 3 cm from the nipple demonstrates an irregular hypoechoic shadowing mass measuring 2.3 x 1.4 x 1.7 cm associated with pleomorphic calcifications.   2.  No evidence of right axillary lymphadenopathy.   3. There is a benign-appearing cyst in the superior left breast at 12:30, 3 cm from the nipple demonstrates an anechoic oval circumscribed mass measuring 0.9 x 0.8 x 0.9  cm. No evidence of left breast malignancy.    01/31/2020 Cancer Staging   Staging form: Breast, AJCC 8th Edition - Clinical stage from 01/31/2020: Stage IB (cT2, cN0, cM0, G2, ER+, PR+, HER2-) - Signed by Truitt Merle, MD on 02/07/2020   01/31/2020 Initial Biopsy   Diagnosis 01/31/20 Breast, right, needle core biopsy, 6 o'clock - INVASIVE MAMMARY CARCINOMA AND MAMMARY CARCINOMA IN SITU WITH PERINEURAL INVASION AND CALCIFICATIONS. Microscopic Comment The carcinoma is nuclear grade 2. The greatest linear extent of tumor in any one core is 16 mm. Immunohistochemistry of E-cadherin will be reported separately. Ancillary studies will be reported separately.   01/31/2020 Receptors her2   PROGNOSTIC INDICATORS Results: IMMUNOHISTOCHEMICAL AND MORPHOMETRIC ANALYSIS PERFORMED MANUALLY The tumor cells are NEGATIVE for Her2 (1+). Estrogen Receptor: 100%, POSITIVE, STRONG STAINING INTENSITY Progesterone Receptor: 80%, POSITIVE, STRONG STAINING INTENSITY Proliferation Marker Ki67: 15%   01/31/2020 Oncotype testing   Low risk with recurrence score 17.  Distant risk of recurrence at 9 years 5% with AI or Tamoxifen. There is less than 1% benefit of chemotherapy.    02/02/2020 Initial Diagnosis   Malignant neoplasm of lower-outer quadrant of right breast of female, estrogen receptor positive ([place])   02/17/2020 Imaging   Breast MRI  IMPRESSION: Known malignancy in the LOWER central portion of the RIGHT breast measuring 3.1 centimeters.   No adenopathy in the RIGHT axilla.   No additional areas of concern are  identified in either breast.     02/27/2020 -  Neo-Adjuvant Anti-estrogen oral therapy   Anastrozole 80m once daily starting in 02/27/20 with half tablet to see if tolerable.    02/28/2020 Genetic Testing   Negative genetic testing:  No pathogenic variants detected on the [facility] Multi-Cancer Panel. Two variants of uncertain significance were detected - one in the HOXB13 gene called c.215G>T and one in the  RNF43 gene called c.2139A>T. The report date is 02/28/2020.  The Multi-Cancer Panel offered by [facility] includes sequencing and/or deletion duplication testing of the following 85 genes: AIP, ALK, APC, ATM, AXIN2,BAP1,  BARD1, BLM, BMPR1A, BRCA1, BRCA2, BRIP1, CASR, CDC73, CDH1, CDK4, CDKN1B, CDKN1C, CDKN2A (p14ARF), CDKN2A (p16INK4a), CEBPA, CHEK2, CTNNA1, DICER1, DIS3L2, EGFR (c.2369C>T, p.Thr790Met variant only), EPCAM (Deletion/duplication testing only), FH, FLCN, GATA2, GPC3, GREM1 (Promoter region deletion/duplication testing only), HOXB13 (c.251G>A, p.Gly84Glu), HRAS, KIT, MAX, MEN1, MET, MITF (c.952G>A, p.Glu318Lys variant only), MLH1, MSH2, MSH3, MSH6, MUTYH, NBN, NF1, NF2, NTHL1, PALB2, PDGFRA, PHOX2B, PMS2, POLD1, POLE, POT1, PRKAR1A, PTCH1, PTEN, RAD50, RAD51C, RAD51D, RB1, RECQL4, RET, RNF43, RUNX1, SDHAF2, SDHA (sequence changes only), SDHB, SDHC, SDHD, SMAD4, SMARCA4, SMARCB1, SMARCE1, STK11, SUFU, TERC, TERT, TMEM127, TP53, TSC1, TSC2, VHL, WRN and WT1.    04/19/2020 Surgery   RIGHT BREAST LUMPECTOMY WITH RADIOACTIVE SEED AND SENTINEL LYMPH NODE BIOPSY by Dr [name]   04/19/2020 Pathology Results   FINAL MICROSCOPIC DIAGNOSIS:   A. BREAST, RIGHT, LUMPECTOMY:  -  Invasive ductal carcinoma, Nottingham grade 2 of 3, 2.2 cm  -  Ductal carcinoma in-situ  -  Calcifications associated with carcinoma  -  Margins uninvolved by carcinoma (< 0.1 cm; posterior margin)  -  Previous biopsy site changes present  -  See oncology table and comment below   B. LYMPH NODE, RIGHT AXILLARY #1, SENTINEL, BIOPSY:  -  No carcinoma identified in one lymph node (0/1)  -  See comment   C. LYMPH NODE, RIGHT AXILLARY, SENTINEL, BIOPSY:  -  No carcinoma identified in one lymph node (0/1)  -  See comment   D. LYMPH NODE, RIGHT AXILLARY #2, SENTINEL, BIOPSY:  -  No carcinoma identified in one lymph node (0/1)  -  See comment   COMMENT:   A.  The previous biopsy and E-cadherin immunohistochemistry    (([id number] was reviewed in conjunction with this case and supports  the above interpretation.   B-D. Given the lobular-like morphology, cytokeratin AE1/3 was performed  on the sentinel lymph nodes to exclude micrometastasis.  There is no  evidence of metastatic carcinoma by immunohistochemistry.    05/28/2020 - 06/22/2020 Radiation Therapy   Adjuvant Radiation with Dr MLisbeth Renshaw   09/26/2020 Survivorship   SCP delivered by LCira Rue NP      INTERVAL HISTORY:  [facility] to review her survivorship care plan detailing her treatment course for breast cancer, as well as monitoring long-term side effects of that treatment, education regarding health maintenance, screening, and overall wellness and health promotion.     She is doing well overall, continues anastrozole.  Her main issues are memory problems and fatigue.  She is also losing some hair and has joint pain in the knees.  Her discomfort is moderate making activities more difficult.  She has intense hot flashes 2/day but tolerable.  She saw Dr. TMarlou Starkslast month.  Has some discomfort in the right axilla, continues PT and exercises and this area usually softens.  Denies  breast mass or nipple discharge.    ONCOLOGY TREATMENT TEAM:  1. Surgeon:  [name] at [facility] 2. Medical Oncologist: Dr. Burr Medico 3. Radiation Oncologist: Dr. Lisbeth Renshaw    PAST MEDICAL/SURGICAL HISTORY:  Past Medical History:  Diagnosis Date  . Anxiety   . Cancer ([place])    skin  . Depression   . Family history of adverse reaction to anesthesia    brother had severe agitation coming out of anesthesia, had to be re-intubated  . Family history of breast cancer   . Family history of colon cancer   . Family history of leukemia   . Family history of ovarian cancer   . Family history of prostate cancer   . Family history of skin cancer   . Heart murmur    "slight"  . Hypothyroidism   .  Optic neuritis   . Rocky Mountain spotted fever    1970's   Past Surgical History:  Procedure Laterality Date  . BREAST LUMPECTOMY WITH RADIOACTIVE SEED AND SENTINEL LYMPH NODE BIOPSY Right 04/19/2020   Procedure: RIGHT BREAST LUMPECTOMY WITH RADIOACTIVE SEED AND SENTINEL LYMPH NODE BIOPSY;  Surgeon: [name], MD;  Location: [facility];  Service: General;  Laterality: Right;  PEC BLOCK  . DENTAL SURGERY    . MELANOMA EXCISION  2006   Right forearm  . SENTINEL NODE BIOPSY Right 2006  . TONSILLECTOMY    . TONSILLECTOMY       ALLERGIES:  Allergies  Allergen Reactions  . Latex Itching  . Oxycodone Other (See Comments)    Other Reaction: "too strong a drug for me"     CURRENT MEDICATIONS:  Outpatient Encounter Medications as of 09/26/2020  Medication Sig  . anastrozole (ARIMIDEX) 1 MG tablet TAKE 1 TABLET BY MOUTH EVERY DAY  . Calcium-Vitamin D-Vitamin K [id number] MG-UNT-MCG CHEW Chew 2 each by mouth daily.  . cetirizine (ZYRTEC) 5 MG tablet Take 5 mg by mouth daily as needed for allergies.  . citalopram (CELEXA) 20 MG tablet Take 1 tablet (20 mg total) by mouth daily.  . diazepam (VALIUM) 5 MG tablet Take 0.5-1 tablets (2.5-5 mg total) by mouth every 12 (twelve) hours as needed for anxiety. Take 1-2 tabs 15 min before procedures.  Marland Kitchen HYDROcodone-acetaminophen (NORCO/VICODIN) 5-325 MG tablet Take 1-2 tablets by mouth every 6 (six) hours as needed for moderate pain or severe pain.  . valACYclovir (VALTREX) 1000 MG tablet TAKE 1 TABLET ONCE DAILY FOR 5 DAYS AS NEEDED, START ASAP WITHIN 48 HOURS OF ONSET OF SYMPTOMS   No facility-administered encounter medications on file as of 09/26/2020.     ONCOLOGIC FAMILY HISTORY:  Family History  Problem Relation Age of Onset  . Colon polyps Mother        more than 10  . Colitis Mother   . Arthritis Mother   . Hyperlipidemia Mother   . Diabetes Mother   . Skin cancer Mother   . Heart disease Father   . Hyperlipidemia Father   .  Hypertension Father   . Colon cancer Paternal Aunt        dx. in her 107s  . Diabetes Maternal Grandmother   . Breast cancer Maternal Grandmother        dx. >50  . Breast cancer Other 40       maternal great-aunt  . Ovarian cancer Other 76  . Colon cancer Other 85  . Skin cancer Other 27  . Prostate cancer Half-Brother 36  . Leukemia  Maternal Great-grandmother 50       chronic     GENETIC COUNSELING/TESTING: Yes, negative.  V [place] in HOXB13 and RNF43   SOCIAL HISTORY:  Deepti Gunawan is very living in [place].  Denies smoking history.  Up-to-date on age-appropriate cancer screenings   PHYSICAL EXAMINATION:  Vital Signs:   Vitals:   09/26/20 1015  BP: 124/63  Pulse: 77  Resp: 20  Temp: 98.1 F (36.7 C)  SpO2: 100%   Filed Weights   09/26/20 1015  Weight: 146 lb 8 oz (66.5 kg)   General: Well-nourished, well-appearing female in no acute distress.   HEENT:  Sclerae anicteric Lymph: No cervical, supraclavicular, or infraclavicular lymphadenopathy noted on palpation.  Respiratory:  breathing non-labored.  Neuro: No focal deficits. Steady gait.  Psych: Mood and affect normal and appropriate for situation.  Extremities: No edema. MSK: No focal spinal tenderness to palpation.  Full range of motion in bilateral upper extremities Skin: Warm and dry. Breast exam: Breasts are symmetric without nipple discharge or inversion.  S/p right lumpectomy.  Incisions completely healed. Mild thickening of the skin and edema of the lower outer breast. No discrete mass in either breast or axilla that I could appreciate.   LABORATORY DATA:  None for this visit.  DIAGNOSTIC IMAGING:  None for this visit.      ASSESSMENT AND PLAN:  Ms.. Jean Morgan is a pleasant 59 y.o. female with Stage 1A right breast invasive ductal carcinoma, ER+/PR+/HER2-, diagnosed in 01/2020 treated with lumpectomy, adjuvant radiation therapy, and anti-estrogen therapy with anastrozole beginning in 02/2020.  She  presents to the Survivorship Clinic for our initial meeting and routine follow-up post-completion of treatment for breast cancer.    1. Stage 1A right breast cancer:  Ms. Angevine is continuing to recover from definitive treatment for breast cancer, she has mild residual fatigue.  Breast exam is unremarkable, no clinical concern for recurrence.  She will follow-up with her medical oncologist, Dr. Burr Medico in 3 months with history and physical exam per surveillance protocol.  She will continue her anti-estrogen therapy with anastrozole. Thus far she has moderate side effects such as hair loss, joint pain, hot flash, and fatigue but prefers to continue for now.  She was instructed to make Dr. Burr Medico or myself aware if she begins to experience any worsening side effects of the medication and I could see her back in clinic to help manage those side effects, as needed. Today, a comprehensive survivorship care plan and treatment summary was reviewed with the patient today detailing her breast cancer diagnosis, treatment course, potential late/long-term effects of treatment, appropriate follow-up care with recommendations for the future, and patient education resources.  A copy of this summary, along with a letter will be sent to the patient's primary care provider via mail/fax/In Basket message after today's visit.    2.  AI side effects: She has bilateral joint pain in the knees, hair loss, hot flash, and fatigue.  She is on citalopram for anxiety/depression which is mostly effective.  She is sensitive to medication prefers not to change her regimen at this time.  Discussed alternative options such as exemestane or tamoxifen.  She will let me know if side effects become severe.  3. Bone health:  Given Ms. Manahan's age/history of breast cancer and her current treatment regimen including anti-estrogen therapy with anastrozole, she is at risk for bone demineralization.  Her last DEXA scan was 12/2019, which showed T score  -1.6 at the left femur neck,  osteopenia.  I encouraged her to begin once daily calcium and vitamin D and increase her weight-bearing activities.  She was given education on specific activities to promote bone health.  4. Cancer screening:  Due to Ms. Steinhaus's history and her age, she should receive screening for skin cancers, colon cancer, and gynecologic cancers.  She has a history of melanoma.  She is a non-smoker, does not qualify for lung cancer screening CT. The information and recommendations are listed on the patient's comprehensive care plan/treatment summary and were reviewed in detail with the patient.   5. Health maintenance and wellness promotion: Ms. Mihalko was encouraged to consume 5-7 servings of fruits and vegetables per day. We reviewed the "Nutrition Rainbow" handout, as well as the handout "Take Control of Your Health and Reduce Your Cancer Risk" from the [place].  She was also encouraged to engage in moderate to vigorous exercise for 30 minutes per day most days of the week. We discussed the [employer] fitness program, which is designed for cancer survivors to help them become more physically fit after cancer treatments.  She was instructed to limit her alcohol consumption and continue to abstain from tobacco use.    6. Support services/counseling: It is not uncommon for this period of the patient's cancer care trajectory to be one of many emotions and stressors.  We discussed an opportunity for her to participate in the next session of [facility] ("Finding Your New Normal") support group series designed for patients after they have completed treatment.   Ms. Beam was encouraged to take advantage of our many other support services programs, support groups, and/or counseling in coping with her new life as a cancer survivor after completing anti-cancer treatment.  She was offered support today through active listening and expressive supportive counseling.  She was given  information regarding our available services and encouraged to contact me with any questions or for help enrolling in any of our support group/programs.    Dispo:   -Return to cancer center in 3 months -Mammogram due in 12/2020 -Follow up with surgery in 6 months -She is welcome to return back to the Survivorship Clinic at any time; no additional follow-up needed at this time.  -Consider referral back to survivorship as a long-term survivor for continued surveillance  Orders Placed This Encounter  Procedures  . MM DIAG BREAST TOMO BILATERAL    Standing Status:   Future    Standing Expiration Date:   09/26/2021    Order Specific Question:   Reason for Exam (SYMPTOM  OR DIAGNOSIS REQUIRED)    Answer:   h/o right breast cancer s/p lumpectomy and radiation    Order Specific Question:   Is the patient pregnant?    Answer:   No    Order Specific Question:   Preferred imaging location?    Answer:   [facility]    A total of (30) minutes of face-to-face time was spent with this patient with greater than 50% of that time in counseling and care-coordination.   Cira Rue, NP Survivorship Program [facility] [phone]   Note: PRIMARY CARE PROVIDER [name], [name], [place] [phone]

## 2020-10-03 ENCOUNTER — Other Ambulatory Visit: Payer: Self-pay

## 2020-10-03 ENCOUNTER — Ambulatory Visit: Payer: BC Managed Care – PPO | Admitting: Physical Therapy

## 2020-10-03 ENCOUNTER — Encounter: Payer: Self-pay | Admitting: Physical Therapy

## 2020-10-03 DIAGNOSIS — L599 Disorder of the skin and subcutaneous tissue related to radiation, unspecified: Secondary | ICD-10-CM

## 2020-10-03 NOTE — Therapy (Signed)
[place], [place], [address] Phone: [phone]   Fax:  [phone]  Physical Therapy Treatment  Patient Details  Name: Jean Morgan MRN: [id number] Date of Birth: 1961-07-31 Referring Provider (PT): [name]   Encounter Date: 10/03/2020   PT End of Session - 10/03/20 0959    Visit Number 24    Number of Visits 24    Date for PT Re-Evaluation 10/03/20    PT Start Time 0910    PT Stop Time 0958    PT Time Calculation (min) 48 min    Activity Tolerance Patient tolerated treatment well    Behavior During Therapy [facility] for tasks assessed/performed           Past Medical History:  Diagnosis Date  . Anxiety   . Cancer ([place])    skin  . Depression   . Family history of adverse reaction to anesthesia    brother had severe agitation coming out of anesthesia, had to be re-intubated  . Family history of breast cancer   . Family history of colon cancer   . Family history of leukemia   . Family history of ovarian cancer   . Family history of prostate cancer   . Family history of skin cancer   . Heart murmur    "slight"  . Hypothyroidism   . Optic neuritis   . 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 BREAST LUMPECTOMY WITH RADIOACTIVE SEED AND SENTINEL LYMPH NODE BIOPSY Right 04/19/2020   Procedure: RIGHT BREAST LUMPECTOMY WITH RADIOACTIVE SEED AND SENTINEL LYMPH NODE BIOPSY;  Surgeon: [name], MD;  Location: [facility];  Service: General;  Laterality: Right;  PEC BLOCK  . DENTAL SURGERY    . MELANOMA EXCISION  2006   Right forearm  . SENTINEL NODE BIOPSY Right 2006  . TONSILLECTOMY    . TONSILLECTOMY      There were no vitals filed for this visit.   Subjective Assessment - 10/03/20 0911    Subjective The spot are back and they are coming back some one the side.    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 Radiation completed   has started anastrozole    Patient Stated Goals to get rid of the tightness in her breast    Currently in Pain? No/denies    Pain Score 0-No pain                             [facility] Adult PT Treatment/Exercise - 10/03/20 0001      Manual Therapy   Soft tissue mobilization along area of fibrosis at inferior breast just inferior to nipple and along scar line and along right pec     Manual Lymphatic Drainage (MLD) In supine: short neck, superficial and deep abdominals, right inguinal nodes and establishment of axillo inguinal pathway, Lt axillary  nodes and establishment of interaxillary pathway, R breast with focus on inferior breast in area of fibrosis moving fluid towards pathways finished retracing steps in supine redirecting fluid to pathways and nodes  PT Long Term Goals - 07/10/20 1510      PT LONG TERM GOAL #1   Title Pt will be independent in a home exercise program for continued strengthening and stretching.    Time 4    Period Weeks    Status On-going      PT LONG TERM GOAL #2   Title Pt will report a 75% improvement in R neck and upper shoulder pain to allow improved comfort    Baseline 07/10/20- 75% improvement    Time 4    Period Weeks    Status Achieved      PT LONG TERM GOAL #3   Title Pt will report a 75% improvement in R inferior breast pain and swelling to allow improved comfort and to allow pt to receive radiation    Baseline 07/10/20- 25% improvement    Time 4    Period Weeks    Status On-going      PT LONG TERM GOAL #4   Title Pt will demonstrate 170 degrees of right shoulder flexion and abduction to allow her to return to prior level of function    Baseline flexion 150, abduction 158; 07/10/20- flexion 170, abduction 179     Time 4    Period Weeks    Status Achieved      PT LONG TERM GOAL #5   Title There will be a 50% improvement in scar tissue to allow improved comfort.    Time 4    Period Weeks    Status New    Target Date 08/07/20                 Plan - 10/03/20 1000    Clinical Impression Statement Pt returns to PT with continued fibrosis in right breast though there is some improvement today. The fibrosis in her inferior breast is not as hard as it was last session. There is an area of increased thickness at lateral inferior breast. Both areas responded to treatment today and softened. Plan is to see pt one more time for fibrosis in right breast and then update her POC to include vestibular rehab since she has been having dizziness with position changes.    PT Frequency 2x / week    PT Duration 4 weeks    PT Treatment/Interventions ADLs/Self Care Home Management;Patient/family education;Manual techniques;Therapeutic exercise;Manual lymph drainage;Compression bandaging;Taping;Scar mobilization;Passive range of motion;Vasopneumatic Device    PT Next Visit Plan update POC and include vestibular tx, continue STM/MLD for lateral and inferior breast fibrosis - advise on vestib rehab    PT Home Exercise Plan post op breast; self MLD, supine scapular series, lying over foam roll    Consulted and Agree with Plan of Care Patient           Patient will benefit from skilled therapeutic intervention in order to improve the following deficits and impairments:  Postural dysfunction, Pain, Increased edema, Decreased scar mobility, Increased fascial restricitons  Visit Diagnosis: Disorder of the skin and subcutaneous tissue related to radiation, unspecified     Problem List Patient Active Problem List   Diagnosis Date Noted  . PAD (peripheral artery disease) ([place]) 07/09/2020  . Carpal tunnel syndrome 03/09/2020  . Genetic testing 02/29/2020  . Family history of breast cancer   . Family history of  ovarian cancer   . Family history of colon cancer   . Family history of prostate cancer   . Family history of skin cancer   . Family history of  leukemia   . Malignant neoplasm of lower-outer quadrant of right breast of female, estrogen receptor positive ([place]) 02/02/2020  . Osteopenia 01/25/2020  . B12 deficiency 01/04/2020  . Hearing loss 08/18/2018  . Hyperlipidemia 11/28/2016  . Anxiety and depression 11/28/2016  . Chronic fatigue 11/28/2016  . Recurrent genital herpes 11/28/2016  . Melanoma of skin, site unspecified 06/26/2013    Allyson Sabal [facility] 10/03/2020, 10:02 AM  [place], [place], [address] Phone: [phone]   Fax:  [phone]  Name: Deshana Rominger MRN: [id number] Date of Birth: 12-27-1960  Manus Gunning, PT 10/03/20 10:02 AM

## 2020-10-10 ENCOUNTER — Encounter: Payer: Self-pay | Admitting: Physical Therapy

## 2020-10-18 ENCOUNTER — Encounter: Payer: Self-pay | Admitting: Rehabilitation

## 2020-10-18 ENCOUNTER — Ambulatory Visit: Payer: BC Managed Care – PPO | Admitting: Rehabilitation

## 2020-10-18 ENCOUNTER — Other Ambulatory Visit: Payer: Self-pay

## 2020-10-18 DIAGNOSIS — R42 Dizziness and giddiness: Secondary | ICD-10-CM

## 2020-10-18 NOTE — Therapy (Signed)
[facility] [address] [place], [place], [address] Phone: [phone]   Fax:  [phone]  Physical Therapy Treatment  Patient Details  Name: Jean Morgan MRN: [id number] Date of Birth: 09/02/1961 Referring Provider (PT): Dr. Toth   Encounter Date: 10/18/2020   PT End of Session - 10/18/20 1205    Visit Number 25    PT Start Time 1106    PT Stop Time 1153    PT Time Calculation (min) 47 min    Activity Tolerance Patient tolerated treatment well    Behavior During Therapy WFL for tasks assessed/performed           Past Medical History:  Diagnosis Date  . Anxiety   . Cancer (HCC)    skin  . Depression   . Family history of adverse reaction to anesthesia    brother had severe agitation coming out of anesthesia, had to be re-intubated  . Family history of breast cancer   . Family history of colon cancer   . Family history of leukemia   . Family history of ovarian cancer   . Family history of prostate cancer   . Family history of skin cancer   . Heart murmur    "slight"  . Hypothyroidism   . Optic neuritis   . 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 BREAST LUMPECTOMY WITH RADIOACTIVE SEED AND SENTINEL LYMPH NODE BIOPSY Right 04/19/2020   Procedure: RIGHT BREAST LUMPECTOMY WITH RADIOACTIVE SEED AND SENTINEL LYMPH NODE BIOPSY;  Surgeon: Toth, Paul III, MD;  Location: [facility] OR;  Service: General;  Laterality: Right;  PEC BLOCK  . DENTAL SURGERY    . MELANOMA EXCISION  2006   Right forearm  . SENTINEL NODE BIOPSY Right 2006  . TONSILLECTOMY    . TONSILLECTOMY      There were no vitals filed for this visit.   Subjective Assessment - 10/18/20 1107    Subjective When I bending over and lying down at night are what make me dizzy.  Some motion insensitivity.    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 Toth Radiation completed   has started anastrozole    Currently in Pain? No/denies                   Vestibular Assessment - 10/18/20 0001      Symptom Behavior   Subjective history of current problem 3 weeks new no history    Type of Dizziness  Spinning    Frequency of Dizziness every day    Duration of Dizziness lasts a few seconds    Symptom Nature Motion provoked;Positional;Spontaneous    Aggravating Factors Lying supine;Moving eyes;Turning body quickly;Turning head quickly;Walking in a crowd;Supine to sit;Sit to stand;Forward bending;Rolling to right;Rolling to left    Relieving Factors Head stationary    Progression of Symptoms Worse      Oculomotor Exam   Oculomotor Alignment Normal    Ocular ROM WNL    Spontaneous Absent    Gaze-induced  Absent    Head shaking Horizontal Absent    Head Shaking Vertical Absent    Smooth Pursuits Intact   one end range jump with gaze right   Saccades Intact        Vestibulo-Ocular Reflex   VOR 1 Head Only (x 1 viewing) WNL mild symtpoms only    VOR Cancellation Normal    Comment slight trouble with side to side fixation but no dizziness      Other Tests   Comments head thrust negative bil      Positional Testing   Dix-Hallpike Dix-Hallpike Right;Dix-Hallpike Left    Sidelying Test Sidelying Right;Sidelying Left      Dix-Hallpike Right   Dix-Hallpike Right Duration neg      Dix-Hallpike Left   Dix-Hallpike Left Duration neg      Sidelying Right   Sidelying Right Duration neg      Sidelying Left   Sidelying Left Duration neg      Positional Sensitivities   Sit to Supine No dizziness    Supine to Left Side No dizziness    Supine to Right Side No dizziness    Supine to Sitting No dizziness    Right Hallpike No dizziness    Up from Right Hallpike No dizziness    Up from Left Hallpike No  dizziness    Nose to Right Knee No dizziness    Right Knee to Sitting No dizziness    Nose to Left Knee No dizziness    Left Knee to Sitting No dizziness    Head Turning x 5 No dizziness    Head Nodding x 5 No dizziness    Pivot Right in Standing No dizziness    Pivot Left in Standing No dizziness    Rolling Right No dizziness    Rolling Left No dizziness                     Vestibular Treatment/Exercise - 10/18/20 0001      Vestibular Treatment/Exercise   Vestibular Treatment Provided Habituation;Gaze    Habituation Exercises Nestor Lewandowsky    Gaze Exercises X1 Viewing Horizontal;X1 Viewing Vertical;X2 Viewing Horizontal;X2 Viewing Vertical;Eye/Head Exercise Horizontal      Nestor Lewandowsky   Number of Reps  5    Symptom Description  education on how to perform at home 5 reps 2-3x per day for at least 14 days      X1 Viewing Horizontal   Foot Position seated    Comments 30-60seconds      X1 Viewing Vertical   Foot Position seated    Comments 30-60seconds      X2 Viewing Horizontal    Comments seated      X2 Viewing Vertical   Comments seated      Eye/Head Exercise Horizontal   Comments seated                 PT Education - 10/18/20 1204    Education Details brandt daroff exercises, x1 and x 2 viewing and eye head horizonal HEP    Person(s) Educated Patient    Methods Explanation;Demonstration;Tactile cues;Verbal cues;Handout    Comprehension Verbalized understanding            PT Short Term Goals - 10/18/20 1207      PT SHORT TERM GOAL #1   Title Pt will be ind with vestibular exercises for habituation    Time 1    Period Days    Status Achieved             PT Long Term Goals - 07/10/20 1510      PT LONG TERM GOAL #1   Title Pt will be independent in a home exercise program for  continued strengthening and stretching.    Time 4    Period Weeks    Status On-going      PT LONG TERM GOAL #2   Title Pt will report a 75% improvement  in R neck and upper shoulder pain to allow improved comfort    Baseline 07/10/20- 75% improvement    Time 4    Period Weeks    Status Achieved      PT LONG TERM GOAL #3   Title Pt will report a 75% improvement in R inferior breast pain and swelling to allow improved comfort and to allow pt to receive radiation    Baseline 07/10/20- 25% improvement    Time 4    Period Weeks    Status On-going      PT LONG TERM GOAL #4   Title Pt will demonstrate 170 degrees of right shoulder flexion and abduction to allow her to return to prior level of function    Baseline flexion 150, abduction 158; 07/10/20- flexion 170, abduction 179    Time 4    Period Weeks    Status Achieved      PT LONG TERM GOAL #5   Title There will be a 50% improvement in scar tissue to allow improved comfort.    Time 4    Period Weeks    Status New    Target Date 08/07/20                 Plan - 10/18/20 1205    Clinical Impression Statement Pt returns to evaluate vertigo symptoms that have been happening x 3 weeks.  On assessment today no dizziness was provoked during [place] positions as well as during dix hallpike and horizontal canal testing.  Pt had no noted nystagmus and WNL on foam with EC testing.  Pt was given Nestor Lewandowsky exercises for home due to vague BPPV like symptoms without reproduction and x1/x2 viewing for complaints of some sensitivity with driving.  Pt will follow up if this does not help or if things worsen.    Consulted and Agree with Plan of Care Patient           Patient will benefit from skilled therapeutic intervention in order to improve the following deficits and impairments:     Visit Diagnosis: Dizziness and giddiness     Problem List Patient Active Problem List   Diagnosis Date Noted  . PAD (peripheral artery disease) ([place]) 07/09/2020  . Carpal tunnel syndrome 03/09/2020  . Genetic testing 02/29/2020  . Family history of breast cancer   . Family history of ovarian cancer    . Family history of colon cancer   . Family history of prostate cancer   . Family history of skin cancer   . Family history of leukemia   . Malignant neoplasm of lower-outer quadrant of right breast of female, estrogen receptor positive ([place]) 02/02/2020  . Osteopenia 01/25/2020  . B12 deficiency 01/04/2020  . Hearing loss 08/18/2018  . Hyperlipidemia 11/28/2016  . Anxiety and depression 11/28/2016  . Chronic fatigue 11/28/2016  . Recurrent genital herpes 11/28/2016  . Melanoma of skin, site unspecified 06/26/2013    Stark Bray 10/18/2020, 12:09 PM  [facility] [place], [place], [address] Phone: [phone]   Fax:  [phone]  Name: Jean Morgan MRN: [id number] Date of Birth: 1960-11-03

## 2020-10-23 ENCOUNTER — Other Ambulatory Visit: Payer: Self-pay | Admitting: Hematology

## 2020-10-23 ENCOUNTER — Encounter: Payer: BC Managed Care – PPO | Admitting: Rehabilitation

## 2020-10-23 ENCOUNTER — Other Ambulatory Visit: Payer: Self-pay | Admitting: Family Medicine

## 2020-10-23 DIAGNOSIS — F419 Anxiety disorder, unspecified: Secondary | ICD-10-CM

## 2020-11-02 ENCOUNTER — Encounter: Payer: BC Managed Care – PPO | Admitting: Rehabilitation

## 2020-11-06 ENCOUNTER — Other Ambulatory Visit: Payer: Self-pay | Admitting: Nurse Practitioner

## 2020-11-06 DIAGNOSIS — Z9889 Other specified postprocedural states: Secondary | ICD-10-CM

## 2020-11-14 ENCOUNTER — Other Ambulatory Visit: Payer: Self-pay | Admitting: Hematology

## 2020-11-14 DIAGNOSIS — Z9889 Other specified postprocedural states: Secondary | ICD-10-CM

## 2020-11-16 ENCOUNTER — Encounter: Payer: Self-pay | Admitting: Family Medicine

## 2020-12-17 NOTE — Progress Notes (Signed)
[facility]   Telephone:([phone] Fax:([phone]   Clinic Follow up Note   Patient Care Team: [name], [name], MD as PCP - General (Family Medicine) Rolm Bookbinder, MD as Consulting Physician (Dermatology) [name], RN as Oncology Nurse Navigator [name], RN as Oncology Nurse Navigator [name], MD as Consulting Physician (General Surgery) Truitt Merle, MD as Consulting Physician (Hematology) [name], MD as Consulting Physician (Radiation Oncology) Alla Feeling, NP as Nurse Practitioner (Nurse Practitioner)  Date of Service:  12/19/2020  CHIEF COMPLAINT: F/u of right breast cancer  SUMMARY OF ONCOLOGIC HISTORY: Oncology History Overview Note  Cancer Staging Malignant neoplasm of lower-outer quadrant of right breast of female, estrogen receptor positive ([place]) Staging form: Breast, AJCC 8th Edition - Clinical stage from 01/31/2020: Stage IB (cT2, cN0, cM0, G2, ER+, PR+, HER2-) - Signed by Truitt Merle, MD on 02/07/2020    Malignant neoplasm of lower-outer quadrant of right breast of female, estrogen receptor positive ([place])  01/23/2020 Imaging   DEXA 01/23/20  Osteopenia with lowest T-score -1.6 at left femur neck  Site Region Measured Date Measured Age YA BMD Significant CHANGE T-score DualFemur Neck Left  01/23/2020    60.1         -1.6    0.815 g/cm2   AP Spine  L1-L4 (L3) 01/23/2020    60.1         -1.1    1.035 g/cm2   DualFemur Total Mean 01/23/2020    60.1         -1.2    0.851 g/cm2   01/23/2020 Mammogram   Diangostic Mammogram 01/23/20  IMPRESSION: 1. There is a highly suspicious mass in the right breast at 6 o'clock 3 cm from the nipple demonstrates an irregular hypoechoic shadowing mass measuring 2.3 x 1.4 x 1.7 cm associated with pleomorphic calcifications.   2.  No evidence of right axillary lymphadenopathy.   3. There is a benign-appearing cyst in the superior left breast at 12:30, 3 cm from the nipple demonstrates an  anechoic oval circumscribed mass measuring 0.9 x 0.8 x 0.9 cm. No evidence of left breast malignancy.    01/31/2020 Cancer Staging   Staging form: Breast, AJCC 8th Edition - Clinical stage from 01/31/2020: Stage IB (cT2, cN0, cM0, G2, ER+, PR+, HER2-) - Signed by Truitt Merle, MD on 02/07/2020   01/31/2020 Initial Biopsy   Diagnosis 01/31/20 Breast, right, needle core biopsy, 6 o'clock - INVASIVE MAMMARY CARCINOMA AND MAMMARY CARCINOMA IN SITU WITH PERINEURAL INVASION AND CALCIFICATIONS. Microscopic Comment The carcinoma is nuclear grade 2. The greatest linear extent of tumor in any one core is 16 mm. Immunohistochemistry of E-cadherin will be reported separately. Ancillary studies will be reported separately.   01/31/2020 Receptors her2   PROGNOSTIC INDICATORS Results: IMMUNOHISTOCHEMICAL AND MORPHOMETRIC ANALYSIS PERFORMED MANUALLY The tumor cells are NEGATIVE for Her2 (1+). Estrogen Receptor: 100%, POSITIVE, STRONG STAINING INTENSITY Progesterone Receptor: 80%, POSITIVE, STRONG STAINING INTENSITY Proliferation Marker Ki67: 15%   01/31/2020 Oncotype testing   Low risk with recurrence score 17.  Distant risk of recurrence at 9 years 5% with AI or Tamoxifen. There is less than 1% benefit of chemotherapy.    02/02/2020 Initial Diagnosis   Malignant neoplasm of lower-outer quadrant of right breast of female, estrogen receptor positive ([place])   02/17/2020 Imaging   Breast MRI  IMPRESSION: Known malignancy in the LOWER central portion of the RIGHT breast measuring 3.1 centimeters.   No adenopathy in the RIGHT  axilla.   No additional areas of concern are identified in either breast.     02/27/2020 -  Neo-Adjuvant Anti-estrogen oral therapy   Anastrozole 24m once daily starting in 02/27/20 with half tablet to see if tolerable.    02/28/2020 Genetic Testing   Negative genetic testing:  No pathogenic variants detected on the [facility] Multi-Cancer Panel. Two variants of uncertain significance were  detected - one in the HOXB13 gene called c.215G>T and one in the RNF43 gene called c.2139A>T. The report date is 02/28/2020.  The Multi-Cancer Panel offered by [facility] includes sequencing and/or deletion duplication testing of the following 85 genes: AIP, ALK, APC, ATM, AXIN2,BAP1,  BARD1, BLM, BMPR1A, BRCA1, BRCA2, BRIP1, CASR, CDC73, CDH1, CDK4, CDKN1B, CDKN1C, CDKN2A (p14ARF), CDKN2A (p16INK4a), CEBPA, CHEK2, CTNNA1, DICER1, DIS3L2, EGFR (c.2369C>T, p.Thr790Met variant only), EPCAM (Deletion/duplication testing only), FH, FLCN, GATA2, GPC3, GREM1 (Promoter region deletion/duplication testing only), HOXB13 (c.251G>A, p.Gly84Glu), HRAS, KIT, MAX, MEN1, MET, MITF (c.952G>A, p.Glu318Lys variant only), MLH1, MSH2, MSH3, MSH6, MUTYH, NBN, NF1, NF2, NTHL1, PALB2, PDGFRA, PHOX2B, PMS2, POLD1, POLE, POT1, PRKAR1A, PTCH1, PTEN, RAD50, RAD51C, RAD51D, RB1, RECQL4, RET, RNF43, RUNX1, SDHAF2, SDHA (sequence changes only), SDHB, SDHC, SDHD, SMAD4, SMARCA4, SMARCB1, SMARCE1, STK11, SUFU, TERC, TERT, TMEM127, TP53, TSC1, TSC2, VHL, WRN and WT1.    04/19/2020 Surgery   RIGHT BREAST LUMPECTOMY WITH RADIOACTIVE SEED AND SENTINEL LYMPH NODE BIOPSY by Dr [name]   04/19/2020 Pathology Results   FINAL MICROSCOPIC DIAGNOSIS:   A. BREAST, RIGHT, LUMPECTOMY:  -  Invasive ductal carcinoma, Nottingham grade 2 of 3, 2.2 cm  -  Ductal carcinoma in-situ  -  Calcifications associated with carcinoma  -  Margins uninvolved by carcinoma (< 0.1 cm; posterior margin)  -  Previous biopsy site changes present  -  See oncology table and comment below   B. LYMPH NODE, RIGHT AXILLARY #1, SENTINEL, BIOPSY:  -  No carcinoma identified in one lymph node (0/1)  -  See comment   C. LYMPH NODE, RIGHT AXILLARY, SENTINEL, BIOPSY:  -  No carcinoma identified in one lymph node (0/1)  -  See comment   D. LYMPH NODE, RIGHT AXILLARY #2, SENTINEL, BIOPSY:  -  No carcinoma identified in one lymph node (0/1)  -  See comment   COMMENT:   A.   The previous biopsy and E-cadherin immunohistochemistry  (([id number] was reviewed in conjunction with this case and supports  the above interpretation.   B-D. Given the lobular-like morphology, cytokeratin AE1/3 was performed  on the sentinel lymph nodes to exclude micrometastasis.  There is no  evidence of metastatic carcinoma by immunohistochemistry.    05/28/2020 - 06/22/2020 Radiation Therapy   Adjuvant Radiation with Dr MLisbeth Renshaw   09/26/2020 Survivorship   SCP delivered by LCira Rue NP       CURRENT THERAPY:  Anastrozole 168monce daily starting in 02/27/20.   INTERVAL HISTORY:  Jean Morgan here for a follow up of right breast cancer. She was last seen by me 6 months ago and seen by NP Lacie 2 months ago in interim. She presents to the clinic alone. She notes she has side effects from anastrozole. She notes hair thinning, low libido, dry eyes and dry skin. She notes this is manageable but annoyance. She notes her hot flashes are mild twice a day. She notes her interrupted sleep is not new to her. She notes her joint stiffness is tolerable. She notes itching around her areola and tenderness around her incision. She notes  this chest tenderness is newer since her surgery. She wonders what supplements she can use for her heart health and digestive health.    REVIEW OF SYSTEMS:   Constitutional: Denies fevers, chills or abnormal weight loss (+) hot flashes  Eyes: Denies blurriness of vision (+) Dry eye  Ears, nose, mouth, throat, and face: Denies mucositis or sore throat Respiratory: Denies cough, dyspnea or wheezes Cardiovascular: Denies palpitation, chest discomfort or lower extremity swelling Gastrointestinal:  Denies nausea, heartburn or change in bowel habits Skin: Denies abnormal skin rashes (+) Skin dryness, hair thinning  Lymphatics: Denies new lymphadenopathy or easy bruising MSK: (+) joint pain  Neurological:Denies numbness, tingling or new  weaknesses Behavioral/Psych: Mood is stable, no new changes  All other systems were reviewed with the patient and are negative.  MEDICAL HISTORY:  Past Medical History:  Diagnosis Date  . Anxiety   . Cancer ([place])    skin  . Depression   . Family history of adverse reaction to anesthesia    brother had severe agitation coming out of anesthesia, had to be re-intubated  . Family history of breast cancer   . Family history of colon cancer   . Family history of leukemia   . Family history of ovarian cancer   . Family history of prostate cancer   . Family history of skin cancer   . Heart murmur    "slight"  . Hypothyroidism   . Optic neuritis   . Rocky Mountain spotted fever    1970's    SURGICAL HISTORY: Past Surgical History:  Procedure Laterality Date  . BREAST LUMPECTOMY WITH RADIOACTIVE SEED AND SENTINEL LYMPH NODE BIOPSY Right 04/19/2020   Procedure: RIGHT BREAST LUMPECTOMY WITH RADIOACTIVE SEED AND SENTINEL LYMPH NODE BIOPSY;  Surgeon: [name], MD;  Location: [facility];  Service: General;  Laterality: Right;  PEC BLOCK  . DENTAL SURGERY    . MELANOMA EXCISION  2006   Right forearm  . SENTINEL NODE BIOPSY Right 2006  . TONSILLECTOMY    . TONSILLECTOMY      I have reviewed the social history and family history with the patient and they are unchanged from previous note.  ALLERGIES:  is allergic to latex and oxycodone.  MEDICATIONS:  Current Outpatient Medications  Medication Sig Dispense Refill  . anastrozole (ARIMIDEX) 1 MG tablet TAKE 1 TABLET BY MOUTH EVERY DAY 90 tablet 2  . Calcium-Vitamin D-Vitamin K [id number] MG-UNT-MCG CHEW Chew 2 each by mouth daily.    . cetirizine (ZYRTEC) 5 MG tablet Take 5 mg by mouth daily as needed for allergies.    . citalopram (CELEXA) 20 MG tablet TAKE 1 TABLET BY MOUTH EVERY DAY 90 tablet 1  . diazepam (VALIUM) 5 MG tablet Take 0.5-1 tablets (2.5-5 mg total) by mouth every 12 (twelve) hours as needed for anxiety. Take 1-2 tabs 15  min before procedures. 40 tablet 1  . valACYclovir (VALTREX) 1000 MG tablet TAKE 1 TABLET ONCE DAILY FOR 5 DAYS AS NEEDED, START ASAP WITHIN 48 HOURS OF ONSET OF SYMPTOMS 90 tablet 1   No current facility-administered medications for this visit.    PHYSICAL EXAMINATION: ECOG PERFORMANCE STATUS: 0 - Asymptomatic  Vitals:   12/19/20 0847  BP: 131/78  Pulse: 71  Resp: 18  Temp: 98.5 F (36.9 C)  SpO2: 100%   Filed Weights   12/19/20 0847  Weight: 146 lb 12.8 oz (66.6 kg)    GENERAL:alert, no distress and comfortable SKIN: skin color, texture, turgor are  normal, no rashes or significant lesions (+) Skin tag on right breast (+) Scarring from h/o of skin cancer on right arm.  EYES: normal, Conjunctiva are pink and non-injected, sclera clear  NECK: supple, thyroid normal size, non-tender, without nodularity LYMPH:  no palpable lymphadenopathy in the cervical, axillary  LUNGS: clear to auscultation and percussion with normal breathing effort HEART: regular rate & rhythm and no murmurs and no lower extremity edema ABDOMEN:abdomen soft, non-tender and normal bowel sounds Musculoskeletal:no cyanosis of digits and no clubbing  NEURO: alert & oriented x 3 with fluent speech, no focal motor/sensory deficits BREAST: S/p right breast lumpectomy: Surgical incision healed well. Breast symmetrical. No palpable mass, nodules or adenopathy bilaterally. Breast exam benign.   LABORATORY DATA:  I have reviewed the data as listed CBC Latest Ref Rng & Units 12/19/2020 04/13/2020 02/08/2020  WBC 4.0 - 10.5 K/uL 7.6 8.8 9.2  Hemoglobin 12.0 - 15.0 g/dL 13.3 13.7 12.8  Hematocrit 36.0 - 46.0 % 39.5 42.3 39.5  Platelets 150 - 400 K/uL 266 325 286     CMP Latest Ref Rng & Units 12/19/2020 04/13/2020 02/08/2020  Glucose 70 - 99 mg/dL 93 93 113(H)  BUN 8 - 23 mg/dL '12 11 11  ' Creatinine 0.44 - 1.00 mg/dL 0.77 0.78 0.82  Sodium 135 - 145 mmol/L 140 142 142  Potassium 3.5 - 5.1 mmol/L 3.9 3.9 4.1  Chloride  98 - 111 mmol/L 107 105 108  CO2 22 - 32 mmol/L '23 26 25  ' Calcium 8.9 - 10.3 mg/dL 9.3 9.6 9.4  Total Protein 6.5 - 8.1 g/dL 7.5 - 7.5  Total Bilirubin 0.3 - 1.2 mg/dL 0.6 - 0.4  Alkaline Phos 38 - 126 U/L 81 - 85  AST 15 - 41 U/L 19 - 20  ALT 0 - 44 U/L 15 - 19      RADIOGRAPHIC STUDIES: I have personally reviewed the radiological images as listed and agreed with the findings in the report. No results found.   ASSESSMENT & PLAN:  Manie Bealer is a 60 y.o. female with    1.Malignant neoplasm of lower-outer quadrant of right breast, StageIb, p(T2N0M0), ER/PR+, HER2-, GradeII, Oncotype RS 17  -She was diagnosed in April 2021. Her biopsy sample forOncotype Dx testing showed low risk with RC 17. There isno significantbenefit of chemotherapy and I did not recommend. -She underwent right breast lumpectomy with Dr Marlou Starks on 04/19/20 and adjuvant Radiation with Dr Lisbeth Renshaw.  -She started anastrozole before surgery in 02/2020. She has multiple mild side effects, including low libido, skin and eye dryness, hair thinning, hot flashes and joint stiffness, most of them are tolerable. She is willing to continue.  -She is clinically doing well. Lab reviewed, her CBC and CMP are within normal limits. Her physical exam was unremarkable. There is no clinical concern for recurrence. -Continue surveillance. Next mammogram in 02/06/21.  -Continue anastrozole. Will continue 5-7 years.  -F/u in 6 months. She will f/u with Dr Marlou Starks in interim.    2. Comorbidities: H/o of malignant Melanomain right forearm,B12 deficiency, Depression/Anxiety  -She has B12 deficiency which causes fatigue.  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 S/p removal.  -Continue Celexa, mood stable, did not get worse after she started anastrozole.   3. Genetic Testing negative for pathogenetic mutations  4. Osteopenia -She was recently diagnosed by 12/2019 DEXA. Will repeat in 2023.  -Will monitor on AI which can  weaken her bone.  -continue Calcium. I recommend she take a calcium  with Vit D in it. I also encouraged her to do weight bearing exercise.   5. Antiestrogen side effects Management -She has multiple mild side effects from anastrozole: low libido, skin and eye dryness, hair thinning, hot flashes and joint stiffness. She is fine to continue.  -I discussed the option of Wellbutrin in place of or in addition to her Celexa to help her libido. She will think about it.  -I discussed acupuncture can help with hot flashes and joint pain. I also suggested Tumeric. She will try   PLAN: -Continue anastrozole -Mammogram on 02/06/21.  -Lab and F/u in 6 months    No problem-specific Assessment & Plan notes found for this encounter.   No orders of the defined types were placed in this encounter.  All questions were answered. The patient knows to call the clinic with any problems, questions or concerns. No barriers to learning was detected. The total time spent in the appointment was 30 minutes.     Truitt Merle, MD 12/19/2020   I, Joslyn Devon, am acting as scribe for Truitt Merle, MD.   I have reviewed the above documentation for accuracy and completeness, and I agree with the above.

## 2020-12-18 ENCOUNTER — Other Ambulatory Visit: Payer: Self-pay | Admitting: Nurse Practitioner

## 2020-12-18 DIAGNOSIS — C50511 Malignant neoplasm of lower-outer quadrant of right female breast: Secondary | ICD-10-CM

## 2020-12-19 ENCOUNTER — Other Ambulatory Visit: Payer: Self-pay

## 2020-12-19 ENCOUNTER — Telehealth: Payer: Self-pay | Admitting: Hematology

## 2020-12-19 ENCOUNTER — Encounter: Payer: Self-pay | Admitting: Hematology

## 2020-12-19 ENCOUNTER — Inpatient Hospital Stay: Payer: BC Managed Care – PPO | Attending: Nurse Practitioner | Admitting: Hematology

## 2020-12-19 ENCOUNTER — Inpatient Hospital Stay: Payer: BC Managed Care – PPO

## 2020-12-19 VITALS — BP 131/78 | HR 71 | Temp 98.5°F | Resp 18 | Ht 66.0 in | Wt 146.8 lb

## 2020-12-19 DIAGNOSIS — R6882 Decreased libido: Secondary | ICD-10-CM | POA: Diagnosis not present

## 2020-12-19 DIAGNOSIS — Z85820 Personal history of malignant melanoma of skin: Secondary | ICD-10-CM | POA: Insufficient documentation

## 2020-12-19 DIAGNOSIS — Z808 Family history of malignant neoplasm of other organs or systems: Secondary | ICD-10-CM | POA: Insufficient documentation

## 2020-12-19 DIAGNOSIS — Z79899 Other long term (current) drug therapy: Secondary | ICD-10-CM | POA: Diagnosis not present

## 2020-12-19 DIAGNOSIS — Z806 Family history of leukemia: Secondary | ICD-10-CM | POA: Diagnosis not present

## 2020-12-19 DIAGNOSIS — M2550 Pain in unspecified joint: Secondary | ICD-10-CM | POA: Diagnosis not present

## 2020-12-19 DIAGNOSIS — N6002 Solitary cyst of left breast: Secondary | ICD-10-CM | POA: Insufficient documentation

## 2020-12-19 DIAGNOSIS — M85852 Other specified disorders of bone density and structure, left thigh: Secondary | ICD-10-CM | POA: Diagnosis not present

## 2020-12-19 DIAGNOSIS — C50511 Malignant neoplasm of lower-outer quadrant of right female breast: Secondary | ICD-10-CM | POA: Diagnosis not present

## 2020-12-19 DIAGNOSIS — Z885 Allergy status to narcotic agent status: Secondary | ICD-10-CM | POA: Diagnosis not present

## 2020-12-19 DIAGNOSIS — R232 Flushing: Secondary | ICD-10-CM | POA: Diagnosis not present

## 2020-12-19 DIAGNOSIS — Z79811 Long term (current) use of aromatase inhibitors: Secondary | ICD-10-CM | POA: Insufficient documentation

## 2020-12-19 DIAGNOSIS — M2560 Stiffness of unspecified joint, not elsewhere classified: Secondary | ICD-10-CM | POA: Insufficient documentation

## 2020-12-19 DIAGNOSIS — Z170 Estrogen receptor positive status [ER+]: Secondary | ICD-10-CM | POA: Diagnosis not present

## 2020-12-19 DIAGNOSIS — Z8042 Family history of malignant neoplasm of prostate: Secondary | ICD-10-CM | POA: Insufficient documentation

## 2020-12-19 DIAGNOSIS — Z8041 Family history of malignant neoplasm of ovary: Secondary | ICD-10-CM | POA: Insufficient documentation

## 2020-12-19 DIAGNOSIS — Z803 Family history of malignant neoplasm of breast: Secondary | ICD-10-CM | POA: Insufficient documentation

## 2020-12-19 DIAGNOSIS — Z800 Family history of malignant neoplasm of digestive organs: Secondary | ICD-10-CM | POA: Diagnosis not present

## 2020-12-19 DIAGNOSIS — R6889 Other general symptoms and signs: Secondary | ICD-10-CM | POA: Diagnosis not present

## 2020-12-19 LAB — CMP (CANCER CENTER ONLY)
ALT: 15 U/L (ref 0–44)
AST: 19 U/L (ref 15–41)
Albumin: 4.3 g/dL (ref 3.5–5.0)
Alkaline Phosphatase: 81 U/L (ref 38–126)
Anion gap: 10 (ref 5–15)
BUN: 12 mg/dL (ref 8–23)
CO2: 23 mmol/L (ref 22–32)
Calcium: 9.3 mg/dL (ref 8.9–10.3)
Chloride: 107 mmol/L (ref 98–111)
Creatinine: 0.77 mg/dL (ref 0.44–1.00)
GFR, Estimated: >60 mL/min (ref >60)
Glucose, Bld: 93 mg/dL (ref 70–99)
Potassium: 3.9 mmol/L (ref 3.5–5.1)
Sodium: 140 mmol/L (ref 135–145)
Total Bilirubin: 0.6 mg/dL (ref 0.3–1.2)
Total Protein: 7.5 g/dL (ref 6.5–8.1)

## 2020-12-19 LAB — CBC WITH DIFFERENTIAL (CANCER CENTER ONLY)
Abs Immature Granulocytes: 0.01 10*3/uL (ref 0.00–0.07)
Basophils Absolute: 0 10*3/uL (ref 0.0–0.1)
Basophils Relative: 1 %
Eosinophils Absolute: 0.1 10*3/uL (ref 0.0–0.5)
Eosinophils Relative: 2 %
HCT: 39.5 % (ref 36.0–46.0)
Hemoglobin: 13.3 g/dL (ref 12.0–15.0)
Immature Granulocytes: 0 %
Lymphocytes Relative: 33 %
Lymphs Abs: 2.5 10*3/uL (ref 0.7–4.0)
MCH: 30.9 pg (ref 26.0–34.0)
MCHC: 33.7 g/dL (ref 30.0–36.0)
MCV: 91.6 fL (ref 80.0–100.0)
Monocytes Absolute: 0.4 10*3/uL (ref 0.1–1.0)
Monocytes Relative: 6 %
Neutro Abs: 4.4 10*3/uL (ref 1.7–7.7)
Neutrophils Relative %: 58 %
Platelet Count: 266 10*3/uL (ref 150–400)
RBC: 4.31 MIL/uL (ref 3.87–5.11)
RDW: 12.4 % (ref 11.5–15.5)
WBC Count: 7.6 10*3/uL (ref 4.0–10.5)
nRBC: 0 % (ref 0.0–0.2)

## 2020-12-19 NOTE — Telephone Encounter (Signed)
Scheduled follow-up appointment per 2/23 los. Patient is aware. 

## 2021-01-07 ENCOUNTER — Ambulatory Visit: Payer: BC Managed Care – PPO | Attending: General Surgery

## 2021-01-07 ENCOUNTER — Other Ambulatory Visit: Payer: Self-pay

## 2021-01-07 DIAGNOSIS — L599 Disorder of the skin and subcutaneous tissue related to radiation, unspecified: Secondary | ICD-10-CM | POA: Insufficient documentation

## 2021-01-07 NOTE — Therapy (Signed)
[place], [place], [address] Phone: [phone]   Fax:  [phone]  Physical Therapy Treatment  Patient Details  Name: Jean Morgan MRN: [id number] Date of Birth: 1961-06-20 Referring Provider (PT): [name]   Encounter Date: 01/07/2021   PT End of Session - 01/07/21 1517    Visit Number 25   # unchanged due to screen only   Number of Visits 24    Date for PT Re-Evaluation 10/03/20    PT Start Time 1504    PT Stop Time 1516    PT Time Calculation (min) 12 min    Activity Tolerance Patient tolerated treatment well    Behavior During Therapy [facility] for tasks assessed/performed           Past Medical History:  Diagnosis Date  . Anxiety   . Cancer ([place])    skin  . Depression   . Family history of adverse reaction to anesthesia    brother had severe agitation coming out of anesthesia, had to be re-intubated  . Family history of breast cancer   . Family history of colon cancer   . Family history of leukemia   . Family history of ovarian cancer   . Family history of prostate cancer   . Family history of skin cancer   . Heart murmur    "slight"  . Hypothyroidism   . Optic neuritis   . 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 BREAST LUMPECTOMY WITH RADIOACTIVE SEED AND SENTINEL LYMPH NODE BIOPSY Right 04/19/2020   Procedure: RIGHT BREAST LUMPECTOMY WITH RADIOACTIVE SEED AND SENTINEL LYMPH NODE BIOPSY;  Surgeon: [name], MD;  Location: [facility];  Service: General;  Laterality: Right;  PEC BLOCK  . DENTAL SURGERY    . MELANOMA EXCISION  2006   Right forearm  . SENTINEL NODE BIOPSY Right 2006  . TONSILLECTOMY    . TONSILLECTOMY      There were no vitals filed for this visit.   Subjective Assessment - 01/07/21 1508    Subjective Pt returns for 3 month L-Dex screens. "I got my booster shot Friday and I'm still really sore and puffy  at my Lt shoulder. 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 Radiation completed   has started anastrozole                  L-DEX FLOWSHEETS - 01/07/21 1500      L-DEX LYMPHEDEMA SCREENING   Measurement Type Unilateral    L-DEX MEASUREMENT EXTREMITY Upper Extremity    POSITION  Standing    DOMINANT SIDE Right    At Risk Side Right    BASELINE SCORE (UNILATERAL) -1.8    L-DEX SCORE (UNILATERAL) -5.9    VALUE CHANGE (UNILAT) -4.1                               PT Short Term Goals - 10/18/20 1207      PT SHORT TERM GOAL #1   Title Pt will be ind with vestibular exercises for habituation    Time 1    Period Days    Status Achieved  PT Long Term Goals - 07/10/20 1510      PT LONG TERM GOAL #1   Title Pt will be independent in a home exercise program for continued strengthening and stretching.    Time 4    Period Weeks    Status On-going      PT LONG TERM GOAL #2   Title Pt will report a 75% improvement in R neck and upper shoulder pain to allow improved comfort    Baseline 07/10/20- 75% improvement    Time 4    Period Weeks    Status Achieved      PT LONG TERM GOAL #3   Title Pt will report a 75% improvement in R inferior breast pain and swelling to allow improved comfort and to allow pt to receive radiation    Baseline 07/10/20- 25% improvement    Time 4    Period Weeks    Status On-going      PT LONG TERM GOAL #4   Title Pt will demonstrate 170 degrees of right shoulder flexion and abduction to allow her to return to prior level of function    Baseline flexion 150, abduction 158; 07/10/20- flexion 170, abduction 179    Time 4    Period Weeks    Status Achieved      PT LONG TERM GOAL #5   Title There will be a 50% improvement  in scar tissue to allow improved comfort.    Time 4    Period Weeks    Status New    Target Date 08/07/20                 Plan - 01/07/21 1519    Clinical Impression Statement Pt returns for her 3 month L-Dex screen. Her change from baseline of -4.1 is WNLs so no further treatment is required at this time except to cont every 3 month L-Dex screens which pt is agreeable to.    PT Next Visit Plan Cont every 3 month L-Dex screens for up to 2 years from her SLNB.    Consulted and Agree with Plan of Care Patient           Patient will benefit from skilled therapeutic intervention in order to improve the following deficits and impairments:     Visit Diagnosis: Disorder of the skin and subcutaneous tissue related to radiation, unspecified     Problem List Patient Active Problem List   Diagnosis Date Noted  . PAD (peripheral artery disease) ([place]) 07/09/2020  . Carpal tunnel syndrome 03/09/2020  . Genetic testing 02/29/2020  . Family history of breast cancer   . Family history of ovarian cancer   . Family history of colon cancer   . Family history of prostate cancer   . Family history of skin cancer   . Family history of leukemia   . Malignant neoplasm of lower-outer quadrant of right breast of female, estrogen receptor positive ([place]) 02/02/2020  . Osteopenia 01/25/2020  . B12 deficiency 01/04/2020  . Hearing loss 08/18/2018  . Hyperlipidemia 11/28/2016  . Anxiety and depression 11/28/2016  . Chronic fatigue 11/28/2016  . Recurrent genital herpes 11/28/2016  . Melanoma of skin, site unspecified 06/26/2013    Otelia Limes, PTA 01/07/2021, 3:22 PM  [place], [place], [address] Phone: [phone]   Fax:  [phone]  Name: Jean Morgan MRN: [id number] Date of Birth: Apr 18, 1961

## 2021-01-09 ENCOUNTER — Encounter: Payer: BC Managed Care – PPO | Admitting: Family Medicine

## 2021-01-24 ENCOUNTER — Other Ambulatory Visit: Payer: Self-pay

## 2021-01-25 ENCOUNTER — Encounter: Payer: Self-pay | Admitting: Family Medicine

## 2021-01-25 ENCOUNTER — Ambulatory Visit (INDEPENDENT_AMBULATORY_CARE_PROVIDER_SITE_OTHER): Payer: BC Managed Care – PPO | Admitting: Family Medicine

## 2021-01-25 VITALS — BP 120/70 | HR 65 | Resp 16 | Ht 66.0 in | Wt 148.2 lb

## 2021-01-25 DIAGNOSIS — Z13228 Encounter for screening for other metabolic disorders: Secondary | ICD-10-CM | POA: Diagnosis not present

## 2021-01-25 DIAGNOSIS — F419 Anxiety disorder, unspecified: Secondary | ICD-10-CM

## 2021-01-25 DIAGNOSIS — Z130 Encounter for screening for diseases of the blood and blood-forming organs and certain disorders involving the immune mechanism: Secondary | ICD-10-CM | POA: Diagnosis not present

## 2021-01-25 DIAGNOSIS — Z0000 Encounter for general adult medical examination without abnormal findings: Secondary | ICD-10-CM

## 2021-01-25 DIAGNOSIS — Z1329 Encounter for screening for other suspected endocrine disorder: Secondary | ICD-10-CM

## 2021-01-25 DIAGNOSIS — I739 Peripheral vascular disease, unspecified: Secondary | ICD-10-CM

## 2021-01-25 DIAGNOSIS — E785 Hyperlipidemia, unspecified: Secondary | ICD-10-CM | POA: Diagnosis not present

## 2021-01-25 DIAGNOSIS — E538 Deficiency of other specified B group vitamins: Secondary | ICD-10-CM

## 2021-01-25 DIAGNOSIS — R5382 Chronic fatigue, unspecified: Secondary | ICD-10-CM

## 2021-01-25 LAB — BASIC METABOLIC PANEL
BUN: 10 mg/dL (ref 6–23)
CO2: 28 mEq/L (ref 19–32)
Calcium: 9.5 mg/dL (ref 8.4–10.5)
Chloride: 103 mEq/L (ref 96–112)
Creatinine, Ser: 0.73 mg/dL (ref 0.40–1.20)
GFR: 88.91 mL/min (ref >60.00)
Glucose, Bld: 93 mg/dL (ref 70–99)
Potassium: 3.6 mEq/L (ref 3.5–5.1)
Sodium: 140 mEq/L (ref 135–145)

## 2021-01-25 LAB — LIPID PANEL
Cholesterol: 287 mg/dL — ABNORMAL HIGH (ref 0–200)
HDL: 75.6 mg/dL (ref >39.00)
LDL Cholesterol: 197 mg/dL — ABNORMAL HIGH (ref 0–99)
NonHDL: 211.8
Total CHOL/HDL Ratio: 4
Triglycerides: 75 mg/dL (ref 0.0–149.0)
VLDL: 15 mg/dL (ref 0.0–40.0)

## 2021-01-25 LAB — VITAMIN B12: Vitamin B-12: >1506 pg/mL — ABNORMAL HIGH (ref 211–911)

## 2021-01-25 LAB — TSH: TSH: 2.72 u[IU]/mL (ref 0.35–4.50)

## 2021-01-25 LAB — HEMOGLOBIN A1C: Hgb A1c MFr Bld: 5.5 % (ref 4.6–6.5)

## 2021-01-25 MED ORDER — DIAZEPAM 5 MG PO TABS: 2.5000 mg | ORAL_TABLET | Freq: Two times a day (BID) | ORAL | 1 refills | Status: DC | PRN

## 2021-01-25 NOTE — Assessment & Plan Note (Signed)
Continue non pharmacologic treatment. LDL goal at least < 100.

## 2021-01-25 NOTE — Progress Notes (Signed)
HPI: Jean Morgan is a 60 y.o. female, who is here today for her routine physical.  Last CPE: 01/04/20.  Regular exercise 3 or more time per week: Not regularly, she walks "sometiems. Following a healthful diet: She is eating more deserts but in general she tries to watch carbs and fat. She lives with her husband.  Chronic medical problems: Recurrent genital herpes,PAD,osteopenia,knee OA, right breast cancer s/p lumpectomy and radiation,B12 def,and fatigue among some.  Pap smear: 05/04/20 negative for malignancy and HPV. Hx of abnormal pap smears: Negative.  Immunization History  Administered Date(s) Administered  . PFIZER(Purple Top)SARS-COV-2 Vaccination 06/09/2020  She has not had shingrix. COVID 19 booster: Had local injection site reaction, resolved.  Mammogram: 01/23/20.Following with oncologist and breast surgeon q 6 months. Colonoscopy: 10/02/14.10 years f/u was recommended. DEXA: 01/23/20.  Hep C screening: 04/01/17 NR  Still dealing with left knee pain. Following with ortho.  B12 def: Resumed B12 supplementation a month ago, hoping it will help with fatigue.  Lab Results  Component Value Date   VITAMINB12 >1525 (H) 08/18/2018   Fatigue slowly getting better. Negative for witnessed apnea. Anxiety: She is on Valium 5 mg daily prn, she is taking medication less frequent. She is also on Celexa 20 mg daily. She still feels like medication is helping.  HLD and PAD: She is not on statin medications. Follows low fat diet.  ABI on 06/06/2020: Right: Resting right ankle-brachial index is within normal range. No evidence of significant right lower extremity arterial disease. The right toe-brachial index is abnormal. RT great toe pressure = 95 mmHg.   Left: Resting left ankle-brachial index is within normal range. No evidence of significant left lower extremity arterial disease. The left  toe-brachial index is normal. LT Great toe pressure = 133 mmHg.   Negative for  claudication.  Lab Results  Component Value Date   CHOL 259 (H) 12/29/2019   HDL 61.60 12/29/2019   LDLCALC 178 (H) 12/29/2019   TRIG 94.0 12/29/2019   CHOLHDL 4 12/29/2019   Review of Systems  Constitutional: Positive for fatigue. Negative for appetite change and fever.  HENT: Negative for hearing loss, mouth sores and sore throat.   Eyes: Negative for redness and visual disturbance.  Respiratory: Negative for cough, shortness of breath and wheezing.   Cardiovascular: Negative for chest pain and leg swelling.  Gastrointestinal: Negative for abdominal pain, nausea and vomiting.       No changes in bowel habits.  Endocrine: Negative for cold intolerance, heat intolerance, polydipsia, polyphagia and polyuria.  Genitourinary: Negative for decreased urine volume, dysuria, hematuria, vaginal bleeding and vaginal discharge.  Musculoskeletal: Positive for arthralgias. Negative for gait problem and myalgias.  Skin: Negative for color change and rash.  Allergic/Immunologic: Negative for environmental allergies.  Neurological: Negative for syncope, weakness and headaches.  Hematological: Negative for adenopathy. Does not bruise/bleed easily.  Psychiatric/Behavioral: Negative for confusion. The patient is nervous/anxious.   All other systems reviewed and are negative.   Current Outpatient Medications on File Prior to Visit  Medication Sig Dispense Refill  . anastrozole (ARIMIDEX) 1 MG tablet TAKE 1 TABLET BY MOUTH EVERY DAY 90 tablet 2  . Calcium-Vitamin D-Vitamin K [id number] MG-UNT-MCG CHEW Chew 2 each by mouth daily.    . cetirizine (ZYRTEC) 5 MG tablet Take 5 mg by mouth daily as needed for allergies.    . citalopram (CELEXA) 20 MG tablet TAKE 1 TABLET BY MOUTH EVERY DAY 90 tablet 1  . TURMERIC PO Take by  mouth.    . valACYclovir (VALTREX) 1000 MG tablet TAKE 1 TABLET ONCE DAILY FOR 5 DAYS AS NEEDED, START ASAP WITHIN 48 HOURS OF ONSET OF SYMPTOMS 90 tablet 1   No current  facility-administered medications on file prior to visit.   Past Medical History:  Diagnosis Date  . Anxiety   . Cancer ([place])    skin  . Depression   . Family history of adverse reaction to anesthesia    brother had severe agitation coming out of anesthesia, had to be re-intubated  . Family history of breast cancer   . Family history of colon cancer   . Family history of leukemia   . Family history of ovarian cancer   . Family history of prostate cancer   . Family history of skin cancer   . Heart murmur    "slight"  . Hypothyroidism   . Optic neuritis   . 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 BREAST LUMPECTOMY WITH RADIOACTIVE SEED AND SENTINEL LYMPH NODE BIOPSY Right 04/19/2020   Procedure: RIGHT BREAST LUMPECTOMY WITH RADIOACTIVE SEED AND SENTINEL LYMPH NODE BIOPSY;  Surgeon: [name], MD;  Location: [facility];  Service: General;  Laterality: Right;  PEC BLOCK  . DENTAL SURGERY    . MELANOMA EXCISION  2006   Right forearm  . SENTINEL NODE BIOPSY Right 2006  . TONSILLECTOMY    . TONSILLECTOMY      Allergies  Allergen Reactions  . Latex Itching  . Oxycodone Other (See Comments)    Other Reaction: "too strong a drug for me"    Family History  Problem Relation Age of Onset  . Colon polyps Mother        more than 10  . Colitis Mother   . Arthritis Mother   . Hyperlipidemia Mother   . Diabetes Mother   . Skin cancer Mother   . Heart disease Father   . Hyperlipidemia Father   . Hypertension Father   . Colon cancer Paternal Aunt        dx. in her 66s  . Diabetes Maternal Grandmother   . Breast cancer Maternal Grandmother        dx. >50  . Breast cancer Other 59       maternal great-aunt  . Ovarian cancer Other 44  . Colon cancer Other 65  . Skin cancer Other 6  . Prostate cancer Half-Brother 23  . Leukemia Maternal Great-grandmother 2       chronic    Social History   Socioeconomic History   . Marital status: Married    Spouse name: Not on file  . Number of children: 1  . Years of education: Not on file  . Highest education level: Not on file  Occupational History  . Not on file  Tobacco Use  . Smoking status: Never Smoker  . Smokeless tobacco: Never Used  Vaping Use  . Vaping Use: Never used  Substance and Sexual Activity  . Alcohol use: Yes    Alcohol/week: 7.0 standard drinks    Types: 7 Glasses of wine per week  . Drug use: No  . Sexual activity: Not on file  Other Topics Concern  . Not on file  Social History Narrative  . Not on file   Social Determinants of Health   Financial Resource Strain: Not on file  Food Insecurity: Not on file  Transportation Needs: Not on  file  Physical Activity: Not on file  Stress: Not on file  Social Connections: Not on file     Vitals:   01/25/21 0812  BP: 120/70  Pulse: 65  Resp: 16  SpO2: 98%   Body mass index is 23.93 kg/m.   Wt Readings from Last 3 Encounters:  01/25/21 148 lb 4 oz (67.2 kg)  12/19/20 146 lb 12.8 oz (66.6 kg)  09/26/20 146 lb 8 oz (66.5 kg)   Physical Exam Vitals and nursing note reviewed.  Constitutional:      General: She is not in acute distress.    Appearance: She is well-developed and normal weight.  HENT:     Head: Normocephalic and atraumatic.     Right Ear: Hearing, tympanic membrane, ear canal and external ear normal.     Left Ear: Hearing, tympanic membrane, ear canal and external ear normal.     Mouth/Throat:     Mouth: Mucous membranes are moist.     Pharynx: Oropharynx is clear. Uvula midline.  Eyes:     Extraocular Movements: Extraocular movements intact.     Conjunctiva/sclera: Conjunctivae normal.     Pupils: Pupils are equal, round, and reactive to light.  Neck:     Thyroid: No thyromegaly.     Trachea: No tracheal deviation.  Cardiovascular:     Rate and Rhythm: Normal rate and regular rhythm.     Pulses:          Dorsalis pedis pulses are 2+ on the right  side and 2+ on the left side.     Heart sounds: No murmur heard.   Pulmonary:     Effort: Pulmonary effort is normal. No respiratory distress.     Breath sounds: Normal breath sounds.  Abdominal:     Palpations: Abdomen is soft. There is no hepatomegaly or mass.     Tenderness: There is no abdominal tenderness.  Genitourinary:    Comments: Deferred to breast surgeon. Musculoskeletal:     Comments: No signs of synovitis appreciated.  Lymphadenopathy:     Cervical: No cervical adenopathy.  Skin:    General: Skin is warm.     Findings: No erythema or rash.  Neurological:     General: No focal deficit present.     Mental Status: She is alert and oriented to person, place, and time.     Cranial Nerves: No cranial nerve deficit.     Coordination: Coordination normal.     Gait: Gait normal.     Deep Tendon Reflexes:     Reflex Scores:      Bicep reflexes are 2+ on the right side and 2+ on the left side.      Patellar reflexes are 2+ on the right side and 2+ on the left side. Psychiatric:        Mood and Affect: Mood is anxious.     Comments: Well groomed, good eye contact.   ASSESSMENT AND PLAN:  Ms. Jean Morgan was here today annual physical examination.  Orders Placed This Encounter  Procedures  . Basic metabolic panel  . Lipid panel  . Hemoglobin A1c  . TSH  . Vitamin B12   Lab Results  Component Value Date   VITAMINB12 >1506 (H) 01/25/2021   Lab Results  Component Value Date   CREATININE 0.73 01/25/2021   BUN 10 01/25/2021   NA 140 01/25/2021   K 3.6 01/25/2021   CL 103 01/25/2021   CO2 28 01/25/2021   Lab Results  Component Value Date   CHOL 287 (H) 01/25/2021   HDL 75.60 01/25/2021   LDLCALC 197 (H) 01/25/2021   TRIG 75.0 01/25/2021   CHOLHDL 4 01/25/2021   Lab Results  Component Value Date   TSH 2.72 01/25/2021   Routine general medical examination at a health care facility We discussed the importance of regular physical activity and healthy  diet for prevention of chronic illness and/or complications. Preventive guidelines reviewed. Vaccination:Prefers to hold on Shingrix for now.  Ca++ and vit D supplementation recommended. Next CPE in a year.  The 10-year ASCVD risk score Mikey Bussing DC Brooke Bonito., et al., 2013) is: 3.4%   Values used to calculate the score:     Age: 44 years     Sex: Female     Is Non-Hispanic African American: No     Diabetic: No     Tobacco smoker: No     Systolic Blood Pressure: 568 mmHg     Is BP treated: No     HDL Cholesterol: 75.6 mg/dL     Total Cholesterol: 287 mg/dL  B12 deficiency Continue B12 supplementation. Further recommendations according to B12 result.  Chronic fatigue Exacerbated after Dx of breast cancer and with anastrozole but gradually getting closer to her baseline.  Screening for endocrine, metabolic and immunity disorder -     Basic metabolic panel -     Hemoglobin A1c  Anxiety disorder, unspecified type Stable. Continue Celexa 20 mg daily and Valium 5 mg daily prn.  -     diazepam (VALIUM) 5 MG tablet; Take 0.5-1 tablets (2.5-5 mg total) by mouth every 12 (twelve) hours as needed for anxiety.  PAD (peripheral artery disease) (HCC) Aspirin 81 mg daily recommended. Will make recommendations in regard to stain dose according to FLP results. We discussed some side effects and benefits from medications.  Hyperlipidemia Continue non pharmacologic treatment. LDL goal at least < 100.  Return in 6 months (on 07/27/2021).   London Tarnowski G. [name], MD  [facility]. [place] office.   Today you have you routine preventive visit. A few things to remember from today's visit:   Routine general medical examination at a health care facility  Hyperlipidemia, unspecified hyperlipidemia type - Plan: Lipid panel, TSH  B12 deficiency - Plan: Vitamin B12  PAD (peripheral artery disease) (HCC)  Chronic fatigue  Screening for endocrine, metabolic and immunity disorder - Plan:  Basic metabolic panel, Hemoglobin A1c  Anxiety disorder, unspecified type - Plan: diazepam (VALIUM) 5 MG tablet  If you need refills please call your pharmacy. Do not use My Chart to request refills or for acute issues that need immediate attention.   Please be sure medication list is accurate. If a new problem present, please set up appointment sooner than planned today.  At least 150 minutes of moderate exercise per week, daily brisk walking for 15-30 min is a good exercise option. Healthy diet low in saturated (animal) fats and sweets and consisting of fresh fruits and vegetables, lean meats such as fish and white chicken and whole grains.  These are some of recommendations for screening depending of age and risk factors:  - Vaccines:  Tdap vaccine every 10 years.  Shingles vaccine recommended at age 62, could be given after 61 years of age but not sure about insurance coverage.   Pneumonia vaccines: Pneumovax at 35. Sometimes Pneumovax is giving earlier if history of smoking, lung disease,diabetes,kidney disease among some.  Screening for diabetes at age 28 and every 3 years.  Cervical cancer prevention:  Pap smear starts at 61 years of age and continues periodically until 61 years old in low risk women. Pap smear every 3 years between 49 and 70 years old. Pap smear every 3-5 years between women 70 and older if pap smear negative and HPV screening negative.   -Breast cancer: Mammogram: There is disagreement between experts about when to start screening in low risk asymptomatic female but recent recommendations are to start screening at 32 and not later than 61 years old , every 1-2 years and after 61 yo q 2 years. Screening is recommended until 61 years old but some women can continue screening depending of healthy issues.  Colon cancer screening: Has been recently changed to 61 yo. Insurance may not cover until you are 61 years old. Screening is recommended until 61 years  old.  Cholesterol disorder screening at age 80 and every 3 years.N/A  Also recommended:  1. Dental visit- Brush and floss your teeth twice daily; visit your dentist twice a year. 2. Eye doctor- Get an eye exam at least every 2 years. 3. Helmet use- Always wear a helmet when riding a bicycle, motorcycle, rollerblading or skateboarding. 4. Safe sex- If you may be exposed to sexually transmitted infections, use a condom. 5. Seat belts- Seat belts can save your live; always wear one. 6. Smoke/Carbon Monoxide detectors- These detectors need to be installed on the appropriate level of your home. Replace batteries at least once a year. 7. Skin cancer- When out in the sun please cover up and use sunscreen 15 SPF or higher. 8. Violence- If anyone is threatening or hurting you, please tell your healthcare provider.  9. Drink alcohol in moderation- Limit alcohol intake to one drink or less per day. Never drink and drive. 10. Calcium supplementation 1000 to 1200 mg daily, ideally through your diet.  Vitamin D supplementation 800 units daily.

## 2021-01-25 NOTE — Assessment & Plan Note (Signed)
Aspirin 81 mg daily recommended. Will make recommendations in regard to stain dose according to FLP results. We discussed some side effects and benefits from medications.

## 2021-01-25 NOTE — Patient Instructions (Addendum)
Today you have you routine preventive visit. A few things to remember from today's visit:   Routine general medical examination at a health care facility  Hyperlipidemia, unspecified hyperlipidemia type - Plan: Lipid panel, TSH  B12 deficiency - Plan: Vitamin B12  PAD (peripheral artery disease) (HCC)  Chronic fatigue  Screening for endocrine, metabolic and immunity disorder - Plan: Basic metabolic panel, Hemoglobin A1c  Anxiety disorder, unspecified type - Plan: diazepam (VALIUM) 5 MG tablet  If you need refills please call your pharmacy. Do not use My Chart to request refills or for acute issues that need immediate attention.   Please be sure medication list is accurate. If a new problem present, please set up appointment sooner than planned today.  At least 150 minutes of moderate exercise per week, daily brisk walking for 15-30 min is a good exercise option. Healthy diet low in saturated (animal) fats and sweets and consisting of fresh fruits and vegetables, lean meats such as fish and white chicken and whole grains.  These are some of recommendations for screening depending of age and risk factors:  - Vaccines:  Tdap vaccine every 10 years.  Shingles vaccine recommended at age 63, could be given after 61 years of age but not sure about insurance coverage.   Pneumonia vaccines: Pneumovax at 31. Sometimes Pneumovax is giving earlier if history of smoking, lung disease,diabetes,kidney disease among some.  Screening for diabetes at age 63 and every 3 years.  Cervical cancer prevention:  Pap smear starts at 61 years of age and continues periodically until 61 years old in low risk women. Pap smear every 3 years between 58 and 4 years old. Pap smear every 3-5 years between women 16 and older if pap smear negative and HPV screening negative.   -Breast cancer: Mammogram: There is disagreement between experts about when to start screening in low risk asymptomatic female but  recent recommendations are to start screening at 20 and not later than 61 years old , every 1-2 years and after 61 yo q 2 years. Screening is recommended until 61 years old but some women can continue screening depending of healthy issues.  Colon cancer screening: Has been recently changed to 61 yo. Insurance may not cover until you are 61 years old. Screening is recommended until 61 years old.  Cholesterol disorder screening at age 14 and every 3 years.N/A  Also recommended:  1. Dental visit- Brush and floss your teeth twice daily; visit your dentist twice a year. 2. Eye doctor- Get an eye exam at least every 2 years. 3. Helmet use- Always wear a helmet when riding a bicycle, motorcycle, rollerblading or skateboarding. 4. Safe sex- If you may be exposed to sexually transmitted infections, use a condom. 5. Seat belts- Seat belts can save your live; always wear one. 6. Smoke/Carbon Monoxide detectors- These detectors need to be installed on the appropriate level of your home. Replace batteries at least once a year. 7. Skin cancer- When out in the sun please cover up and use sunscreen 15 SPF or higher. 8. Violence- If anyone is threatening or hurting you, please tell your healthcare provider.  9. Drink alcohol in moderation- Limit alcohol intake to one drink or less per day. Never drink and drive. 10. Calcium supplementation 1000 to 1200 mg daily, ideally through your diet.  Vitamin D supplementation 800 units daily.

## 2021-02-06 ENCOUNTER — Telehealth: Payer: Self-pay | Admitting: Nurse Practitioner

## 2021-02-06 ENCOUNTER — Telehealth: Payer: Self-pay

## 2021-02-06 ENCOUNTER — Other Ambulatory Visit: Payer: Self-pay | Admitting: Hematology

## 2021-02-06 ENCOUNTER — Other Ambulatory Visit: Payer: Self-pay

## 2021-02-06 ENCOUNTER — Ambulatory Visit
Admission: RE | Admit: 2021-02-06 | Discharge: 2021-02-06 | Disposition: A | Discharge status: Home / Self Care | Payer: BC Managed Care – PPO | Source: Ambulatory Visit | Attending: Nurse Practitioner | Admitting: Nurse Practitioner

## 2021-02-06 DIAGNOSIS — Z9889 Other specified postprocedural states: Secondary | ICD-10-CM

## 2021-02-06 DIAGNOSIS — R921 Mammographic calcification found on diagnostic imaging of breast: Secondary | ICD-10-CM

## 2021-02-06 HISTORY — DX: Personal history of irradiation: Z92.3

## 2021-02-06 NOTE — Telephone Encounter (Signed)
Jean Morgan called the clinic this morning requesting to speak with Cira Rue, NP, in regards to her most recent mammogram results. I will make Lacie aware so that she can touch base with the patient to discuss the results with her.

## 2021-02-06 NOTE — Telephone Encounter (Signed)
Spoke with patient about today's abnormal mammo showing calcifications and recommendation for biopsy scheduled 4/20. Understandably anxious, running through possible scenarios. Tried to give reassurance. Will follow path closely and call her with results. She appreciates the call.   She has a tender area on her rib beneath right breast, not very close to lumpectomy incisions but in the vicinity. No bony abnormality that she can tell. I recommend to monitor for now. She will call me if this persists.   Jean Rue, NP

## 2021-02-08 ENCOUNTER — Ambulatory Visit
Admission: RE | Admit: 2021-02-08 | Discharge: 2021-02-08 | Disposition: A | Discharge status: Home / Self Care | Payer: BC Managed Care – PPO | Source: Ambulatory Visit | Attending: Hematology | Admitting: Hematology

## 2021-02-08 ENCOUNTER — Other Ambulatory Visit: Payer: Self-pay

## 2021-02-08 ENCOUNTER — Other Ambulatory Visit: Payer: Self-pay | Admitting: Hematology

## 2021-02-08 DIAGNOSIS — R921 Mammographic calcification found on diagnostic imaging of breast: Secondary | ICD-10-CM

## 2021-02-08 DIAGNOSIS — N6310 Unspecified lump in the right breast, unspecified quadrant: Secondary | ICD-10-CM

## 2021-02-19 ENCOUNTER — Encounter: Payer: Self-pay | Admitting: Family Medicine

## 2021-02-19 ENCOUNTER — Telehealth (INDEPENDENT_AMBULATORY_CARE_PROVIDER_SITE_OTHER): Payer: BC Managed Care – PPO | Admitting: Family Medicine

## 2021-02-19 VITALS — Temp 100.1°F | Ht 66.0 in

## 2021-02-19 DIAGNOSIS — U071 COVID-19: Secondary | ICD-10-CM

## 2021-02-19 DIAGNOSIS — E785 Hyperlipidemia, unspecified: Secondary | ICD-10-CM

## 2021-02-19 DIAGNOSIS — R059 Cough, unspecified: Secondary | ICD-10-CM | POA: Diagnosis not present

## 2021-02-19 MED ORDER — BENZONATATE 100 MG PO CAPS: 200.0000 mg | ORAL_CAPSULE | Freq: Two times a day (BID) | ORAL | 0 refills | Status: AC | PRN

## 2021-02-19 MED ORDER — GUAIFENESIN-CODEINE 100-10 MG/5ML PO SOLN: 5.0000 mL | Freq: Two times a day (BID) | ORAL | 0 refills | Status: AC | PRN

## 2021-02-19 NOTE — Progress Notes (Signed)
Virtual Visit via Video Note I connected with Jean Morgan on 02/19/21 by a video enabled telemedicine application and verified that I am speaking with the correct person using two identifiers.  Location patient: home Location provider:work office Persons participating in the virtual visit: patient, provider  I discussed the limitations of evaluation and management by telemedicine and the availability of in person appointments. The patient expressed understanding and agreed to proceed.   HPI: Jean Morgan is a 60 years old female with history of PAD, hyperlipidemia, anxiety, and right breast cancer reporting negative positive COVID-19 test at home today. 3 days ago she started with nonproductive cough (occasionally clear sputum), nasal congestion, rhinorrhea, postnasal drainage, and dry throat.  She thought symptoms were caused by allergies, OTC antihistaminics did not help. + Fatigue. COVID-19 test 3 days ago was negative. Today oral temp 100.1 F. Upper and lower back achy pain, exacerbated by cough. Frontal and occipital headache. No known sick contacts or recent travel. COVID-19 vaccination: Completed.  She had her first booster on 01/04/2021. She has not noted anosmia or ageusia.  Negative for visual changes,CP, dyspnea, wheezing, abdominal pain, nausea, vomiting, changes in bowel habits, ecchymosis,urinary symptoms, or focal neurologic deficit.  A couple days ago, she noted a small erythematous papular lesion surrounded by erythema, pruritic initially, on right forearm. Not sure if it was an insect bite, no other skin rash/changes.  HLD: She is also concerned about starting statin medication an possible side effects; so she has not decided about trying atorvastatin 20 mg or Crestor 10 mg as recommended.  Lab Results  Component Value Date   CHOL 287 (H) 01/25/2021   HDL 75.60 01/25/2021   LDLCALC 197 (H) 01/25/2021   TRIG 75.0 01/25/2021   CHOLHDL 4 01/25/2021   ROS: See pertinent  positives and negatives per HPI.  Past Medical History:  Diagnosis Date  . Anxiety   . Cancer ([place])    skin  . Depression   . Family history of adverse reaction to anesthesia    brother had severe agitation coming out of anesthesia, had to be re-intubated  . Family history of breast cancer   . Family history of colon cancer   . Family history of leukemia   . Family history of ovarian cancer   . Family history of prostate cancer   . Family history of skin cancer   . Heart murmur    "slight"  . Hypothyroidism   . Optic neuritis   . Personal history of radiation therapy   . 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 BREAST BIOPSY Right 02/01/2020  . BREAST LUMPECTOMY Right 04/19/2020  . BREAST LUMPECTOMY WITH RADIOACTIVE SEED AND SENTINEL LYMPH NODE BIOPSY Right 04/19/2020   Procedure: RIGHT BREAST LUMPECTOMY WITH RADIOACTIVE SEED AND SENTINEL LYMPH NODE BIOPSY;  Surgeon: [name], MD;  Location: [facility];  Service: General;  Laterality: Right;  PEC BLOCK  . DENTAL SURGERY    . MELANOMA EXCISION  2006   Right forearm  . SENTINEL NODE BIOPSY Right 2006  . TONSILLECTOMY    . TONSILLECTOMY      Family History  Problem Relation Age of Onset  . Colon polyps Mother        more than 10  . Colitis Mother   . Arthritis Mother   . Hyperlipidemia Mother   . Diabetes Mother   . Skin cancer Mother   . Heart disease  Father   . Hyperlipidemia Father   . Hypertension Father   . Colon cancer Paternal Aunt        dx. in her 87s  . Diabetes Maternal Grandmother   . Breast cancer Maternal Grandmother        dx. >50  . Breast cancer Other 49       maternal great-aunt  . Ovarian cancer Other 34  . Colon cancer Other 71  . Skin cancer Other 60  . Prostate cancer Half-Brother 30  . Leukemia Maternal Great-grandmother 57       chronic    Social History   Socioeconomic History  . Marital status: Married    Spouse name: Not  on file  . Number of children: 1  . Years of education: Not on file  . Highest education level: Not on file  Occupational History  . Not on file  Tobacco Use  . Smoking status: Never Smoker  . Smokeless tobacco: Never Used  Vaping Use  . Vaping Use: Never used  Substance and Sexual Activity  . Alcohol use: Yes    Alcohol/week: 7.0 standard drinks    Types: 7 Glasses of wine per week  . Drug use: No  . Sexual activity: Not on file  Other Topics Concern  . Not on file  Social History Narrative  . Not on file   Social Determinants of Health   Financial Resource Strain: Not on file  Food Insecurity: Not on file  Transportation Needs: Not on file  Physical Activity: Not on file  Stress: Not on file  Social Connections: Not on file  Intimate Partner Violence: Not on file    Current Outpatient Medications:  .  anastrozole (ARIMIDEX) 1 MG tablet, TAKE 1 TABLET BY MOUTH EVERY DAY, Disp: 90 tablet, Rfl: 2 .  benzonatate (TESSALON) 100 MG capsule, Take 2 capsules (200 mg total) by mouth 2 (two) times daily as needed for up to 10 days., Disp: 30 capsule, Rfl: 0 .  Calcium-Vitamin D-Vitamin K 500-100-40 MG-UNT-MCG CHEW, Chew 2 each by mouth daily., Disp: , Rfl:  .  cetirizine (ZYRTEC) 5 MG tablet, Take 5 mg by mouth daily as needed for allergies., Disp: , Rfl:  .  citalopram (CELEXA) 20 MG tablet, TAKE 1 TABLET BY MOUTH EVERY DAY, Disp: 90 tablet, Rfl: 1 .  diazepam (VALIUM) 5 MG tablet, Take 0.5-1 tablets (2.5-5 mg total) by mouth every 12 (twelve) hours as needed for anxiety., Disp: 30 tablet, Rfl: 1 .  guaiFENesin-codeine 100-10 MG/5ML syrup, Take 5 mLs by mouth 2 (two) times daily as needed for up to 10 days for cough., Disp: 100 mL, Rfl: 0 .  TURMERIC PO, Take by mouth., Disp: , Rfl:  .  valACYclovir (VALTREX) 1000 MG tablet, TAKE 1 TABLET ONCE DAILY FOR 5 DAYS AS NEEDED, START ASAP WITHIN 48 HOURS OF ONSET OF SYMPTOMS, Disp: 90 tablet, Rfl: 1  EXAM:  VITALS per patient if  applicable: Temp 245.8 F (37.8 C) (Oral)   Ht 5\' 6"  (1.676 m)   BMI 23.93 kg/m   GENERAL: alert, oriented, appears well and in no acute distress  HEENT: atraumatic, conjunctiva clear, no obvious abnormalities on inspection of external nose and ears  NECK: normal movements of the head and neck  LUNGS: on inspection no signs of respiratory distress, breathing rate appears normal, no obvious gross SOB, gasping or wheezing. A few episodes of non productive cough.  CV: no obvious cyanosis  MS: moves all visible extremities  without noticeable abnormality  PSYCH/NEURO: pleasant and cooperative, no obvious depression or anxiety, speech and thought processing grossly intact  ASSESSMENT AND PLAN:  Discussed the following assessment and plan:  COVID-19 virus infection We discussed signs and symptoms as well as possible complications. Home test positive + fever and respiratory symptoms, so presumed COVID 19 infection. COVID 19 test We reviewed treatment options, parenteral and oral.  For now she is not interested in antiviral treatment, afraid of side effects.  I do not think she is at high risk for complications, so it is appropriate to continue symptomatic treatment. Recommend continue plenty of p.o. fluids, Tylenol 500 mg 3-4 times per day, rest. Quarantine for 7 to 10 days since symptoms onset. Clearly instructed about warning signs.  Cough - Plan: guaiFENesin-codeine 100-10 MG/5ML syrup, benzonatate (TESSALON) 100 MG capsule Explained that cough can last a few weeks after all the other symptoms have resolved. Recommend symptomatic treatment with benzonatate and codeine, the latter when she has tried in the past and has been well-tolerated. I do not think imaging is needed at this time. Instructed on warning signs.  Hyperlipidemia, unspecified hyperlipidemia type We discussed current recommendations in regard to pharmacologic treatment. LDL 190s, so I strongly recommend considering  statin med. Once she recovers from acute illness, she will let me know which medication she would like to try, if any. We can follow 3 to 4 months after starting pharmacologic treatment.  We discussed possible serious and likely etiologies, options for evaluation and workup, limitations of telemedicine visit vs in person visit, treatment, treatment risks and precautions.  I discussed the assessment and treatment plan with the patient. Ms. Freundlich was provided an opportunity to ask questions and all were answered. She agreed with the plan and demonstrated an understanding of the instructions.   No follow-ups on file.    Josip Merolla [name], MD

## 2021-04-22 ENCOUNTER — Other Ambulatory Visit: Payer: Self-pay

## 2021-04-22 ENCOUNTER — Ambulatory Visit: Payer: BC Managed Care – PPO | Attending: General Surgery

## 2021-04-22 DIAGNOSIS — Z483 Aftercare following surgery for neoplasm: Secondary | ICD-10-CM

## 2021-04-22 NOTE — Therapy (Signed)
[place], [place], [address] Phone: [phone]   Fax:  [phone]  Physical Therapy Treatment  Patient Details  Name: Jean Morgan MRN: [id number] Date of Birth: 10/13/1961 No data recorded  Encounter Date: 04/22/2021   PT End of Session - 04/22/21 1554     Visit Number 25   # unchanged due to screen only   PT Start Time 8032    PT Stop Time 1549    PT Time Calculation (min) 16 min    Activity Tolerance Patient tolerated treatment well    Behavior During Therapy [facility] for tasks assessed/performed             Past Medical History:  Diagnosis Date   Anxiety    Cancer ([place])    skin   Depression    Family history of adverse reaction to anesthesia    brother had severe agitation coming out of anesthesia, had to be re-intubated   Family history of breast cancer    Family history of colon cancer    Family history of leukemia    Family history of ovarian cancer    Family history of prostate cancer    Family history of skin cancer    Heart murmur    "slight"   Hypothyroidism    Optic neuritis    Personal history of radiation therapy    [place] spotted fever    1970's    Past Surgical History:  Procedure Laterality Date   BREAST BIOPSY Right 02/01/2020   BREAST LUMPECTOMY Right 04/19/2020   BREAST LUMPECTOMY WITH RADIOACTIVE SEED AND SENTINEL LYMPH NODE BIOPSY Right 04/19/2020   Procedure: RIGHT BREAST LUMPECTOMY WITH RADIOACTIVE SEED AND SENTINEL LYMPH NODE BIOPSY;  Surgeon: [name], MD;  Location: [facility];  Service: General;  Laterality: Right;  [place]  2006   Right forearm   SENTINEL NODE BIOPSY Right 2006   TONSILLECTOMY     TONSILLECTOMY      There were no vitals filed for this visit.   Subjective Assessment - 04/22/21 1540     Subjective Pt returns for 3 month L-Dex screens.    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 Radiation completed   has started anastrozole                    L-DEX FLOWSHEETS - 04/22/21 1500       L-DEX LYMPHEDEMA SCREENING   Measurement Type Unilateral    L-DEX MEASUREMENT EXTREMITY Upper Extremity    POSITION  Standing    DOMINANT SIDE Right    At Risk Side Right    BASELINE SCORE (UNILATERAL) -1.8    L-DEX SCORE (UNILATERAL) -0.4    VALUE CHANGE (UNILAT) 1.4                                 PT Short Term Goals - 10/18/20 1207       PT SHORT TERM GOAL #1   Title Pt will be ind with vestibular exercises for habituation    Time 1    Period Days    Status Achieved  PT Long Term Goals - 07/10/20 1510       PT LONG TERM GOAL #1   Title Pt will be independent in a home exercise program for continued strengthening and stretching.    Time 4    Period Weeks    Status On-going      PT LONG TERM GOAL #2   Title Pt will report a 75% improvement in R neck and upper shoulder pain to allow improved comfort    Baseline 07/10/20- 75% improvement    Time 4    Period Weeks    Status Achieved      PT LONG TERM GOAL #3   Title Pt will report a 75% improvement in R inferior breast pain and swelling to allow improved comfort and to allow pt to receive radiation    Baseline 07/10/20- 25% improvement    Time 4    Period Weeks    Status On-going      PT LONG TERM GOAL #4   Title Pt will demonstrate 170 degrees of right shoulder flexion and abduction to allow her to return to prior level of function    Baseline flexion 150, abduction 158; 07/10/20- flexion 170, abduction 179    Time 4    Period Weeks    Status Achieved      PT LONG TERM GOAL #5   Title There will be a 50% improvement in scar tissue to allow improved comfort.    Time 4     Period Weeks    Status New    Target Date 08/07/20                   Plan - 04/22/21 1557     Clinical Impression Statement Pt returns for her 3 month L-Dex screen. Her change from baseline of 1.4 is WNLs so no further treatment is required at this time except to cont every 3 month L-Dex screens which pt is agreeable to.    PT Next Visit Plan Cont every 3 month L-Dex screens for up to 2 years from her SLNB.    Consulted and Agree with Plan of Care Patient             Patient will benefit from skilled therapeutic intervention in order to improve the following deficits and impairments:     Visit Diagnosis: Aftercare following surgery for neoplasm     Problem List Patient Active Problem List   Diagnosis Date Noted   PAD (peripheral artery disease) ([place]) 07/09/2020   Carpal tunnel syndrome 03/09/2020   Genetic testing 02/29/2020   Family history of breast cancer    Family history of ovarian cancer    Family history of colon cancer    Family history of prostate cancer    Family history of skin cancer    Family history of leukemia    Malignant neoplasm of lower-outer quadrant of right breast of female, estrogen receptor positive ([place]) 02/02/2020   Osteopenia 01/25/2020   B12 deficiency 01/04/2020   Hearing loss 08/18/2018   Hyperlipidemia 11/28/2016   Anxiety disorder 11/28/2016   Chronic fatigue 11/28/2016   Recurrent genital herpes 11/28/2016   Melanoma of skin, site unspecified 06/26/2013    Otelia Limes, PTA 04/22/2021, 5:22 PM  [place], [place], [address] Phone: [phone]   Fax:  [phone]  Name: Jean Morgan MRN: [id number] Date of Birth: August 18, 1961

## 2021-04-24 ENCOUNTER — Other Ambulatory Visit: Payer: Self-pay | Admitting: Family Medicine

## 2021-04-24 DIAGNOSIS — F419 Anxiety disorder, unspecified: Secondary | ICD-10-CM

## 2021-05-02 ENCOUNTER — Telehealth: Payer: Self-pay

## 2021-05-02 NOTE — Telephone Encounter (Signed)
This nurse spoke with patient who wanted to know if she needed to change the way that she is taking her Anastrozole since she will be traveling to [place] for 3 weeks and there is a 6 hour time difference.  This nurse advised patient there was no specific changes that needed to be made.  As long as she continues to take her medication she should be fine.  No further questions or concerns at this time.

## 2021-05-06 ENCOUNTER — Encounter (HOSPITAL_COMMUNITY): Payer: Self-pay

## 2021-05-14 ENCOUNTER — Ambulatory Visit: Payer: BC Managed Care – PPO | Admitting: Radiation Oncology

## 2021-05-22 ENCOUNTER — Telehealth: Payer: Self-pay | Admitting: Hematology

## 2021-05-22 NOTE — Telephone Encounter (Signed)
Left message with rescheduled upcoming appointment due to provider's breast clinic. 

## 2021-06-23 NOTE — Progress Notes (Signed)
[place]   [phone] [phone]   Clinic Follow up Note   Patient Care Team: [name], [name], MD as PCP - General (Family Medicine) Rolm Bookbinder, MD as Consulting Physician (Dermatology) [name], RN as Oncology Nurse Navigator [name], RN as Oncology Nurse Navigator [name], MD as Consulting Physician (General Surgery) Truitt Merle, MD as Consulting Physician (Hematology) [name], MD as Consulting Physician (Radiation Oncology) Alla Feeling, NP as Nurse Practitioner (Nurse Practitioner)  Date of Service:  06/24/2021  CHIEF COMPLAINT: f/u of right breast cancer  ASSESSMENT & PLAN:  Jean Morgan is a 60 y.o. female with   1. 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  -She underwent right breast lumpectomy with Dr Marlou Starks on 04/19/20 and adjuvant Radiation with Dr Lisbeth Renshaw.  -She started anastrozole before surgery in 02/2020. She has multiple mild side effects, including low libido, skin and eye dryness, hair thinning, hot flashes and joint stiffness, most of them are tolerable. The joint pain is the most bothersome, and we discussed switching to exemestane. She would like to try and see if it improves her pain. -her most recent mammogram on 02/06/21 showed an abnormality in the right breast. Biopsy on 02/08/21 was benign. -She is clinically doing well. Lab reviewed, her CBC and CMP are within normal limits. Her physical exam was unremarkable. There is no clinical concern for recurrence. -Continue surveillance. Next mammogram in 01/2022. Will obtain DEXA at the same time. -stop anastrozole and switch to exemestane. -F/u in 6 months. She will f/u with [name] in the interim.  2. Antiestrogen side effects Management -She has multiple mild side effects from anastrozole: low libido, skin and eye dryness, hair thinning,  hot flashes and joint stiffness.  -we are switching her to exemestane. Will continue to monitor.  3. Osteopenia -She was recently diagnosed by 12/2019 DEXA. Will repeat in 2023.  -Will monitor on AI which can weaken her bone.  -continue Calcium with Vit D.  4. Covid-19+ 02/19/21 -experienced cough, nasal congestion, post nasal drip, dry throat. -vaccinated x3, most recently 01/04/21 -she has recovered well, taste has returned.   5. 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 She was recently found to have elevated B12 and told to stop her supplement. She notes increased fatigue without it. I recommend she take every other day. 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 S/p removal.  -Continue Celexa, mood stable, did not get worse after she started anastrozole.   6. Genetic Testing negative for pathogenetic mutations     PLAN:  -stop anastrozole, start exemestane, I called in today  -Lab and F/u in 6 months    No problem-specific Assessment & Plan notes found for this encounter.   SUMMARY OF ONCOLOGIC HISTORY: Oncology History Overview Note  Cancer Staging Malignant neoplasm of lower-outer quadrant of right breast of female, estrogen receptor positive ([place]) Staging form: Breast, AJCC 8th Edition - Clinical stage from 01/31/2020: Stage IB (cT2, cN0, cM0, G2, ER+, PR+, HER2-) - Signed by Truitt Merle, MD on 02/07/2020    Malignant neoplasm of lower-outer quadrant of right breast of female, estrogen receptor positive ([place])  01/23/2020 Imaging   DEXA 01/23/20  Osteopenia with lowest T-score -1.6 at left femur neck  Site Region Measured Date Measured Age  YA BMD Significant CHANGE T-score DualFemur Neck Left  01/23/2020    60.1         -1.6    0.815 g/cm2   AP Spine  L1-L4 (L3) 01/23/2020    60.1         -1.1    1.035 g/cm2   DualFemur Total Mean 01/23/2020    60.1         -1.2     0.851 g/cm2   01/23/2020 Mammogram   Diangostic Mammogram 01/23/20  IMPRESSION: 1. There is a highly suspicious mass in the right breast at 6 o'clock 3 cm from the nipple demonstrates an irregular hypoechoic shadowing mass measuring 2.3 x 1.4 x 1.7 cm associated with pleomorphic calcifications.   2.  No evidence of right axillary lymphadenopathy.   3. There is a benign-appearing cyst in the superior left breast at 12:30, 3 cm from the nipple demonstrates an anechoic oval circumscribed mass measuring 0.9 x 0.8 x 0.9 cm. No evidence of left breast malignancy.    01/31/2020 Cancer Staging   Staging form: Breast, AJCC 8th Edition - Clinical stage from 01/31/2020: Stage IB (cT2, cN0, cM0, G2, ER+, PR+, HER2-) - Signed by Truitt Merle, MD on 02/07/2020   01/31/2020 Initial Biopsy   Diagnosis 01/31/20 Breast, right, needle core biopsy, 6 o'clock - INVASIVE MAMMARY CARCINOMA AND MAMMARY CARCINOMA IN SITU WITH PERINEURAL INVASION AND CALCIFICATIONS. Microscopic Comment The carcinoma is nuclear grade 2. The greatest linear extent of tumor in any one core is 16 mm. Immunohistochemistry of E-cadherin will be reported separately. Ancillary studies will be reported separately.   01/31/2020 Receptors her2   PROGNOSTIC INDICATORS Results: IMMUNOHISTOCHEMICAL AND MORPHOMETRIC ANALYSIS PERFORMED MANUALLY The tumor cells are NEGATIVE for Her2 (1+). Estrogen Receptor: 100%, POSITIVE, STRONG STAINING INTENSITY Progesterone Receptor: 80%, POSITIVE, STRONG STAINING INTENSITY Proliferation Marker Ki67: 15%   01/31/2020 Oncotype testing   Low risk with recurrence score 17.  Distant risk of recurrence at 9 years 5% with AI or Tamoxifen. There is less than 1% benefit of chemotherapy.    02/02/2020 Initial Diagnosis   Malignant neoplasm of lower-outer quadrant of right breast of female, estrogen receptor positive ([place])   02/17/2020 Imaging   Breast MRI  IMPRESSION: Known malignancy in the LOWER central portion of the  RIGHT breast measuring 3.1 centimeters.   No adenopathy in the RIGHT axilla.   No additional areas of concern are identified in either breast.     02/27/2020 -  Neo-Adjuvant Anti-estrogen oral therapy   Anastrozole 54m once daily starting in 02/27/20 with half tablet to see if tolerable.    02/28/2020 Genetic Testing   Negative genetic testing:  No pathogenic variants detected on the [facility] Multi-Cancer Panel. Two variants of uncertain significance were detected - one in the HOXB13 gene called c.215G>T and one in the RNF43 gene called c.2139A>T. The report date is 02/28/2020.  The Multi-Cancer Panel offered by [facility] includes sequencing and/or deletion duplication testing of the following 85 genes: AIP, ALK, APC, ATM, AXIN2,BAP1,  BARD1, BLM, BMPR1A, BRCA1, BRCA2, BRIP1, CASR, CDC73, CDH1, CDK4, CDKN1B, CDKN1C, CDKN2A (p14ARF), CDKN2A (p16INK4a), CEBPA, CHEK2, CTNNA1, DICER1, DIS3L2, EGFR (c.2369C>T, p.Thr790Met variant only), EPCAM (Deletion/duplication testing only), FH, FLCN, GATA2, GPC3, GREM1 (Promoter region deletion/duplication testing only), HOXB13 (c.251G>A, p.Gly84Glu), HRAS, KIT, MAX, MEN1, MET, MITF (c.952G>A, p.Glu318Lys variant only), MLH1, MSH2, MSH3, MSH6, MUTYH, NBN, NF1, NF2, NTHL1, PALB2, PDGFRA, PHOX2B, PMS2, POLD1, POLE, POT1, PRKAR1A, PTCH1, PTEN, RAD50, RAD51C, RAD51D, RB1, RECQL4, RET, RNF43, RUNX1, SDHAF2, SDHA (  sequence changes only), SDHB, SDHC, SDHD, SMAD4, SMARCA4, SMARCB1, SMARCE1, STK11, SUFU, TERC, TERT, TMEM127, TP53, TSC1, TSC2, VHL, WRN and WT1.    04/19/2020 Surgery   RIGHT BREAST LUMPECTOMY WITH RADIOACTIVE SEED AND SENTINEL LYMPH NODE BIOPSY by Dr Marlou Starks    04/19/2020 Pathology Results   FINAL MICROSCOPIC DIAGNOSIS:   A. BREAST, RIGHT, LUMPECTOMY:  -  Invasive ductal carcinoma, Nottingham grade 2 of 3, 2.2 cm  -  Ductal carcinoma in-situ  -  Calcifications associated with carcinoma  -  Margins uninvolved by carcinoma (< 0.1 cm; posterior margin)  -  Previous  biopsy site changes present  -  See oncology table and comment below   B. LYMPH NODE, RIGHT AXILLARY #1, SENTINEL, BIOPSY:  -  No carcinoma identified in one lymph node (0/1)  -  See comment   C. LYMPH NODE, RIGHT AXILLARY, SENTINEL, BIOPSY:  -  No carcinoma identified in one lymph node (0/1)  -  See comment   D. LYMPH NODE, RIGHT AXILLARY #2, SENTINEL, BIOPSY:  -  No carcinoma identified in one lymph node (0/1)  -  See comment   COMMENT:   A.  The previous biopsy and E-cadherin immunohistochemistry  ([id number]) was reviewed in conjunction with this case and supports  the above interpretation.   B-D. Given the lobular-like morphology, cytokeratin AE1/3 was performed  on the sentinel lymph nodes to exclude micrometastasis.  There is no  evidence of metastatic carcinoma by immunohistochemistry.    05/28/2020 - 06/22/2020 Radiation Therapy   Adjuvant Radiation with Dr Lisbeth Renshaw    09/26/2020 Survivorship   SCP delivered by Cira Rue, NP       CURRENT THERAPY:  Anastrozole 32m once daily starting in 02/27/20.  INTERVAL HISTORY:  VShaquoia Miersis here for a follow up of breast cancer. She was last seen by me on 12/19/20. She presents to the clinic alone. She reports she recently [place] the trip. She reports she had Covid since her last visit. She denies taking any Covid-specific medications or going to the hospital. She notes she had diminished taste, and this has since returned. She reports continued issues with her joints, mostly with bending.   All other systems were reviewed with the patient and are negative.  MEDICAL HISTORY:  Past Medical History:  Diagnosis Date   Anxiety    [place]    skin   Depression    Family history of adverse reaction to anesthesia    brother had severe agitation coming out of anesthesia, had to be re-intubated   Family history of breast cancer    Family history of colon cancer    Family  history of leukemia    Family history of ovarian cancer    Family history of prostate cancer    Family history of skin cancer    Heart murmur    "slight"   Hypothyroidism    Optic neuritis    Personal history of radiation therapy    [place] spotted fever    1970's    SURGICAL HISTORY: Past Surgical History:  Procedure Laterality Date   BREAST BIOPSY Right 02/01/2020   BREAST LUMPECTOMY Right 04/19/2020   BREAST LUMPECTOMY WITH RADIOACTIVE SEED AND SENTINEL LYMPH NODE BIOPSY Right 04/19/2020   Procedure: RIGHT BREAST LUMPECTOMY WITH RADIOACTIVE SEED AND SENTINEL LYMPH NODE BIOPSY;  Surgeon: TJovita Kussmaul MD;  [facility]  Service: General;  Laterality: Right;  PEC BLOCK  DENTAL SURGERY     MELANOMA EXCISION  2006   Right forearm   SENTINEL NODE BIOPSY Right 2006   TONSILLECTOMY     TONSILLECTOMY      I have reviewed the social history and family history with the patient and they are unchanged from previous note.  ALLERGIES:  is allergic to latex and oxycodone.  MEDICATIONS:  Current Outpatient Medications  Medication Sig Dispense Refill   exemestane (AROMASIN) 25 MG tablet Take 1 tablet (25 mg total) by mouth daily after breakfast. 30 tablet 3   Calcium-Vitamin D-Vitamin K 500-100-40 MG-UNT-MCG CHEW Chew 2 each by mouth daily.     cetirizine (ZYRTEC) 5 MG tablet Take 5 mg by mouth daily as needed for allergies.     citalopram (CELEXA) 20 MG tablet TAKE 1 TABLET BY MOUTH EVERY DAY 90 tablet 2   diazepam (VALIUM) 5 MG tablet Take 0.5-1 tablets (2.5-5 mg total) by mouth every 12 (twelve) hours as needed for anxiety. 30 tablet 1   TURMERIC PO Take by mouth.     valACYclovir (VALTREX) 1000 MG tablet TAKE 1 TABLET ONCE DAILY FOR 5 DAYS AS NEEDED, START ASAP WITHIN 48 HOURS OF ONSET OF SYMPTOMS 90 tablet 1   No current facility-administered medications for this visit.    PHYSICAL EXAMINATION: ECOG PERFORMANCE STATUS: 0 - Asymptomatic  Vitals:   06/24/21 0842   BP: 129/71  Pulse: 70  Resp: 16  Temp: 98.8 F (37.1 C)  SpO2: 99%   Wt Readings from Last 3 Encounters:  06/24/21 152 lb 3.2 oz (69 kg)  01/25/21 148 lb 4 oz (67.2 kg)  12/19/20 146 lb 12.8 oz (66.6 kg)     GENERAL:alert, no distress and comfortable SKIN: skin color, texture, turgor are normal, no rashes or significant lesions EYES: normal, Conjunctiva are pink and non-injected, sclera clear  NECK: supple, thyroid normal size, non-tender, without nodularity LYMPH:  no palpable lymphadenopathy in the cervical, axillary LUNGS: clear to auscultation and percussion with normal breathing effort HEART: regular rate & rhythm and no murmurs and no lower extremity edema ABDOMEN:abdomen soft, non-tender and normal bowel sounds Musculoskeletal:no cyanosis of digits and no clubbing  NEURO: alert & oriented x 3 with fluent speech, no focal motor/sensory deficits BREAST: right breast healed well; No palpable mass, nodules or adenopathy bilaterally. Breast exam benign.   LABORATORY DATA:  I have reviewed the data as listed CBC Latest Ref Rng & Units 06/24/2021 12/19/2020 04/13/2020  WBC 4.0 - 10.5 K/uL 7.2 7.6 8.8  Hemoglobin 12.0 - 15.0 g/dL 13.3 13.3 13.7  Hematocrit 36.0 - 46.0 % 38.5 39.5 42.3  Platelets 150 - 400 K/uL 262 266 325     CMP Latest Ref Rng & Units 06/24/2021 01/25/2021 12/19/2020  Glucose 70 - 99 mg/dL 106(H) 93 93  BUN 8 - 23 mg/dL '11 10 12  ' Creatinine 0.44 - 1.00 mg/dL 0.79 0.73 0.77  Sodium 135 - 145 mmol/L 141 140 140  Potassium 3.5 - 5.1 mmol/L 4.1 3.6 3.9  Chloride 98 - 111 mmol/L 108 103 107  CO2 22 - 32 mmol/L '23 28 23  ' Calcium 8.9 - 10.3 mg/dL 9.4 9.5 9.3  Total Protein 6.5 - 8.1 g/dL 7.1 - 7.5  Total Bilirubin 0.3 - 1.2 mg/dL 0.5 - 0.6  Alkaline Phos 38 - 126 U/L 78 - 81  AST 15 - 41 U/L 20 - 19  ALT 0 - 44 U/L 15 - 15      RADIOGRAPHIC STUDIES: I have  personally reviewed the radiological images as listed and agreed with the findings in the report. No  results found.    No orders of the defined types were placed in this encounter.  All questions were answered. The patient knows to call the clinic with any problems, questions or concerns. No barriers to learning was detected. The total time spent in the appointment was 30 minutes.     Truitt Merle, MD 06/24/2021   I, Wilburn Mylar, am acting as scribe for Truitt Merle, MD.   I have reviewed the above documentation for accuracy and completeness, and I agree with the above.

## 2021-06-24 ENCOUNTER — Encounter: Payer: Self-pay | Admitting: Hematology

## 2021-06-24 ENCOUNTER — Other Ambulatory Visit: Payer: Self-pay

## 2021-06-24 ENCOUNTER — Inpatient Hospital Stay: Payer: BC Managed Care – PPO | Attending: Hematology

## 2021-06-24 ENCOUNTER — Inpatient Hospital Stay (HOSPITAL_BASED_OUTPATIENT_CLINIC_OR_DEPARTMENT_OTHER): Payer: BC Managed Care – PPO | Admitting: Hematology

## 2021-06-24 VITALS — BP 129/71 | HR 70 | Temp 98.8°F | Resp 16 | Ht 66.0 in | Wt 152.2 lb

## 2021-06-24 DIAGNOSIS — Z8041 Family history of malignant neoplasm of ovary: Secondary | ICD-10-CM | POA: Diagnosis not present

## 2021-06-24 DIAGNOSIS — C50511 Malignant neoplasm of lower-outer quadrant of right female breast: Secondary | ICD-10-CM | POA: Insufficient documentation

## 2021-06-24 DIAGNOSIS — R6882 Decreased libido: Secondary | ICD-10-CM | POA: Diagnosis not present

## 2021-06-24 DIAGNOSIS — Z85820 Personal history of malignant melanoma of skin: Secondary | ICD-10-CM | POA: Diagnosis not present

## 2021-06-24 DIAGNOSIS — M85852 Other specified disorders of bone density and structure, left thigh: Secondary | ICD-10-CM | POA: Diagnosis not present

## 2021-06-24 DIAGNOSIS — E538 Deficiency of other specified B group vitamins: Secondary | ICD-10-CM | POA: Insufficient documentation

## 2021-06-24 DIAGNOSIS — F32A Depression, unspecified: Secondary | ICD-10-CM | POA: Diagnosis not present

## 2021-06-24 DIAGNOSIS — Z923 Personal history of irradiation: Secondary | ICD-10-CM | POA: Insufficient documentation

## 2021-06-24 DIAGNOSIS — F419 Anxiety disorder, unspecified: Secondary | ICD-10-CM | POA: Insufficient documentation

## 2021-06-24 DIAGNOSIS — Z885 Allergy status to narcotic agent status: Secondary | ICD-10-CM | POA: Diagnosis not present

## 2021-06-24 DIAGNOSIS — Z800 Family history of malignant neoplasm of digestive organs: Secondary | ICD-10-CM | POA: Insufficient documentation

## 2021-06-24 DIAGNOSIS — Z79811 Long term (current) use of aromatase inhibitors: Secondary | ICD-10-CM | POA: Insufficient documentation

## 2021-06-24 DIAGNOSIS — Z806 Family history of leukemia: Secondary | ICD-10-CM | POA: Insufficient documentation

## 2021-06-24 DIAGNOSIS — Z8042 Family history of malignant neoplasm of prostate: Secondary | ICD-10-CM | POA: Insufficient documentation

## 2021-06-24 DIAGNOSIS — M2560 Stiffness of unspecified joint, not elsewhere classified: Secondary | ICD-10-CM | POA: Diagnosis not present

## 2021-06-24 DIAGNOSIS — Z79899 Other long term (current) drug therapy: Secondary | ICD-10-CM | POA: Insufficient documentation

## 2021-06-24 DIAGNOSIS — Z170 Estrogen receptor positive status [ER+]: Secondary | ICD-10-CM | POA: Insufficient documentation

## 2021-06-24 DIAGNOSIS — Z808 Family history of malignant neoplasm of other organs or systems: Secondary | ICD-10-CM | POA: Diagnosis not present

## 2021-06-24 DIAGNOSIS — R232 Flushing: Secondary | ICD-10-CM | POA: Insufficient documentation

## 2021-06-24 DIAGNOSIS — Z803 Family history of malignant neoplasm of breast: Secondary | ICD-10-CM | POA: Insufficient documentation

## 2021-06-24 DIAGNOSIS — M2550 Pain in unspecified joint: Secondary | ICD-10-CM | POA: Insufficient documentation

## 2021-06-24 DIAGNOSIS — N6002 Solitary cyst of left breast: Secondary | ICD-10-CM | POA: Diagnosis not present

## 2021-06-24 LAB — CBC WITH DIFFERENTIAL (CANCER CENTER ONLY)
Abs Immature Granulocytes: 0.02 10*3/uL (ref 0.00–0.07)
Basophils Absolute: 0 10*3/uL (ref 0.0–0.1)
Basophils Relative: 0 %
Eosinophils Absolute: 0.2 10*3/uL (ref 0.0–0.5)
Eosinophils Relative: 3 %
HCT: 38.5 % (ref 36.0–46.0)
Hemoglobin: 13.3 g/dL (ref 12.0–15.0)
Immature Granulocytes: 0 %
Lymphocytes Relative: 31 %
Lymphs Abs: 2.2 10*3/uL (ref 0.7–4.0)
MCH: 31.6 pg (ref 26.0–34.0)
MCHC: 34.5 g/dL (ref 30.0–36.0)
MCV: 91.4 fL (ref 80.0–100.0)
Monocytes Absolute: 0.3 10*3/uL (ref 0.1–1.0)
Monocytes Relative: 5 %
Neutro Abs: 4.4 10*3/uL (ref 1.7–7.7)
Neutrophils Relative %: 61 %
Platelet Count: 262 10*3/uL (ref 150–400)
RBC: 4.21 MIL/uL (ref 3.87–5.11)
RDW: 12.7 % (ref 11.5–15.5)
WBC Count: 7.2 10*3/uL (ref 4.0–10.5)
nRBC: 0 % (ref 0.0–0.2)

## 2021-06-24 LAB — CMP (CANCER CENTER ONLY)
ALT: 15 U/L (ref 0–44)
AST: 20 U/L (ref 15–41)
Albumin: 4.1 g/dL (ref 3.5–5.0)
Alkaline Phosphatase: 78 U/L (ref 38–126)
Anion gap: 10 (ref 5–15)
BUN: 11 mg/dL (ref 8–23)
CO2: 23 mmol/L (ref 22–32)
Calcium: 9.4 mg/dL (ref 8.9–10.3)
Chloride: 108 mmol/L (ref 98–111)
Creatinine: 0.79 mg/dL (ref 0.44–1.00)
GFR, Estimated: >60 mL/min (ref >60)
Glucose, Bld: 106 mg/dL — ABNORMAL HIGH (ref 70–99)
Potassium: 4.1 mmol/L (ref 3.5–5.1)
Sodium: 141 mmol/L (ref 135–145)
Total Bilirubin: 0.5 mg/dL (ref 0.3–1.2)
Total Protein: 7.1 g/dL (ref 6.5–8.1)

## 2021-06-24 MED ORDER — EXEMESTANE 25 MG PO TABS: 25.0000 mg | ORAL_TABLET | Freq: Every day | ORAL | 3 refills | Status: DC

## 2021-06-26 ENCOUNTER — Other Ambulatory Visit: Payer: BC Managed Care – PPO

## 2021-06-26 ENCOUNTER — Ambulatory Visit: Payer: BC Managed Care – PPO | Admitting: Hematology

## 2021-07-22 ENCOUNTER — Other Ambulatory Visit: Payer: Self-pay

## 2021-07-22 ENCOUNTER — Ambulatory Visit: Payer: BC Managed Care – PPO | Attending: General Surgery

## 2021-07-22 VITALS — Wt 148.5 lb

## 2021-07-22 DIAGNOSIS — Z483 Aftercare following surgery for neoplasm: Secondary | ICD-10-CM | POA: Insufficient documentation

## 2021-07-22 NOTE — Therapy (Signed)
[place], [place], [address] Phone: [phone]   Fax:  [phone]  Physical Therapy Treatment  Patient Details  Name: Jean Morgan MRN: [id number] Date of Birth: 03-13-1961 No data recorded  Encounter Date: 07/22/2021   PT End of Session - 07/22/21 1541     Visit Number 25   # unchanged due to screen only   PT Start Time 1532    PT Stop Time 1541    PT Time Calculation (min) 9 min    Activity Tolerance Patient tolerated treatment well    Behavior During Therapy [facility] for tasks assessed/performed             Past Medical History:  Diagnosis Date   Anxiety    Cancer ([place])    skin   Depression    Family history of adverse reaction to anesthesia    brother had severe agitation coming out of anesthesia, had to be re-intubated   Family history of breast cancer    Family history of colon cancer    Family history of leukemia    Family history of ovarian cancer    Family history of prostate cancer    Family history of skin cancer    Heart murmur    "slight"   Hypothyroidism    Optic neuritis    Personal history of radiation therapy    [place] spotted fever    1970's    Past Surgical History:  Procedure Laterality Date   BREAST BIOPSY Right 02/01/2020   BREAST LUMPECTOMY Right 04/19/2020   BREAST LUMPECTOMY WITH RADIOACTIVE SEED AND SENTINEL LYMPH NODE BIOPSY Right 04/19/2020   Procedure: RIGHT BREAST LUMPECTOMY WITH RADIOACTIVE SEED AND SENTINEL LYMPH NODE BIOPSY;  Surgeon: [name], MD;  Location: [facility];  Service: General;  Laterality: Right;  [place]  2006   Right forearm   SENTINEL NODE BIOPSY Right 2006   TONSILLECTOMY     TONSILLECTOMY      Vitals:   07/22/21 1535  Weight: 148 lb 8 oz (67.4 kg)     Subjective Assessment - 07/22/21 1535     Subjective Pt returns for 3 month L-Dex screens.    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 Radiation completed   has started anastrozole                    L-DEX FLOWSHEETS - 07/22/21 1500       L-DEX LYMPHEDEMA SCREENING   Measurement Type Unilateral    L-DEX MEASUREMENT EXTREMITY Upper Extremity    POSITION  Standing    DOMINANT SIDE Right    At Risk Side Right    BASELINE SCORE (UNILATERAL) -1.8    L-DEX SCORE (UNILATERAL) -0.8    VALUE CHANGE (UNILAT) 1                                  PT Short Term Goals - 10/18/20 1207       PT SHORT TERM GOAL #1   Title Pt will be ind with vestibular exercises for habituation    Time 1    Period Days    Status  Achieved               PT Long Term Goals - 07/10/20 1510       PT LONG TERM GOAL #1   Title Pt will be independent in a home exercise program for continued strengthening and stretching.    Time 4    Period Weeks    Status On-going      PT LONG TERM GOAL #2   Title Pt will report a 75% improvement in R neck and upper shoulder pain to allow improved comfort    Baseline 07/10/20- 75% improvement    Time 4    Period Weeks    Status Achieved      PT LONG TERM GOAL #3   Title Pt will report a 75% improvement in R inferior breast pain and swelling to allow improved comfort and to allow pt to receive radiation    Baseline 07/10/20- 25% improvement    Time 4    Period Weeks    Status On-going      PT LONG TERM GOAL #4   Title Pt will demonstrate 170 degrees of right shoulder flexion and abduction to allow her to return to prior level of function    Baseline flexion 150, abduction 158; 07/10/20- flexion 170, abduction 179    Time 4    Period Weeks    Status Achieved      PT LONG TERM GOAL #5   Title There will be a 50% improvement in scar tissue to allow improved  comfort.    Time 4    Period Weeks    Status New    Target Date 08/07/20                   Plan - 07/22/21 1542     Clinical Impression Statement Pt returns for her 3 month L-Dex screen. Her change from baseline of 1 is WNLs so no further treatment is required at thist time except to cont every 3 month L-Dex screens which pt is agreeable to.    PT Next Visit Plan Cont every 3 month L-Dex screens for up to 2 years from her SLNB (~03/30/2022)    Consulted and Agree with Plan of Care Patient             Patient will benefit from skilled therapeutic intervention in order to improve the following deficits and impairments:     Visit Diagnosis: Aftercare following surgery for neoplasm     Problem List Patient Active Problem List   Diagnosis Date Noted   PAD (peripheral artery disease) ([place]) 07/09/2020   Carpal tunnel syndrome 03/09/2020   Genetic testing 02/29/2020   Family history of breast cancer    Family history of ovarian cancer    Family history of colon cancer    Family history of prostate cancer    Family history of skin cancer    Family history of leukemia    Malignant neoplasm of lower-outer quadrant of right breast of female, estrogen receptor positive ([place]) 02/02/2020   Osteopenia 01/25/2020   B12 deficiency 01/04/2020   Hearing loss 08/18/2018   Hyperlipidemia 11/28/2016   Anxiety disorder 11/28/2016   Chronic fatigue 11/28/2016   Recurrent genital herpes 11/28/2016   Melanoma of skin, site unspecified 06/26/2013    Otelia Limes, PTA 07/22/2021, 3:43 PM  [place], [place], [address] Phone: [phone]   Fax:  [phone]  Name: Myley Bahner  MRN: [id number] Date of Birth: 04-22-1961

## 2021-09-12 ENCOUNTER — Other Ambulatory Visit: Payer: Self-pay | Admitting: Hematology

## 2021-11-04 ENCOUNTER — Other Ambulatory Visit: Payer: Self-pay

## 2021-11-04 ENCOUNTER — Ambulatory Visit: Payer: BC Managed Care – PPO | Attending: General Surgery

## 2021-11-04 VITALS — Wt 157.4 lb

## 2021-11-04 DIAGNOSIS — Z483 Aftercare following surgery for neoplasm: Secondary | ICD-10-CM | POA: Insufficient documentation

## 2021-11-04 NOTE — Therapy (Signed)
[facility] @ [facility] [address] [place], [place], [address] Phone: [phone]   Fax:  [phone]  Physical Therapy Treatment  Patient Details  Name: Jean Morgan MRN: [id number] Date of Birth: [birth year] No data recorded  Encounter Date: 11/04/2021   PT End of Session - 11/04/21 1623     Visit Number 25   # unchanged due to screen only   PT Start Time 1619    PT Stop Time 1623    PT Time Calculation (min) 4 min    Activity Tolerance Patient tolerated treatment well    Behavior During Therapy WFL for tasks assessed/performed             Past Medical History:  Diagnosis Date   Anxiety    Cancer ([facility])    skin   Depression    Family history of adverse reaction to anesthesia    brother had severe agitation coming out of anesthesia, had to be re-intubated   Family history of breast cancer    Family history of colon cancer    Family history of leukemia    Family history of ovarian cancer    Family history of prostate cancer    Family history of skin cancer    Heart murmur    "slight"   Hypothyroidism    Optic neuritis    Personal history of radiation therapy    [place] spotted fever    1970's    Past Surgical History:  Procedure Laterality Date   BREAST BIOPSY Right 02/01/2020   BREAST LUMPECTOMY Right 04/19/2020   BREAST LUMPECTOMY WITH RADIOACTIVE SEED AND SENTINEL LYMPH NODE BIOPSY Right 04/19/2020   Procedure: RIGHT BREAST LUMPECTOMY WITH RADIOACTIVE SEED AND SENTINEL LYMPH NODE BIOPSY;  Surgeon: [name], MD;  Location: [facility];  Service: General;  Laterality: Right;  [place]  2006   Right forearm   SENTINEL NODE BIOPSY Right 2006   TONSILLECTOMY     TONSILLECTOMY      Vitals:   11/04/21 1620  Weight: 157 lb 6 oz (71.4 kg)     Subjective Assessment - 11/04/21 1620     Subjective Pt returns for 3 month L-Dex screens.    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 Radiation completed   has started anastrozole                    L-DEX FLOWSHEETS - 11/04/21 1600       L-DEX LYMPHEDEMA SCREENING   Measurement Type Unilateral    L-DEX MEASUREMENT EXTREMITY Upper Extremity    POSITION  Standing    DOMINANT SIDE Right    At Risk Side Right    BASELINE SCORE (UNILATERAL) -1.8    L-DEX SCORE (UNILATERAL) 1.5    VALUE CHANGE (UNILAT) 3.3                                  PT Short Term Goals - 10/18/20 1207       PT SHORT TERM GOAL #1   Title Pt will be ind with vestibular exercises for habituation    Time 1    Period Days  Status Achieved               PT Long Term Goals - 07/10/20 1510       PT LONG TERM GOAL #1   Title Pt will be independent in a home exercise program for continued strengthening and stretching.    Time 4    Period Weeks    Status On-going      PT LONG TERM GOAL #2   Title Pt will report a 75% improvement in R neck and upper shoulder pain to allow improved comfort    Baseline 07/10/20- 75% improvement    Time 4    Period Weeks    Status Achieved      PT LONG TERM GOAL #3   Title Pt will report a 75% improvement in R inferior breast pain and swelling to allow improved comfort and to allow pt to receive radiation    Baseline 07/10/20- 25% improvement    Time 4    Period Weeks    Status On-going      PT LONG TERM GOAL #4   Title Pt will demonstrate 170 degrees of right shoulder flexion and abduction to allow her to return to prior level of function    Baseline flexion 150, abduction 158; 07/10/20- flexion 170, abduction 179    Time 4    Period Weeks    Status Achieved      PT LONG TERM GOAL #5   Title There will be a 50% improvement in scar tissue to allow improved  comfort.    Time 4    Period Weeks    Status New    Target Date 08/07/20                   Plan - 11/04/21 1623     Clinical Impression Statement Pt returns for her 3 month L-Dex screen. Her change from baseline of 3.3 is WNLs so no further treatment is required at this time except to cont every 3 month L-Dex screens which pt is agreeable to.    PT Next Visit Plan Cont every 3 month L-Dex screens for up to 2 years from her SLNB (~03/30/2022)    Consulted and Agree with Plan of Care Patient             Patient will benefit from skilled therapeutic intervention in order to improve the following deficits and impairments:     Visit Diagnosis: Aftercare following surgery for neoplasm     Problem List Patient Active Problem List   Diagnosis Date Noted   PAD (peripheral artery disease) ([facility]) 07/09/2020   Carpal tunnel syndrome 03/09/2020   Genetic testing 02/29/2020   Family history of breast cancer    Family history of ovarian cancer    Family history of colon cancer    Family history of prostate cancer    Family history of skin cancer    Family history of leukemia    Malignant neoplasm of lower-outer quadrant of right breast of female, estrogen receptor positive ([facility]) 02/02/2020   Osteopenia 01/25/2020   B12 deficiency 01/04/2020   Hearing loss 08/18/2018   Hyperlipidemia 11/28/2016   Anxiety disorder 11/28/2016   Chronic fatigue 11/28/2016   Recurrent genital herpes 11/28/2016   Melanoma of skin, site unspecified 06/26/2013    Otelia Limes, PTA 11/04/2021, 4:39 PM  [facility] @ [facility] [place], [place], [address] Phone: [phone]   Fax:  [phone]  Name: Leydi Winstead MRN: [id number] Date of Birth: [birth year]

## 2021-11-13 ENCOUNTER — Telehealth (INDEPENDENT_AMBULATORY_CARE_PROVIDER_SITE_OTHER): Payer: BC Managed Care – PPO | Admitting: Family Medicine

## 2021-11-13 ENCOUNTER — Telehealth: Payer: Self-pay | Admitting: Family Medicine

## 2021-11-13 ENCOUNTER — Encounter: Payer: Self-pay | Admitting: Family Medicine

## 2021-11-13 VITALS — Ht 66.0 in

## 2021-11-13 DIAGNOSIS — R059 Cough, unspecified: Secondary | ICD-10-CM | POA: Diagnosis not present

## 2021-11-13 DIAGNOSIS — J069 Acute upper respiratory infection, unspecified: Secondary | ICD-10-CM | POA: Diagnosis not present

## 2021-11-13 MED ORDER — BENZONATATE 100 MG PO CAPS: 200.0000 mg | ORAL_CAPSULE | Freq: Two times a day (BID) | ORAL | 0 refills | Status: AC | PRN

## 2021-11-13 MED ORDER — FLUTICASONE PROPIONATE 50 MCG/ACT NA SUSP: 1.0000 | Freq: Two times a day (BID) | NASAL | 0 refills | Status: DC

## 2021-11-13 NOTE — Progress Notes (Signed)
Virtual Visit via Video Note I connected with Jean Morgan on 11/13/21 by a video enabled telemedicine application and verified that I am speaking with the correct person using two identifiers.  Location patient: home Location provider:work office Persons participating in the virtual visit: patient, provider  I discussed the limitations of evaluation and management by telemedicine and the availability of in person appointments. The patient expressed understanding and agreed to proceed.  Chief Complaint  Patient presents with   Cough    X 4 days, stuffy head.    HPI: Jean Morgan is a 61 yo female with hx of right breast cancer on exemestane, hyperlipidemia, PAD, and anxiety complaining of 4 days of respiratory symptoms as described above. + Fatigue, nasal congestion, rhinorrhea, postnasal drainage, cough, and body aches.  Negative for fever, anosmia,ageusia,sore throat, earache,wheezing, dyspnea, abdominal pain, nausea, vomiting, changes in bowel habits, urinary symptoms, or skin rash.  This morning she coughed up some yellowish sputum, but in general cough is nonproductive. No hemoptysis. Some people at work has been sick with COVID-19 infection, last exposure about 2 weeks ago. 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  Home COVID 19 test negative 2 days ago. She has taking DayQuil and NyQuil. Cough interferes with sleep.  ROS: See pertinent positives and negatives per HPI.  Past Medical History:  Diagnosis Date   Anxiety    Cancer ([facility])    skin   Depression    Family history of adverse reaction to anesthesia    brother had severe agitation coming out of anesthesia, had to be re-intubated   Family history of breast cancer    Family history of colon cancer    Family history of leukemia    Family history of ovarian cancer    Family history of prostate cancer    Family history of skin cancer    Heart murmur    "slight"   Hypothyroidism     Optic neuritis    Personal history of radiation therapy    [place] spotted fever    1970's    Past Surgical History:  Procedure Laterality Date   BREAST BIOPSY Right 02/01/2020   BREAST LUMPECTOMY Right 04/19/2020   BREAST LUMPECTOMY WITH RADIOACTIVE SEED AND SENTINEL LYMPH NODE BIOPSY Right 04/19/2020   Procedure: RIGHT BREAST LUMPECTOMY WITH RADIOACTIVE SEED AND SENTINEL LYMPH NODE BIOPSY;  Surgeon: [name], MD;  Location: [facility];  Service: General;  Laterality: Right;  [place]  2006   Right forearm   SENTINEL NODE BIOPSY Right 2006   TONSILLECTOMY     TONSILLECTOMY      Family History  Problem Relation Age of Onset   Colon polyps Mother        more than 34   Colitis Mother    Arthritis Mother    Hyperlipidemia Mother    Diabetes Mother    Skin cancer Mother    Heart disease Father    Hyperlipidemia Father    Hypertension Father    Colon cancer Paternal Aunt        dx. in her 54s   Diabetes Maternal Grandmother    Breast cancer Maternal Grandmother        dx. >50   Breast cancer Other 13       maternal great-aunt   Ovarian cancer Other 61   Colon cancer Other 61   Skin  cancer Other 53   Prostate cancer Half-Brother 8   Leukemia Maternal Great-grandmother 25       chronic    Social History   Socioeconomic History   Marital status: Married    Spouse name: Not on file   Number of children: 1   Years of education: Not on file   Highest education level: Not on file  Occupational History   Not on file  Tobacco Use   Smoking status: Never   Smokeless tobacco: Never  Vaping Use   Vaping Use: Never used  Substance and Sexual Activity   Alcohol use: Yes    Alcohol/week: 7.0 standard drinks    Types: 7 Glasses of wine per week   Drug use: No   Sexual activity: Not on file  Other Topics Concern   Not on file  Social History Narrative   Not on file   Social Determinants of Health   Financial  Resource Strain: Not on file  Food Insecurity: Not on file  Transportation Needs: Not on file  Physical Activity: Not on file  Stress: Not on file  Social Connections: Not on file  Intimate Partner Violence: Not on file      Current Outpatient Medications:    benzonatate (TESSALON) 100 MG capsule, Take 2 capsules (200 mg total) by mouth 2 (two) times daily as needed for up to 10 days., Disp: 35 capsule, Rfl: 0   Calcium-Vitamin D-Vitamin K 448-185-63 MG-UNT-MCG CHEW, Chew 2 each by mouth daily., Disp: , Rfl:    cetirizine (ZYRTEC) 5 MG tablet, Take 5 mg by mouth daily as needed for allergies., Disp: , Rfl:    citalopram (CELEXA) 20 MG tablet, TAKE 1 TABLET BY MOUTH EVERY DAY, Disp: 90 tablet, Rfl: 2   diazepam (VALIUM) 5 MG tablet, Take 0.5-1 tablets (2.5-5 mg total) by mouth every 12 (twelve) hours as needed for anxiety., Disp: 30 tablet, Rfl: 1   exemestane (AROMASIN) 25 MG tablet, TAKE 1 TABLET BY MOUTH DAILY AFTER BREAKFAST., Disp: 90 tablet, Rfl: 1   fluticasone (FLONASE) 50 MCG/ACT nasal spray, Place 1 spray into both nostrils 2 (two) times daily., Disp: 16 g, Rfl: 0   TURMERIC PO, Take by mouth., Disp: , Rfl:    valACYclovir (VALTREX) 1000 MG tablet, TAKE 1 TABLET ONCE DAILY FOR 5 DAYS AS NEEDED, START ASAP WITHIN 48 HOURS OF ONSET OF SYMPTOMS, Disp: 90 tablet, Rfl: 1  EXAM:  VITALS per patient if applicable:Ht 5\' 6"  (1.676 m)    BMI 25.40 kg/m   GENERAL: alert, oriented, appears well and in no acute distress  HEENT: atraumatic, conjunctiva clear, no obvious abnormalities on inspection of external nose and ears. Nasal congestion.  NECK: normal movements of the head and neck  LUNGS: on inspection no signs of respiratory distress, breathing rate appears normal, no obvious gross SOB, gasping or wheezing  CV: no obvious cyanosis  MS: moves all visible extremities without noticeable abnormality  PSYCH/NEURO: pleasant and cooperative, no obvious depression or anxiety, speech  and thought processing grossly intact  ASSESSMENT AND PLAN:  Discussed the following assessment and plan:  URI, acute Symptoms suggests a viral etiology, so symptomatic treatment recommended.  I do not think abx is needed at this time. Adequate hydration. Flonase nasal spray to help with nasal congestion as well as nasal saline irrigation.  Instructed to monitor for signs of complications, including new onset of fever among some, clearly instructed about warning signs. 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 As far as she continues being fever free she can go back to work. F/U as needed.  Cough, unspecified type - Plan: benzonatate (TESSALON) 100 MG capsule I also explained that cough and nasal congestion can last a few days and sometimes weeks. Benzonatate for symptomatic treatment. Once cough becomes more productive she can try plain Mucinex. I do not think imaging is needed at this time.  We discussed possible serious and likely etiologies, options for evaluation and workup, limitations of telemedicine visit vs in person visit, treatment, treatment risks and precautions. The patient was advised to call back or seek an in-person evaluation if the symptoms worsen or if the condition fails to improve as anticipated. I discussed the assessment and treatment plan with the patient. The patient was provided an opportunity to ask questions and all were answered. The patient agreed with the plan and demonstrated an understanding of the instructions.  Return if symptoms worsen or fail to improve. [name]. [name], MD  [facility]. [place] office.

## 2021-11-13 NOTE — Telephone Encounter (Signed)
Patient calling in with respiratory symptoms: Shortness of breath, chest pain, palpitations or other red words send to Triage  Does the patient have a fever over 100, cough, congestion, sore throat, runny nose, lost of taste/smell (please list symptoms that patient has)? Cough,head stuffy  What date did symptoms start?11-09-2021 (If over 5 days ago, pt may be scheduled for in person visit)  Have you tested for Covid in the last 5 days? Yes   If yes, was it positive []  OR negative [x] ? If positive in the last 5 days, please schedule virtual visit now. If negative, schedule for an in person OV with the next available provider if PCP has no openings. Please also let patient know they will be tested again (follow the script below)  "you will have to arrive 19mins prior to your appt time to be Covid tested. Please park in back of office at the cone & call [phone] to let the staff know you have arrived. A staff member will meet you at your car to do a rapid covid test. Once the test has resulted you will be notified by phone of your results to determine if appt will remain an in person visit or be converted to a virtual/phone visit. If you arrive less than 41mins before your appt time, your visit will be automatically converted to virtual & any recommended testing will happen AFTER the visit."  Pt has virtual with dr [name] on 11-13-2021 THINGS TO REMEMBER  If no availability for virtual visit in office,  please schedule another [place] office  If no availability at another [facility] office, please instruct patient that they can schedule an evisit or virtual visit through their mychart account. Visits up to 8pm  patients can be seen in office 5 days after positive COVID test

## 2021-11-22 ENCOUNTER — Other Ambulatory Visit: Payer: Self-pay | Admitting: Hematology

## 2021-11-22 DIAGNOSIS — Z9889 Other specified postprocedural states: Secondary | ICD-10-CM

## 2021-12-07 ENCOUNTER — Other Ambulatory Visit: Payer: Self-pay | Admitting: Family Medicine

## 2021-12-25 ENCOUNTER — Encounter: Payer: Self-pay | Admitting: Hematology

## 2021-12-25 ENCOUNTER — Inpatient Hospital Stay: Payer: BC Managed Care – PPO

## 2021-12-25 ENCOUNTER — Other Ambulatory Visit: Payer: Self-pay

## 2021-12-25 ENCOUNTER — Inpatient Hospital Stay: Payer: BC Managed Care – PPO | Attending: Hematology | Admitting: Hematology

## 2021-12-25 VITALS — BP 135/77 | HR 71 | Temp 98.6°F | Resp 16 | Ht 66.0 in | Wt 156.6 lb

## 2021-12-25 DIAGNOSIS — Z85820 Personal history of malignant melanoma of skin: Secondary | ICD-10-CM | POA: Diagnosis not present

## 2021-12-25 DIAGNOSIS — M25569 Pain in unspecified knee: Secondary | ICD-10-CM | POA: Insufficient documentation

## 2021-12-25 DIAGNOSIS — Z170 Estrogen receptor positive status [ER+]: Secondary | ICD-10-CM

## 2021-12-25 DIAGNOSIS — Z885 Allergy status to narcotic agent status: Secondary | ICD-10-CM | POA: Diagnosis not present

## 2021-12-25 DIAGNOSIS — Z806 Family history of leukemia: Secondary | ICD-10-CM | POA: Diagnosis not present

## 2021-12-25 DIAGNOSIS — E538 Deficiency of other specified B group vitamins: Secondary | ICD-10-CM | POA: Diagnosis not present

## 2021-12-25 DIAGNOSIS — R232 Flushing: Secondary | ICD-10-CM | POA: Insufficient documentation

## 2021-12-25 DIAGNOSIS — M2560 Stiffness of unspecified joint, not elsewhere classified: Secondary | ICD-10-CM | POA: Insufficient documentation

## 2021-12-25 DIAGNOSIS — R6882 Decreased libido: Secondary | ICD-10-CM | POA: Diagnosis not present

## 2021-12-25 DIAGNOSIS — C50511 Malignant neoplasm of lower-outer quadrant of right female breast: Secondary | ICD-10-CM

## 2021-12-25 DIAGNOSIS — M85852 Other specified disorders of bone density and structure, left thigh: Secondary | ICD-10-CM | POA: Insufficient documentation

## 2021-12-25 DIAGNOSIS — Z923 Personal history of irradiation: Secondary | ICD-10-CM | POA: Insufficient documentation

## 2021-12-25 DIAGNOSIS — E2839 Other primary ovarian failure: Secondary | ICD-10-CM | POA: Diagnosis not present

## 2021-12-25 DIAGNOSIS — Z79811 Long term (current) use of aromatase inhibitors: Secondary | ICD-10-CM | POA: Insufficient documentation

## 2021-12-25 DIAGNOSIS — Z8042 Family history of malignant neoplasm of prostate: Secondary | ICD-10-CM | POA: Insufficient documentation

## 2021-12-25 DIAGNOSIS — F419 Anxiety disorder, unspecified: Secondary | ICD-10-CM | POA: Diagnosis not present

## 2021-12-25 DIAGNOSIS — Z79899 Other long term (current) drug therapy: Secondary | ICD-10-CM | POA: Diagnosis not present

## 2021-12-25 DIAGNOSIS — Z800 Family history of malignant neoplasm of digestive organs: Secondary | ICD-10-CM | POA: Insufficient documentation

## 2021-12-25 DIAGNOSIS — Z803 Family history of malignant neoplasm of breast: Secondary | ICD-10-CM | POA: Insufficient documentation

## 2021-12-25 DIAGNOSIS — F32A Depression, unspecified: Secondary | ICD-10-CM | POA: Insufficient documentation

## 2021-12-25 DIAGNOSIS — Z8041 Family history of malignant neoplasm of ovary: Secondary | ICD-10-CM | POA: Diagnosis not present

## 2021-12-25 LAB — CMP (CANCER CENTER ONLY)
ALT: 16 U/L (ref 0–44)
AST: 19 U/L (ref 15–41)
Albumin: 4.4 g/dL (ref 3.5–5.0)
Alkaline Phosphatase: 74 U/L (ref 38–126)
Anion gap: 8 (ref 5–15)
BUN: 12 mg/dL (ref 8–23)
CO2: 26 mmol/L (ref 22–32)
Calcium: 9.5 mg/dL (ref 8.9–10.3)
Chloride: 107 mmol/L (ref 98–111)
Creatinine: 0.82 mg/dL (ref 0.44–1.00)
GFR, Estimated: >60 mL/min (ref >60)
Glucose, Bld: 91 mg/dL (ref 70–99)
Potassium: 4 mmol/L (ref 3.5–5.1)
Sodium: 141 mmol/L (ref 135–145)
Total Bilirubin: 0.5 mg/dL (ref 0.3–1.2)
Total Protein: 7.2 g/dL (ref 6.5–8.1)

## 2021-12-25 LAB — CBC WITH DIFFERENTIAL (CANCER CENTER ONLY)
Abs Immature Granulocytes: 0.01 10*3/uL (ref 0.00–0.07)
Basophils Absolute: 0 10*3/uL (ref 0.0–0.1)
Basophils Relative: 1 %
Eosinophils Absolute: 0.2 10*3/uL (ref 0.0–0.5)
Eosinophils Relative: 3 %
HCT: 38.7 % (ref 36.0–46.0)
Hemoglobin: 13.3 g/dL (ref 12.0–15.0)
Immature Granulocytes: 0 %
Lymphocytes Relative: 37 %
Lymphs Abs: 2.4 10*3/uL (ref 0.7–4.0)
MCH: 32.2 pg (ref 26.0–34.0)
MCHC: 34.4 g/dL (ref 30.0–36.0)
MCV: 93.7 fL (ref 80.0–100.0)
Monocytes Absolute: 0.3 10*3/uL (ref 0.1–1.0)
Monocytes Relative: 5 %
Neutro Abs: 3.5 10*3/uL (ref 1.7–7.7)
Neutrophils Relative %: 54 %
Platelet Count: 256 10*3/uL (ref 150–400)
RBC: 4.13 MIL/uL (ref 3.87–5.11)
RDW: 13.1 % (ref 11.5–15.5)
WBC Count: 6.4 10*3/uL (ref 4.0–10.5)
nRBC: 0 % (ref 0.0–0.2)

## 2021-12-25 NOTE — Progress Notes (Signed)
[place]   [phone]) [phone] Fax:(336) [phone]   Clinic Follow up Note   Patient Care Team: [name], [name], MD as PCP - General (Family Medicine) Rolm Bookbinder, MD as Consulting Physician (Dermatology) [name], RN as Oncology Nurse Navigator [name], RN as Oncology Nurse Navigator [name], MD as Consulting Physician (General Surgery) Truitt Merle, MD as Consulting Physician (Hematology) [name], MD as Consulting Physician (Radiation Oncology) Alla Feeling, NP as Nurse Practitioner (Nurse Practitioner)  Date of Service:  12/25/2021  CHIEF COMPLAINT: f/u of right breast cancer  CURRENT THERAPY:  Antiestrogen therapy, started 02/2020  -currently exemestane since 05/2021  ASSESSMENT & PLAN:  Jean Morgan is a 61 y.o. female with   1. 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  -She underwent right breast lumpectomy with Dr Marlou Starks on 04/19/20 and adjuvant Radiation with Dr Lisbeth Renshaw.  -She started anastrozole before surgery in 02/2020. She has multiple mild side effects, including low libido, skin and eye dryness, hair thinning, hot flashes and joint stiffness, most of them are tolerable. We switched her to exemestane in 05/2021, but she is still experiencing joint stiffness. She feels this is more tolerable than anastrozole. -her most recent mammogram on 02/06/21 showed an abnormality in the right breast. Biopsy on 02/08/21 was benign. -She is clinically doing well. Lab reviewed, her CBC and CMP are within normal limits. Her physical exam was unremarkable. There is no clinical concern for recurrence. -Continue surveillance. Next mammogram in 01/2022. Will obtain DEXA at the same time. -continue exemestane. -F/u in 6 months.    2. Antiestrogen side effects Management -She has multiple mild side effects from anastrozole: low libido,  skin and eye dryness, hair thinning, hot flashes and joint stiffness.  -she switched to exemestane on 06/24/21. She reports continued joint stiffness, no other side effects mentioned.   3. Osteopenia -She was recently diagnosed by 12/2019 DEXA. Will repeat in 2023; I ordered today.  -Will monitor on AI which can weaken her bone.  -continue Calcium with Vit D.   4. Covid-19+ 02/19/21 -experienced cough, nasal congestion, post nasal drip, dry throat. -vaccinated x3, most recently 01/04/21 -she has recovered well, taste has returned.   5. 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 She was recently found to have elevated B12 and told to stop her supplement. She notes increased fatigue without it. I recommend she take every other day. 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 S/p removal.  -Continue Celexa, mood stable, did not get worse after she started anastrozole.   6. Genetic Testing negative for pathogenetic mutations     PLAN:  -continue exemestane -mammogram scheduled 02/07/22 -DEXA to be done at same time as mammogram, I ordered today. -Lab and F/u with NP Lacie in 6 months    No problem-specific Assessment & Plan notes found for this encounter.   SUMMARY OF ONCOLOGIC HISTORY: Oncology History Overview Note  Cancer Staging Malignant neoplasm of lower-outer quadrant of right breast of female, estrogen receptor positive ([place]) Staging form: Breast, AJCC 8th Edition - Clinical stage from 01/31/2020: Stage IB (cT2, cN0, cM0, G2, ER+, PR+, HER2-) - Signed by Truitt Merle, MD on 02/07/2020    Malignant neoplasm of lower-outer quadrant of right breast of female, estrogen receptor positive ([place])  01/23/2020 Imaging  DEXA 01/23/20  Osteopenia with lowest T-score -1.6 at left femur neck  Site Region Measured Date Measured Age YA BMD Significant CHANGE T-score DualFemur Neck Left   01/23/2020    60.1         -1.6    0.815 g/cm2   AP Spine  L1-L4 (L3) 01/23/2020    60.1         -1.1    1.035 g/cm2   DualFemur Total Mean 01/23/2020    60.1         -1.2    0.851 g/cm2   01/23/2020 Mammogram   Diangostic Mammogram 01/23/20  IMPRESSION: 1. There is a highly suspicious mass in the right breast at 6 o'clock 3 cm from the nipple demonstrates an irregular hypoechoic shadowing mass measuring 2.3 x 1.4 x 1.7 cm associated with pleomorphic calcifications.   2.  No evidence of right axillary lymphadenopathy.   3. There is a benign-appearing cyst in the superior left breast at 12:30, 3 cm from the nipple demonstrates an anechoic oval circumscribed mass measuring 0.9 x 0.8 x 0.9 cm. No evidence of left breast malignancy.    01/31/2020 Cancer Staging   Staging form: Breast, AJCC 8th Edition - Clinical stage from 01/31/2020: Stage IB (cT2, cN0, cM0, G2, ER+, PR+, HER2-) - Signed by Truitt Merle, MD on 02/07/2020    01/31/2020 Initial Biopsy   Diagnosis 01/31/20 Breast, right, needle core biopsy, 6 o'clock - INVASIVE MAMMARY CARCINOMA AND MAMMARY CARCINOMA IN SITU WITH PERINEURAL INVASION AND CALCIFICATIONS. Microscopic Comment The carcinoma is nuclear grade 2. The greatest linear extent of tumor in any one core is 16 mm. Immunohistochemistry of E-cadherin will be reported separately. Ancillary studies will be reported separately.   01/31/2020 Receptors her2   PROGNOSTIC INDICATORS Results: IMMUNOHISTOCHEMICAL AND MORPHOMETRIC ANALYSIS PERFORMED MANUALLY The tumor cells are NEGATIVE for Her2 (1+). Estrogen Receptor: 100%, POSITIVE, STRONG STAINING INTENSITY Progesterone Receptor: 80%, POSITIVE, STRONG STAINING INTENSITY Proliferation Marker Ki67: 15%   01/31/2020 Oncotype testing   Low risk with recurrence score 17.  Distant risk of recurrence at 9 years 5% with AI or Tamoxifen. There is less than 1% benefit of chemotherapy.    02/02/2020 Initial Diagnosis   Malignant neoplasm of  lower-outer quadrant of right breast of female, estrogen receptor positive ([place])   02/17/2020 Imaging   Breast MRI  IMPRESSION: Known malignancy in the LOWER central portion of the RIGHT breast measuring 3.1 centimeters.   No adenopathy in the RIGHT axilla.   No additional areas of concern are identified in either breast.     02/27/2020 -  Neo-Adjuvant Anti-estrogen oral therapy   Anastrozole 1m once daily starting in 02/27/20 with half tablet to see if tolerable.    02/28/2020 Genetic Testing   Negative genetic testing:  No pathogenic variants detected on the [facility] Multi-Cancer Panel. Two variants of uncertain significance were detected - one in the HOXB13 gene called c.215G>T and one in the RNF43 gene called c.2139A>T. The report date is 02/28/2020.  The Multi-Cancer Panel offered by [facility] includes sequencing and/or deletion duplication testing of the following 85 genes: AIP, ALK, APC, ATM, AXIN2,BAP1,  BARD1, BLM, BMPR1A, BRCA1, BRCA2, BRIP1, CASR, CDC73, CDH1, CDK4, CDKN1B, CDKN1C, CDKN2A (p14ARF), CDKN2A (p16INK4a), CEBPA, CHEK2, CTNNA1, DICER1, DIS3L2, EGFR (c.2369C>T, p.Thr790Met variant only), EPCAM (Deletion/duplication testing only), FH, FLCN, GATA2, GPC3, GREM1 (Promoter region deletion/duplication testing only), HOXB13 (c.251G>A, p.Gly84Glu), HRAS, KIT, MAX, MEN1, MET, MITF (c.952G>A, p.Glu318Lys variant only), MLH1, MSH2, MSH3, MSH6, MUTYH, NBN, NF1, NF2, NTHL1,  PALB2, PDGFRA, PHOX2B, PMS2, POLD1, POLE, POT1, PRKAR1A, PTCH1, PTEN, RAD50, RAD51C, RAD51D, RB1, RECQL4, RET, RNF43, RUNX1, SDHAF2, SDHA (sequence changes only), SDHB, SDHC, SDHD, SMAD4, SMARCA4, SMARCB1, SMARCE1, STK11, SUFU, TERC, TERT, TMEM127, TP53, TSC1, TSC2, VHL, WRN and WT1.    04/19/2020 Surgery   RIGHT BREAST LUMPECTOMY WITH RADIOACTIVE SEED AND SENTINEL LYMPH NODE BIOPSY by Dr Marlou Starks    04/19/2020 Pathology Results   FINAL MICROSCOPIC DIAGNOSIS:   A. BREAST, RIGHT, LUMPECTOMY:  -  Invasive ductal carcinoma,  Nottingham grade 2 of 3, 2.2 cm  -  Ductal carcinoma in-situ  -  Calcifications associated with carcinoma  -  Margins uninvolved by carcinoma (< 0.1 cm; posterior margin)  -  Previous biopsy site changes present  -  See oncology table and comment below   B. LYMPH NODE, RIGHT AXILLARY #1, SENTINEL, BIOPSY:  -  No carcinoma identified in one lymph node (0/1)  -  See comment   C. LYMPH NODE, RIGHT AXILLARY, SENTINEL, BIOPSY:  -  No carcinoma identified in one lymph node (0/1)  -  See comment   D. LYMPH NODE, RIGHT AXILLARY #2, SENTINEL, BIOPSY:  -  No carcinoma identified in one lymph node (0/1)  -  See comment   COMMENT:   A.  The previous biopsy and E-cadherin immunohistochemistry  ([id number]) was reviewed in conjunction with this case and supports  the above interpretation.   B-D. Given the lobular-like morphology, cytokeratin AE1/3 was performed  on the sentinel lymph nodes to exclude micrometastasis.  There is no  evidence of metastatic carcinoma by immunohistochemistry.    05/28/2020 - 06/22/2020 Radiation Therapy   Adjuvant Radiation with Dr Lisbeth Renshaw    09/26/2020 Survivorship   SCP delivered by Cira Rue, NP       INTERVAL HISTORY:  Deshonda Cryderman is here for a follow up of breast cancer. She was last seen by me on 06/24/21. She presents to the clinic alone. She reports she is still having joint pain despite switching to exemestane. She notes the stiffness is mainly in the morning and in her extremities. I asked her which she felt she tolerated better, she reports she has less knee pain on the exemestane. She does note she has gained more weight on exemestane.   All other systems were reviewed with the patient and are negative.  MEDICAL HISTORY:  Past Medical History:  Diagnosis Date   Anxiety    Cancer ([place])    skin   Depression    Family history of adverse reaction to anesthesia    brother had severe agitation coming out of anesthesia, had to be re-intubated    Family history of breast cancer    Family history of colon cancer    Family history of leukemia    Family history of ovarian cancer    Family history of prostate cancer    Family history of skin cancer    Heart murmur    "slight"   Hypothyroidism    Optic neuritis    Personal history of radiation therapy    [place] spotted fever    1970's    SURGICAL HISTORY: Past Surgical History:  Procedure Laterality Date   BREAST BIOPSY Right 02/01/2020   BREAST LUMPECTOMY Right 04/19/2020   BREAST LUMPECTOMY WITH RADIOACTIVE SEED AND SENTINEL LYMPH NODE BIOPSY Right 04/19/2020   Procedure: RIGHT BREAST LUMPECTOMY WITH RADIOACTIVE SEED AND SENTINEL LYMPH NODE BIOPSY;  Surgeon: [name], MD;  Location: [facility];  Service: General;  Laterality: Right;  [place] EXCISION  2006   Right forearm   SENTINEL NODE BIOPSY Right 2006   TONSILLECTOMY     TONSILLECTOMY      I have reviewed the social history and family history with the patient and they are unchanged from previous note.  ALLERGIES:  is allergic to latex and oxycodone.  MEDICATIONS:  Current Outpatient Medications  Medication Sig Dispense Refill   Calcium-Vitamin D-Vitamin K [id number] MG-UNT-MCG CHEW Chew 2 each by mouth daily.     cetirizine (ZYRTEC) 5 MG tablet Take 5 mg by mouth daily as needed for allergies.     citalopram (CELEXA) 20 MG tablet TAKE 1 TABLET BY MOUTH EVERY DAY 90 tablet 2   diazepam (VALIUM) 5 MG tablet Take 0.5-1 tablets (2.5-5 mg total) by mouth every 12 (twelve) hours as needed for anxiety. 30 tablet 1   exemestane (AROMASIN) 25 MG tablet TAKE 1 TABLET BY MOUTH DAILY AFTER BREAKFAST. 90 tablet 1   fluticasone (FLONASE) 50 MCG/ACT nasal spray PLACE 1 SPRAY INTO BOTH NOSTRILS 2 (TWO) TIMES DAILY 16 mL 1   TURMERIC PO Take by mouth.     valACYclovir (VALTREX) 1000 MG tablet TAKE 1 TABLET ONCE DAILY FOR 5 DAYS AS NEEDED, START ASAP WITHIN 48 HOURS OF ONSET OF SYMPTOMS 90  tablet 1   No current facility-administered medications for this visit.    PHYSICAL EXAMINATION: ECOG PERFORMANCE STATUS: 1 - Symptomatic but completely ambulatory  Vitals:   12/25/21 0836  BP: 135/77  Pulse: 71  Resp: 16  Temp: 98.6 F (37 C)  SpO2: 100%   Wt Readings from Last 3 Encounters:  12/25/21 156 lb 9.6 oz (71 kg)  11/04/21 157 lb 6 oz (71.4 kg)  07/22/21 148 lb 8 oz (67.4 kg)     GENERAL:alert, no distress and comfortable SKIN: skin color, texture, turgor are normal, no rashes or significant lesions EYES: normal, Conjunctiva are pink and non-injected, sclera clear  NECK: supple, thyroid normal size, non-tender, without nodularity LYMPH:  no palpable lymphadenopathy in the cervical, axillary  ABDOMEN:abdomen soft, non-tender and normal bowel sounds Musculoskeletal:no cyanosis of digits and no clubbing  NEURO: alert & oriented x 3 with fluent speech, no focal motor/sensory deficits BREAST: No palpable mass, nodules or adenopathy bilaterally. Breast exam benign.   LABORATORY DATA:  I have reviewed the data as listed CBC Latest Ref Rng & Units 12/25/2021 06/24/2021 12/19/2020  WBC 4.0 - 10.5 K/uL 6.4 7.2 7.6  Hemoglobin 12.0 - 15.0 g/dL 13.3 13.3 13.3  Hematocrit 36.0 - 46.0 % 38.7 38.5 39.5  Platelets 150 - 400 K/uL 256 262 266     CMP Latest Ref Rng & Units 12/25/2021 06/24/2021 01/25/2021  Glucose 70 - 99 mg/dL 91 106(H) 93  BUN 8 - 23 mg/dL _0 Creatinine 0.44 - 1.00 mg/dL 0.82 0.79 0.73  Sodium 135 - 145 mmol/L 141 141 140  Potassium 3.5 - 5.1 mmol/L 4.0 4.1 3.6  Chloride 98 - 111 mmol/L 107 108 103  CO2 22 - 32 mmol/L _1 Calcium 8.9 - 10.3 mg/dL 9.5 9.4 9.5  Total Protein 6.5 - 8.1 g/dL 7.2 7.1 -  Total Bilirubin 0.3 - 1.2 mg/dL 0.5 0.5 -  Alkaline Phos 38 - 126 U/L 74 78 -  AST 15 - 41 U/L 19 20 -  ALT 0 - 44 U/L 16 15 -      RADIOGRAPHIC STUDIES:  I have personally reviewed the radiological images as listed and agreed with the findings  in the report. No results found.    Orders Placed This Encounter  Procedures   DG Bone Density    Standing Status:   Future    Order Specific Question:   Reason for Exam (SYMPTOM  OR DIAGNOSIS REQUIRED)    Answer:   screening    Order Specific Question:   Preferred imaging location?    Answer:   [facility]   All questions were answered. The patient knows to call the clinic with any problems, questions or concerns. No barriers to learning was detected. The total time spent in the appointment was 30 minutes.     Truitt Merle, MD 12/25/2021   I, Wilburn Mylar, am acting as scribe for Truitt Merle, MD.   I have reviewed the above documentation for accuracy and completeness, and I agree with the above.

## 2021-12-26 ENCOUNTER — Telehealth: Payer: Self-pay | Admitting: Hematology

## 2021-12-26 NOTE — Telephone Encounter (Signed)
Scheduled appointment per 03/01 los. Left message with appointment details.  ?

## 2022-01-06 ENCOUNTER — Other Ambulatory Visit: Payer: Self-pay | Admitting: Family Medicine

## 2022-01-17 ENCOUNTER — Other Ambulatory Visit: Payer: Self-pay | Admitting: Family Medicine

## 2022-01-17 DIAGNOSIS — F419 Anxiety disorder, unspecified: Secondary | ICD-10-CM

## 2022-02-07 ENCOUNTER — Other Ambulatory Visit: Payer: Self-pay | Admitting: Hematology

## 2022-02-07 ENCOUNTER — Ambulatory Visit
Admission: RE | Admit: 2022-02-07 | Discharge: 2022-02-07 | Disposition: A | Discharge status: Home / Self Care | Payer: BC Managed Care – PPO | Source: Ambulatory Visit | Attending: Hematology | Admitting: Hematology

## 2022-02-07 DIAGNOSIS — R921 Mammographic calcification found on diagnostic imaging of breast: Secondary | ICD-10-CM

## 2022-02-07 DIAGNOSIS — Z9889 Other specified postprocedural states: Secondary | ICD-10-CM

## 2022-02-12 ENCOUNTER — Ambulatory Visit
Admission: RE | Admit: 2022-02-12 | Discharge: 2022-02-12 | Disposition: A | Discharge status: Home / Self Care | Payer: BC Managed Care – PPO | Source: Ambulatory Visit | Attending: Hematology | Admitting: Hematology

## 2022-02-12 DIAGNOSIS — R921 Mammographic calcification found on diagnostic imaging of breast: Secondary | ICD-10-CM

## 2022-02-17 ENCOUNTER — Ambulatory Visit: Payer: BC Managed Care – PPO | Attending: General Surgery

## 2022-02-17 VITALS — Wt 157.2 lb

## 2022-02-17 DIAGNOSIS — Z483 Aftercare following surgery for neoplasm: Secondary | ICD-10-CM | POA: Insufficient documentation

## 2022-02-17 NOTE — Therapy (Signed)
?OUTPATIENT PHYSICAL THERAPY SOZO SCREENING NOTE ? ? ?Patient Name: Jean Morgan ?MRN: [id number] ?DOB:[birth year], 61 y.o., female ?Today's Date: 02/17/2022 ? ?PCP: [name], [name], MD ?REFERRING PROVIDER: [name], MD ? ? PT End of Session - 02/17/22 1548   ? ? Visit Number 25   # unchanged due to screen only  ? PT Start Time 1546   ? PT Stop Time 1550   ? PT Time Calculation (min) 4 min   ? Activity Tolerance Patient tolerated treatment well   ? Behavior During Therapy [facility] for tasks assessed/performed   ? ?  ?  ? ?  ? ? ?Past Medical History:  ?Diagnosis Date  ? Anxiety   ? Cancer [facility])   ? skin  ? Depression   ? Family history of adverse reaction to anesthesia   ? brother had severe agitation coming out of anesthesia, had to be re-intubated  ? Family history of breast cancer   ? Family history of colon cancer   ? Family history of leukemia   ? Family history of ovarian cancer   ? Family history of prostate cancer   ? Family history of skin cancer   ? Heart murmur   ? "slight"  ? Hypothyroidism   ? Optic neuritis   ? Personal history of radiation therapy   ? [place] spotted fever   ? 1970's  ? ?Past Surgical History:  ?Procedure Laterality Date  ? BREAST BIOPSY Right 02/01/2020  ? BREAST LUMPECTOMY Right 04/19/2020  ? BREAST LUMPECTOMY WITH RADIOACTIVE SEED AND SENTINEL LYMPH NODE BIOPSY Right 04/19/2020  ? Procedure: RIGHT BREAST LUMPECTOMY WITH RADIOACTIVE SEED AND SENTINEL LYMPH NODE BIOPSY;  Surgeon: [name], MD;  Location: [place];  Service: General;  Laterality: Right;  PEC BLOCK  ? DENTAL SURGERY    ? MELANOMA EXCISION  2006  ? Right forearm  ? SENTINEL NODE BIOPSY Right 2006  ? TONSILLECTOMY    ? TONSILLECTOMY    ? ?Patient Active Problem List  ? Diagnosis Date Noted  ? PAD (peripheral artery disease) ([place]) 07/09/2020  ? Carpal tunnel syndrome 03/09/2020  ? Genetic testing 02/29/2020  ? Family history of breast cancer   ? Family history of ovarian cancer   ? Family history of colon cancer    ? Family history of prostate cancer   ? Family history of skin cancer   ? Family history of leukemia   ? Malignant neoplasm of lower-outer quadrant of right breast of female, estrogen receptor positive ([place]) 02/02/2020  ? Osteopenia 01/25/2020  ? B12 deficiency 01/04/2020  ? Hearing loss 08/18/2018  ? Hyperlipidemia 11/28/2016  ? Anxiety disorder 11/28/2016  ? Chronic fatigue 11/28/2016  ? Recurrent genital herpes 11/28/2016  ? Melanoma of skin, site unspecified 06/26/2013  ? ? ?REFERRING DIAG: right breast cancer at risk for lymphedema ? ?THERAPY DIAG:  ?Aftercare following surgery for neoplasm ? ?PERTINENT HISTORY: Diagnosis of 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 Radiation completed   has started anastrozole ? ?PRECAUTIONS: right UE Lymphedema risk, None ? ?SUBJECTIVE: Pt returns for her 3 month L-Dex screen.  ? ?PAIN:  ?Are you having pain? No ? ?SOZO SCREENING: ?Patient was assessed today using the SOZO machine to determine the lymphedema index score. This was compared to her baseline score. It was determined that she is  within the recommended range when compared to her baseline and no further action is needed at this time. She will continue SOZO screenings. These are done every 3 months for 2 years post operatively followed by every 6 months for 2 years, and then annually. Reprinted self MLD breast handout for pt per her request.  ? ? ? ?Otelia Limes, PTA ?02/17/2022, 3:55 PM ? ?  ? ?

## 2022-02-17 NOTE — Patient Instructions (Signed)
Self manual lymph drainage: ?Perform this sequence once a day.  Only give enough pressure to your skin to make the skin move. ? ?Diaphragmatic - Supine ? ? ?Inhale through nose making navel move out toward hands. Exhale through puckered lips, hands follow navel in. ?Repeat _5__ times. Rest _10__ seconds between repeats.  ? ?Copyright ? VHI. All rights reserved.  ?Hug yourself.  Do circles at your neck just above your collarbones.  Repeat this 10 times. ? ?Axilla - One at a Time ? ? ?Using full weight of flat hand and fingers at center of uninvolved armpit, make _10__ in-place circles.  ? ?Copyright ? VHI. All rights reserved.  ?LEG: Inguinal Nodes Stimulation ? ? ?With small finger side of hand against hip crease on involved side, gently perform circles at the crease. ?Repeat __10_ times.  ? ?Copyright ? VHI. All rights reserved.  ?Axilla to Inguinal Nodes - Sweep ? ? ?On involved side, sweep _4__ times from armpit along side of trunk to hip crease. ? ?Now gently stretch skin from the involved side to the uninvolved side across the chest at the shoulder line.  Repeat that 4 times. ? ?Draw an imaginary diagonal line from upper outer breast through the nipple area toward lower inner breast.  Direct fluid upward and inward from this line toward the pathway across your upper chest .  Do this in three rows to treat all of the upper inner breast tissue, and do each row 3-4x. ?     Direct fluid to treat all of lower outer breast tissue downward and outward toward  pathway that is aimed at the left groin. ? ?Finish by doing the pathways as described above going from your involved armpit to the same side groin and going across your upper chest from the involved shoulder to the uninvolved shoulder. ? ?Repeat the steps above where you do circles in your right groin and left armpit. ?Copyright ? VHI. All rights reserved.   ?

## 2022-03-07 ENCOUNTER — Other Ambulatory Visit: Payer: Self-pay | Admitting: Hematology

## 2022-03-07 NOTE — Progress Notes (Signed)
? ? ?ACUTE VISIT ?Chief Complaint  ?Patient presents with  ? Vaginitis  ?  X a week, having burning & itching. Started using monistat Friday.  ? ?HPI: ?Jean Morgan is a 61 y.o. female, who is here today complaining of vaginal pruritus and burning sensation with urination as described above. ?No other urinary symptom. ?She started monistat vaginal cream 3 days ago and she feels like it has helped some. ? ?Vaginal Itching ?The patient's primary symptoms include genital itching. The patient's pertinent negatives include no genital lesions, genital odor, genital rash, pelvic pain, vaginal bleeding or vaginal discharge. This is a new problem. The current episode started in the past 7 days. The problem occurs constantly. The problem has been gradually improving. The patient is experiencing no pain. Associated symptoms include dysuria. Pertinent negatives include no abdominal pain, back pain, chills, constipation, diarrhea, fever, flank pain, frequency, headaches, hematuria, nausea, rash, urgency or vomiting. Her past medical history is significant for a gynecological surgery and herpes simplex.  ?Hx of genital herpes, she takes Valtrex as needed. ? ?Right breast cancer, s/p lumpectomy+adjuvant radiation, and currently on Exemestane 25 mg daily  which is causing arthralgias. ?Frustrated because has had another abnormal mammogram and breast bx again, fortunately it was negative. ? ?Anxiety on Celexa 20 mg daily and Valium 5 mg daily as needed. ?Medications do help. ? ?She is not on statin med, which was recommended in 01/2021. She was concerned about possible side effects. ?PAD, ABI in 05/2020 otherwise normal except for right toe-brachial index, abnormal. ? ?Lab Results  ?Component Value Date  ? CHOL 287 (H) 01/25/2021  ? HDL 75.60 01/25/2021  ? LDLCALC 197 (H) 01/25/2021  ? TRIG 75.0 01/25/2021  ? CHOLHDL 4 01/25/2021  ? ?Review of Systems  ?Constitutional:  Positive for fatigue. Negative for activity change, chills  and fever.  ?Respiratory:  Negative for shortness of breath.   ?Cardiovascular:  Negative for chest pain, palpitations and leg swelling.  ?Gastrointestinal:  Negative for abdominal pain, constipation, diarrhea, nausea and vomiting.  ?Genitourinary:  Positive for dysuria. Negative for flank pain, frequency, hematuria, pelvic pain, urgency and vaginal discharge.  ?Musculoskeletal:  Negative for back pain.  ?Skin:  Negative for rash.  ?Neurological:  Negative for headaches.  ?Rest see pertinent positives and negatives per HPI. ? ?Current Outpatient Medications on File Prior to Visit  ?Medication Sig Dispense Refill  ? Calcium-Vitamin D-Vitamin K [id number] MG-UNT-MCG CHEW Chew 2 each by mouth daily.    ? cetirizine (ZYRTEC) 5 MG tablet Take 5 mg by mouth daily as needed for allergies.    ? citalopram (CELEXA) 20 MG tablet TAKE 1 TABLET BY MOUTH EVERY DAY 90 tablet 2  ? diazepam (VALIUM) 5 MG tablet Take 0.5-1 tablets (2.5-5 mg total) by mouth every 12 (twelve) hours as needed for anxiety. 30 tablet 1  ? exemestane (AROMASIN) 25 MG tablet TAKE 1 TABLET BY MOUTH DAILY AFTER BREAKFAST. 90 tablet 1  ? GARLIC-LECITHIN PO Take 2 tablets by mouth daily.    ? QUERCETIN PO Take 2 tablets by mouth daily.    ? TURMERIC PO Take 2 tablets by mouth daily.    ? valACYclovir (VALTREX) 1000 MG tablet TAKE 1 TABLET ONCE DAILY FOR 5 DAYS AS NEEDED, START ASAP WITHIN 48 HOURS OF ONSET OF SYMPTOMS 90 tablet 1  ? fluticasone (FLONASE) 50 MCG/ACT nasal spray PLACE 1 SPRAY INTO BOTH NOSTRILS 2 (TWO) TIMES DAILY 16 mL 1  ? ?No current facility-administered medications on file prior  to visit.  ? ?Past Medical History:  ?Diagnosis Date  ? Anxiety   ? Cancer [facility])   ? skin  ? Depression   ? Family history of adverse reaction to anesthesia   ? brother had severe agitation coming out of anesthesia, had to be re-intubated  ? Family history of breast cancer   ? Family history of colon cancer   ? Family history of leukemia   ? Family history of  ovarian cancer   ? Family history of prostate cancer   ? Family history of skin cancer   ? Heart murmur   ? "slight"  ? Hypothyroidism   ? Optic neuritis   ? Personal history of radiation therapy   ? [place] spotted fever   ? 1970's  ? ?Allergies  ?Allergen Reactions  ? Latex Itching  ? Oxycodone Other (See Comments)  ?  Other Reaction: "too strong a drug for me"  ? ?Social History  ? ?Socioeconomic History  ? Marital status: Married  ?  Spouse name: Not on file  ? Number of children: 1  ? Years of education: Not on file  ? Highest education level: Not on file  ?Occupational History  ? Not on file  ?Tobacco Use  ? Smoking status: Never  ? Smokeless tobacco: Never  ?Vaping Use  ? Vaping Use: Never used  ?Substance and Sexual Activity  ? Alcohol use: Yes  ?  Alcohol/week: 7.0 standard drinks  ?  Types: 7 Glasses of wine per week  ? Drug use: No  ? Sexual activity: Not on file  ?Other Topics Concern  ? Not on file  ?Social History Narrative  ? Not on file  ? ?Social Determinants of Health  ? ?Financial Resource Strain: Not on file  ?Food Insecurity: Not on file  ?Transportation Needs: Not on file  ?Physical Activity: Not on file  ?Stress: Not on file  ?Social Connections: Not on file  ? ?Vitals:  ? 03/10/22 0710  ?BP: 128/80  ?Pulse: 88  ?Resp: 16  ?Temp: 98.3 ?F (36.8 ?C)  ?SpO2: 99%  ? ?Body mass index is 25.26 kg/m?. ? ?Physical Exam ?Vitals and nursing note reviewed. Exam conducted with a chaperone present.  ?Constitutional:   ?   General: She is not in acute distress. ?   Appearance: She is well-developed.  ?HENT:  ?   Head: Normocephalic and atraumatic.  ?Eyes:  ?   Conjunctiva/sclera: Conjunctivae normal.  ?Cardiovascular:  ?   Rate and Rhythm: Normal rate and regular rhythm.  ?   Heart sounds: No murmur heard. ?Pulmonary:  ?   Effort: Pulmonary effort is normal. No respiratory distress.  ?   Breath sounds: Normal breath sounds.  ?Abdominal:  ?   Palpations: Abdomen is soft. There is no mass.  ?    Tenderness: There is no abdominal tenderness.  ?Genitourinary: ?   Exam position: Lithotomy position.  ?   Labia:     ?   Right: No rash, tenderness, lesion or injury.     ?   Left: No rash, tenderness, lesion or injury.   ?   Vagina: Erythema present. No vaginal discharge, tenderness, bleeding or lesions.  ?   Comments: Vaginal mucosa atrophic. ?Cream in vagina (monistat) ?Skin: ?   General: Skin is warm.  ?   Findings: No erythema.  ?Neurological:  ?   Mental Status: She is alert and oriented to person, place, and time.  ?Psychiatric:     ?  Mood and Affect: Mood is anxious.  ? ?ASSESSMENT AND PLAN: ? ?Jean Morgan was seen today for vaginitis. ? ?Diagnoses and all orders for this visit: ? ?Vaginal pruritus ?We discussed possible etiologies. ?Candidal vulvovaginitis is in the differential Dx and so is vaginal atrophy. ?Slightly improved with vaginal monistat, so complete 7 days treatment. ?Diflucan 150 mg x 1. ? ?-     fluconazole (DIFLUCAN) 150 MG tablet; Take 1 tablet (150 mg total) by mouth once for 1 dose. ?Vaginal atrophy ?Educated about Dx,prognosis,and treatment options. ?Vaginal Premarin could be an option, we discussed risks given her hx of breast cancer.This could be discussed with ehr gyn. ? ?Anxiety disorder, unspecified type ?Stable. ?Continue Celexa 20 mg daily and Valium 5 mg 1/2-1 tab daily as needed. ? ?PAD (peripheral artery disease) ([place]) ?She is not interested in statin. ?Would like to hold on re-checking FLP during her CPE. ? ?I spent a total of 31 minutes in both face to face and non face to face activities for this visit on the date of this encounter. During this time history was obtained and documented, examination was performed, prior labs/imaging reviewed, and assessment/plan discussed. ? ?Return if symptoms worsen or fail to improve. ? ?Tenzin Edelman G. [name], MD ? ?Ord. ?[place] office. ? ? ?

## 2022-03-10 ENCOUNTER — Ambulatory Visit: Payer: BC Managed Care – PPO | Admitting: Family Medicine

## 2022-03-10 ENCOUNTER — Encounter: Payer: Self-pay | Admitting: Family Medicine

## 2022-03-10 VITALS — BP 128/80 | HR 88 | Temp 98.3°F | Resp 16 | Ht 66.0 in | Wt 156.5 lb

## 2022-03-10 DIAGNOSIS — N898 Other specified noninflammatory disorders of vagina: Secondary | ICD-10-CM

## 2022-03-10 DIAGNOSIS — F419 Anxiety disorder, unspecified: Secondary | ICD-10-CM

## 2022-03-10 DIAGNOSIS — N952 Postmenopausal atrophic vaginitis: Secondary | ICD-10-CM | POA: Diagnosis not present

## 2022-03-10 DIAGNOSIS — I739 Peripheral vascular disease, unspecified: Secondary | ICD-10-CM

## 2022-03-10 MED ORDER — FLUCONAZOLE 150 MG PO TABS: 150.0000 mg | ORAL_TABLET | Freq: Once | ORAL | 0 refills | Status: AC

## 2022-03-10 NOTE — Patient Instructions (Addendum)
A few things to remember from today's visit: ? ?Vaginal atrophy ? ?Vaginal pruritus ? ?If you need refills please call your pharmacy. ?Do not use My Chart to request refills or for acute issues that need immediate attention. ?  ?Will treat as yeast but vaginal atrophy can cause similar symptoms. ?Diflucan 1 time. ?Complete treatment with monistat. ? ?Please be sure medication list is accurate. ?If a new problem present, please set up appointment sooner than planned today. ? ? ?Atrophic Vaginitis ? ?Atrophic vaginitis is when the lining of the vagina becomes dry and thin. ?This is most common in women who have stopped having their periods (are in menopause). It usually starts when a woman is 67 to 62 years old. ?What are the causes? ?This condition is caused by a drop in a female hormone (estrogen). ?What increases the risk? ?You are more likely to develop this condition if: ?You take certain medicines. ?You have had your ovaries taken out. ?You are being treated for cancer. ?You have given birth or are breastfeeding. ?You are more than 61 years old. ?You smoke. ?What are the signs or symptoms? ?Pain during sex. ?A feeling of pressure during sex. ?Bleeding during sex. ?Burning or itching in the vagina. ?Burning pain when you pee (urinate). ?Fluid coming from your vagina. ?Some people do not have symptoms. ?How is this treated? ?Using a lubricant before sex. ?Using a moisturizer in the vagina. ?Using estrogen in the vagina. ?In some cases, you may not need treatment. ?Follow these instructions at home: ?Medicines ?Take all medicines only as told by your doctor. This includes medicines for dryness. ?Do not use herbal medicines unless your doctor says it is okay. ?General instructions ?Talk with your doctor about treatment. ?Do not douche. ?Do not use scented: ?Sprays. ?Tampons. ?Soaps. ?If sex hurts, try using lubricants right before you have sex. ?Contact a doctor if: ?You have fluid coming from the vagina that is not  like normal. ?You have a bad smell coming from your vagina. ?You have new symptoms. ?Your symptoms do not get better when treated. ?Your symptoms get worse. ?Summary ?This condition happens when the lining of the vagina becomes dry and thin. ?It is most common in women who no longer have periods. ?Treatment may include using medicines for dryness. ?Call a doctor if your symptoms do not get better. ?This information is not intended to replace advice given to you by your health care provider. Make sure you discuss any questions you have with your health care provider. ?Document Revised: 04/12/2020 Document Reviewed: 04/12/2020 ?Elsevier Patient Education ? [place]. ? ? ? ? ? ?

## 2022-03-12 ENCOUNTER — Telehealth: Payer: Self-pay

## 2022-03-12 ENCOUNTER — Ambulatory Visit: Payer: BC Managed Care – PPO | Admitting: Family Medicine

## 2022-03-12 ENCOUNTER — Encounter: Payer: Self-pay | Admitting: Family Medicine

## 2022-03-12 VITALS — BP 124/70 | HR 83 | Resp 16 | Ht 66.0 in | Wt 156.0 lb

## 2022-03-12 DIAGNOSIS — R1030 Lower abdominal pain, unspecified: Secondary | ICD-10-CM

## 2022-03-12 DIAGNOSIS — N390 Urinary tract infection, site not specified: Secondary | ICD-10-CM | POA: Diagnosis not present

## 2022-03-12 DIAGNOSIS — M545 Low back pain, unspecified: Secondary | ICD-10-CM

## 2022-03-12 DIAGNOSIS — R350 Frequency of micturition: Secondary | ICD-10-CM | POA: Diagnosis not present

## 2022-03-12 LAB — POCT URINALYSIS DIPSTICK
Bilirubin, UA: NEGATIVE
Blood, UA: NEGATIVE
Glucose, UA: NEGATIVE
Ketones, UA: NEGATIVE
Nitrite, UA: NEGATIVE
Protein, UA: NEGATIVE
Spec Grav, UA: 1.01 (ref 1.010–1.025)
Urobilinogen, UA: 0.2 E.U./dL
pH, UA: 6 (ref 5.0–8.0)

## 2022-03-12 LAB — URINALYSIS, ROUTINE W REFLEX MICROSCOPIC
Bilirubin Urine: NEGATIVE
Hgb urine dipstick: NEGATIVE
Ketones, ur: NEGATIVE
Nitrite: NEGATIVE
RBC / HPF: NONE SEEN (ref 0–?)
Specific Gravity, Urine: <=1.005 — AB (ref 1.000–1.030)
Total Protein, Urine: NEGATIVE
Urine Glucose: NEGATIVE
Urobilinogen, UA: 0.2 (ref 0.0–1.0)
pH: 6 (ref 5.0–8.0)

## 2022-03-12 MED ORDER — NITROFURANTOIN MONOHYD MACRO 100 MG PO CAPS: 100.0000 mg | ORAL_CAPSULE | Freq: Two times a day (BID) | ORAL | 0 refills | Status: AC

## 2022-03-12 NOTE — Telephone Encounter (Signed)
I spoke with patient. Scheduled for 12pm today. ?

## 2022-03-12 NOTE — Telephone Encounter (Signed)
This nurse received a message from this patient stating that she saw her primary care physician related to a yeast infection and was informed that she may have some vaginal atrophy and it is very painful.  Was advised to follow up with the Oncologist for suggestions.  This information has been forwarded to MD for recommendations.    ?

## 2022-03-12 NOTE — Telephone Encounter (Signed)
--  Caller states she was seen on Monday for yeast ?infection. She is taking Premarin for breast cancer. She ?took Diflucan. has pressure in her abd. having urinary ?frequency. Still has burning and itching. no fever. ? ?03/12/2022 7:53:41 AM See HCP within 4 Hours (or PCP triage) Raphael Gibney, RN, Vanita Ingles ? ?Comments ?User: Dannielle Burn, RN Date/Time Eilene Ghazi Time): 03/12/2022 7:53:21 AM ?please call pt back regarding appt. ? ?Referrals ?REFERRED TO PCP OFFICE ?

## 2022-03-12 NOTE — Progress Notes (Signed)
ACUTE VISIT Chief Complaint  Patient presents with   Urinary Frequency    Started with abdominal pain & urinary frequency yesterday. A twinge of back pain.   HPI: [name] is a 61 y.o. female with history of hyperlipidemia, anxiety, PAD, and right breast cancer here today complaining of urinary symptoms as described above. She was seen on 03/10/2022, when she was complaining about vaginal pruritus.  She stopped using Monistat cream thinking it may be causing urinary symptoms. Yesterday she started with suprapubic abdominal pressure and some periumbilical abdominal discomfort.  Urinary Frequency  This is a new problem. The current episode started yesterday. The problem occurs intermittently. The problem has been gradually worsening. The quality of the pain is described as burning. The pain is moderate. There has been no fever. She is Sexually active. There is No history of pyelonephritis. Associated symptoms include frequency, hesitancy, nausea and urgency. Pertinent negatives include no chills, discharge, flank pain, hematuria, sweats or vomiting. She has tried nothing for the symptoms.  She has had some constipation, last bowel movement this morning, she did not feel like she emptied. Mild nausea when she first took Diflucan. Left-sided back pain, no radiated. Negative for saddle anesthesia or bowel/bladder dysfunction.  She has not tried OTC medication.  Review of Systems  Constitutional:  Positive for fatigue. Negative for chills.  Gastrointestinal:  Positive for nausea. Negative for vomiting.  Genitourinary:  Positive for frequency, hesitancy and urgency. Negative for flank pain, hematuria, vaginal bleeding and vaginal discharge.  Skin:  Negative for rash.  Neurological:  Negative for syncope, weakness and numbness.  Rest see pertinent positives and negatives per HPI.  Current Outpatient Medications on File Prior to Visit  Medication Sig Dispense Refill    Calcium-Vitamin D-Vitamin K [id number] MG-UNT-MCG CHEW Chew 2 each by mouth daily.     cetirizine (ZYRTEC) 5 MG tablet Take 5 mg by mouth daily as needed for allergies.     citalopram (CELEXA) 20 MG tablet TAKE 1 TABLET BY MOUTH EVERY DAY 90 tablet 2   diazepam (VALIUM) 5 MG tablet Take 0.5-1 tablets (2.5-5 mg total) by mouth every 12 (twelve) hours as needed for anxiety. 30 tablet 1   exemestane (AROMASIN) 25 MG tablet TAKE 1 TABLET BY MOUTH DAILY AFTER BREAKFAST. 90 tablet 1   GARLIC-LECITHIN PO Take 2 tablets by mouth daily.     QUERCETIN PO Take 2 tablets by mouth daily.     TURMERIC PO Take 2 tablets by mouth daily.     valACYclovir (VALTREX) 1000 MG tablet TAKE 1 TABLET ONCE DAILY FOR 5 DAYS AS NEEDED, START ASAP WITHIN 48 HOURS OF ONSET OF SYMPTOMS 90 tablet 1   No current facility-administered medications on file prior to visit.   Past Medical History:  Diagnosis Date   Anxiety    Cancer ([place])    skin   Depression    Family history of adverse reaction to anesthesia    brother had severe agitation coming out of anesthesia, had to be re-intubated   Family history of breast cancer    Family history of colon cancer    Family history of leukemia    Family history of ovarian cancer    Family history of prostate cancer    Family history of skin cancer    Heart murmur    "slight"   Hypothyroidism    Optic neuritis    Personal history of radiation therapy    [facility] spotted fever  1970's   Allergies  Allergen Reactions   Latex Itching   Oxycodone Other (See Comments)    Other Reaction: "too strong a drug for me"    Social History   Socioeconomic History   Marital status: Married    Spouse name: Not on file   Number of children: 1   Years of education: Not on file   Highest education level: Not on file  Occupational History   Not on file  Tobacco Use   Smoking status: Never   Smokeless tobacco: Never  Vaping Use   Vaping Use: Never used  Substance and  Sexual Activity   Alcohol use: Yes    Alcohol/week: 7.0 standard drinks    Types: 7 Glasses of wine per week   Drug use: No   Sexual activity: Not on file  Other Topics Concern   Not on file  Social History Narrative   Not on file   Social Determinants of Health   Financial Resource Strain: Not on file  Food Insecurity: Not on file  Transportation Needs: Not on file  Physical Activity: Not on file  Stress: Not on file  Social Connections: Not on file    Vitals:   03/12/22 1146  BP: 124/70  Pulse: 83  Resp: 16  SpO2: 99%   Body mass index is 25.18 kg/m.  Physical Exam Vitals and nursing note reviewed.  Constitutional:      General: She is not in acute distress.    Appearance: She is well-developed.  HENT:     Head: Normocephalic and atraumatic.     Mouth/Throat:     Mouth: Mucous membranes are moist.  Eyes:     Conjunctiva/sclera: Conjunctivae normal.  Cardiovascular:     Rate and Rhythm: Normal rate and regular rhythm.  Pulmonary:     Effort: Pulmonary effort is normal. No respiratory distress.     Breath sounds: Normal breath sounds.  Abdominal:     Palpations: Abdomen is soft. There is no mass.     Tenderness: There is abdominal tenderness (LLQ mainly.) in the suprapubic area and left lower quadrant. There is no right CVA tenderness, left CVA tenderness, guarding or rebound.  Musculoskeletal:     Lumbar back: Tenderness present. No bony tenderness.       Back:  Lymphadenopathy:     Cervical: No cervical adenopathy.  Skin:    General: Skin is warm.     Findings: No erythema.  Neurological:     Mental Status: She is alert and oriented to person, place, and time.  Psychiatric:        Mood and Affect: Mood is anxious.   ASSESSMENT AND PLAN:  [name] was seen today for urinary frequency.  Diagnoses and all orders for this visit:  Lower abdominal pain We discussed possible etiologies. Constipation could be a contributing factor as well as  medication (Diflucan). I do not think imaging is needed at this time. Adequate fiber and fluid intake recommended to help with constipation. Instructed about warning signs.  Urinary frequency Urine dipstick positive for a small amount of leukocytes, rest otherwise negative. We will send urine for culture. Monitor for new symptoms.  Urinary tract infection without hematuria, site unspecified Empiric abx treatment with Macrobid started today. We will tailor treatment according to Ucx results and susceptibility report.  Clearly instructed about warning signs. F/U if symptoms persist.  -     nitrofurantoin, macrocrystal-monohydrate, (MACROBID) 100 MG capsule; Take 1 capsule (100 mg total) by mouth  2 (two) times daily for 5 days.  Left-sided low back pain without sciatica, unspecified chronicity I do not think this is caused by infectious process, it seems to be musculoskeletal. Local heat and massage can be tried if persistent.  Return if symptoms worsen or fail to improve.  Keiera Strathman G. [name], MD  [facility]. [place] office.

## 2022-03-12 NOTE — Patient Instructions (Signed)
A few things to remember from today's visit: ? ? ?Urinary frequency - Plan: POCT urinalysis dipstick, Culture, Urine, Urinalysis with Reflex Microscopic, Urinalysis with Reflex Microscopic, Culture, Urine ? ?Lower abdominal pain ? ?Urinary tract infection without hematuria, site unspecified - Plan: nitrofurantoin, macrocrystal-monohydrate, (MACROBID) 100 MG capsule ? ?If you need refills please call your pharmacy. ?Do not use My Chart to request refills or for acute issues that need immediate attention. ?  ?Adequate fluid intake, avoid holding urine for long hours, and over the counter Vit C OR cranberry capsules might help. ? ?Today we will treat empirically with antibiotic, which we might need to change when urine culture comes back depending of bacteria susceptibility. ? ?Seek immediate medical attention if severe abdominal pain, vomiting, fever/chills, or worsening symptoms. ?F/U if symptomatic are not any better after 2-3 days of antibiotic treatment. ? ?Constipation can be contributing to abdominal pain. ?Increase fiber and fluid intake. ? ?Please be sure medication list is accurate. ?If a new problem present, please set up appointment sooner than planned today. ? ? ? ? ? ? ? ?

## 2022-03-13 ENCOUNTER — Encounter: Payer: Self-pay | Admitting: Family Medicine

## 2022-03-13 LAB — URINE CULTURE
MICRO NUMBER:: 13409013
SPECIMEN QUALITY:: ADEQUATE

## 2022-03-18 ENCOUNTER — Encounter: Payer: Self-pay | Admitting: Nurse Practitioner

## 2022-03-18 ENCOUNTER — Telehealth: Payer: Self-pay

## 2022-03-18 NOTE — Progress Notes (Signed)
HPI: Jean Morgan is a 61 y.o. female, who is here today for her routine physical.  Last CPE: 01/25/21  She is not exercising regularly. Following a healthy diet: Cooks her meals. Chicken and fish mainly as well as vegetables and fruits. Drinks a couple of glasses of wine a few times per week.  Chronic medical problems: Recurrent genital herpes,PAD,osteopenia,knee OA, right breast cancer s/p lumpectomy and radiation,B12 def,and fatigue among some.  Pap smear: 05/04/20 negative for malignancy and HPV. Hx of abnormal pap smears: Negative.  Immunization History  Administered Date(s) Administered   PFIZER(Purple Top)SARS-COV-2 Vaccination 06/09/2020   Health Maintenance  Topic Date Due   COVID-19 Vaccine (2 - Pfizer risk series) 04/03/2022 (Originally 06/30/2020)   Zoster Vaccines- Shingrix (1 of 2) 06/18/2022 (Originally 11/19/1978)   TETANUS/TDAP  03/19/2023 (Originally 11/19/1978)   HIV Screening  08/19/2023 (Originally 11/19/1974)   INFLUENZA VACCINE  05/27/2022   PAP SMEAR-Modifier  05/05/2023   MAMMOGRAM  02/08/2024   COLONOSCOPY (Pts 45-65yr Insurance coverage will need to be confirmed)  10/02/2024   Hepatitis C Screening  Completed   HPV VACCINES  Aged Out   Fatigue, this is a chronic problem.It has been worse since dx;ed with breast cancer. Sleeps 5-6 hours. Would like thyroid check as well as B12.  B12 deficiency: She is not on B12 supplementation. Lab Results  Component Value Date   VITAMINB12 >1506 (H) 01/25/2021   HLD and PAD: She is not on pharmacologic treatment.  Lab Results  Component Value Date   CHOL 287 (H) 01/25/2021   HDL 75.60 01/25/2021   LDLCALC 197 (H) 01/25/2021   TRIG 75.0 01/25/2021   CHOLHDL 4 01/25/2021   Lab Results  Component Value Date   HGBA1C 5.5 01/25/2021   Vaginal atrophy: Oncologist recommended gyn consultation and no vaginal premarin.  Review of Systems  Constitutional:  Positive for fatigue. Negative for appetite change  and fever.  HENT:  Negative for hearing loss, mouth sores, sore throat, trouble swallowing and voice change.   Eyes:  Negative for redness and visual disturbance.  Respiratory:  Negative for cough, shortness of breath and wheezing.   Cardiovascular:  Negative for chest pain and leg swelling.  Gastrointestinal:  Negative for abdominal pain, nausea and vomiting.       No changes in bowel habits.  Endocrine: Negative for cold intolerance, heat intolerance, polydipsia, polyphagia and polyuria.  Genitourinary:  Negative for decreased urine volume, dysuria, hematuria, vaginal bleeding and vaginal discharge.  Musculoskeletal:  Positive for arthralgias. Negative for gait problem.  Skin:  Negative for color change and rash.  Allergic/Immunologic: Negative for environmental allergies.  Neurological:  Negative for syncope, weakness and headaches.  Hematological:  Negative for adenopathy. Does not bruise/bleed easily.  Psychiatric/Behavioral:  Positive for sleep disturbance. Negative for confusion. The patient is nervous/anxious.   All other systems reviewed and are negative.  Current Outpatient Medications on File Prior to Visit  Medication Sig Dispense Refill   Calcium-Vitamin D-Vitamin K 5631-497-02MG-UNT-MCG CHEW Chew 2 each by mouth daily.     cetirizine (ZYRTEC) 5 MG tablet Take 5 mg by mouth daily as needed for allergies.     citalopram (CELEXA) 20 MG tablet TAKE 1 TABLET BY MOUTH EVERY DAY 90 tablet 2   diazepam (VALIUM) 5 MG tablet Take 0.5-1 tablets (2.5-5 mg total) by mouth every 12 (twelve) hours as needed for anxiety. 30 tablet 1   exemestane (AROMASIN) 25 MG tablet TAKE 1 TABLET BY MOUTH DAILY AFTER BREAKFAST.  90 tablet 1   GARLIC-LECITHIN PO Take 2 tablets by mouth daily.     QUERCETIN PO Take 2 tablets by mouth daily.     TURMERIC PO Take 2 tablets by mouth daily.     valACYclovir (VALTREX) 1000 MG tablet TAKE 1 TABLET ONCE DAILY FOR 5 DAYS AS NEEDED, START ASAP WITHIN 48 HOURS OF  ONSET OF SYMPTOMS 90 tablet 1   No current facility-administered medications on file prior to visit.   Past Medical History:  Diagnosis Date   Anxiety    Cancer ([place])    skin   Depression    Family history of adverse reaction to anesthesia    brother had severe agitation coming out of anesthesia, had to be re-intubated   Family history of breast cancer    Family history of colon cancer    Family history of leukemia    Family history of ovarian cancer    Family history of prostate cancer    Family history of skin cancer    Heart murmur    "slight"   Hypothyroidism    Optic neuritis    Personal history of radiation therapy    [place] spotted fever    1970's   Past Surgical History:  Procedure Laterality Date   BREAST BIOPSY Right 02/01/2020   BREAST LUMPECTOMY Right 04/19/2020   BREAST LUMPECTOMY WITH RADIOACTIVE SEED AND SENTINEL LYMPH NODE BIOPSY Right 04/19/2020   Procedure: RIGHT BREAST LUMPECTOMY WITH RADIOACTIVE SEED AND SENTINEL LYMPH NODE BIOPSY;  Surgeon: [name], MD;  Location: [facility];  Service: General;  Laterality: Right;  [place]  2006   Right forearm   SENTINEL NODE BIOPSY Right 2006   TONSILLECTOMY     TONSILLECTOMY     Allergies  Allergen Reactions   Latex Itching   Oxycodone Other (See Comments)    Other Reaction: "too strong a drug for me"   Family History  Problem Relation Age of Onset   Colon polyps Mother        more than 63   Colitis Mother    Arthritis Mother    Hyperlipidemia Mother    Diabetes Mother    Skin cancer Mother    Heart disease Father    Hyperlipidemia Father    Hypertension Father    Colon cancer Paternal Aunt        dx. in her 90s   Diabetes Maternal Grandmother    Breast cancer Maternal Grandmother        dx. >50   Breast cancer Other 55       maternal great-aunt   Ovarian cancer Other 53   Colon cancer Other 61   Skin cancer Other 53   Prostate cancer  Half-Brother 77   Leukemia Maternal Great-grandmother 83       chronic   Social History   Socioeconomic History   Marital status: Married    Spouse name: Not on file   Number of children: 1   Years of education: Not on file   Highest education level: Not on file  Occupational History   Not on file  Tobacco Use   Smoking status: Never   Smokeless tobacco: Never  Vaping Use   Vaping Use: Never used  Substance and Sexual Activity   Alcohol use: Yes    Alcohol/week: 7.0 standard drinks    Types: 7 Glasses of wine per week   Drug use: No  Sexual activity: Not on file  Other Topics Concern   Not on file  Social History Narrative   Not on file   Social Determinants of Health   Financial Resource Strain: Not on file  Food Insecurity: Not on file  Transportation Needs: Not on file  Physical Activity: Not on file  Stress: Not on file  Social Connections: Not on file   Vitals:   03/19/22 0735  BP: 120/70  Pulse: 82  Resp: 16  Temp: 98.3 F (36.8 C)  SpO2: 99%  Body mass index is 25.26 kg/m. Wt Readings from Last 3 Encounters:  03/19/22 156 lb 8 oz (71 kg)  03/12/22 156 lb (70.8 kg)  03/10/22 156 lb 8 oz (71 kg)   Physical Exam Vitals and nursing note reviewed.  Constitutional:      General: She is not in acute distress.    Appearance: She is well-developed.  HENT:     Head: Normocephalic and atraumatic.     Right Ear: Hearing, tympanic membrane, ear canal and external ear normal.     Left Ear: Hearing, tympanic membrane, ear canal and external ear normal.     Mouth/Throat:     Mouth: Mucous membranes are moist.     Pharynx: Oropharynx is clear. Uvula midline.  Eyes:     Extraocular Movements: Extraocular movements intact.     Conjunctiva/sclera: Conjunctivae normal.     Pupils: Pupils are equal, round, and reactive to light.  Neck:     Thyroid: No thyroid mass.     Trachea: No tracheal deviation.  Cardiovascular:     Rate and Rhythm: Normal rate and  regular rhythm.     Pulses:          Dorsalis pedis pulses are 2+ on the right side and 2+ on the left side.     Heart sounds: No murmur heard. Pulmonary:     Effort: Pulmonary effort is normal. No respiratory distress.     Breath sounds: Normal breath sounds.  Abdominal:     Palpations: Abdomen is soft. There is no hepatomegaly or mass.     Tenderness: There is no abdominal tenderness.  Genitourinary:    Comments: Deferred to gyn. Musculoskeletal:     Comments: No signs of synovitis appreciated.  Lymphadenopathy:     Cervical: No cervical adenopathy.  Skin:    General: Skin is warm.     Findings: No erythema or rash.  Neurological:     General: No focal deficit present.     Mental Status: She is alert and oriented to person, place, and time.     Cranial Nerves: No cranial nerve deficit.     Coordination: Coordination normal.     Gait: Gait normal.     Deep Tendon Reflexes:     Reflex Scores:      Bicep reflexes are 2+ on the right side and 2+ on the left side.      Patellar reflexes are 2+ on the right side and 2+ on the left side. Psychiatric:     Comments: Well groomed, good eye contact.   ASSESSMENT AND PLAN:  Jean Morgan was here today annual physical examination.  Orders Placed This Encounter  Procedures   Vitamin B12   TSH   Lipid panel   Comprehensive metabolic panel   Lab Results  Component Value Date   CREATININE 0.77 03/19/2022   BUN 8 03/19/2022   NA 138 03/19/2022   K 4.1 03/19/2022  CL 102 03/19/2022   CO2 28 03/19/2022   Lab Results  Component Value Date   ALT 14 03/19/2022   AST 16 03/19/2022   ALKPHOS 91 03/19/2022   BILITOT 0.6 03/19/2022   Lab Results  Component Value Date   CHOL 247 (H) 03/19/2022   HDL 53.40 03/19/2022   LDLCALC 177 (H) 03/19/2022   TRIG 84.0 03/19/2022   CHOLHDL 5 03/19/2022   Lab Results  Component Value Date   TSH 2.79 03/19/2022   Lab Results  Component Value Date   VITAMINB12 >1504 (H)  03/19/2022   Routine general medical examination at a health care facility We discussed the importance of regular physical activity and healthy diet for prevention of chronic illness and/or complications. Preventive guidelines reviewed. Vaccination: Declined vaccinations. Ca++ and vit D supplementation to continue. She will establish with gyn to address vaginal atrophy and continue female preventive care. Next CPE in a year. The 10-year ASCVD risk score (Arnett DK, et al., 2019) is: 4.2%   Values used to calculate the score:     Age: 30 years     Sex: Female     Is Non-Hispanic African American: No     Diabetic: No     Tobacco smoker: No     Systolic Blood Pressure: 751 mmHg     Is BP treated: No     HDL Cholesterol: 53.4 mg/dL     Total Cholesterol: 247 mg/dL  Screening for endocrine, metabolic and immunity disorder -     Comprehensive metabolic panel  Fatigue, unspecified type Chronic. Some of her health problems can be contributing factors. Further recommendations according to lab results.  B12 deficiency Further recommendations according to B 12 result.  Hyperlipidemia LDL goal < 100 at least. We discussed CV benefits from statin medications. Will make recommendations about medication and dose according to lipid panel numbers.  Return in 6 months (on 09/19/2022) for F/U.  Shaneece Stockburger G. [name], MD  [facility]. [place] office.

## 2022-03-18 NOTE — Telephone Encounter (Signed)
This nurse reached out to patient related to concerns of Vaginal Atrophy.  Advised per provider to try over the counter [place] jelly or mineral oil.  Also to speak with her GYN about vaginal dilation or MonaLisa Touch.  Patient acknowledged understanding and states that she does not have a Editor, commissioning at this time but she will speak with her primary care about referring her.  No further questions or concerns at this time.

## 2022-03-19 ENCOUNTER — Ambulatory Visit (INDEPENDENT_AMBULATORY_CARE_PROVIDER_SITE_OTHER): Payer: BC Managed Care – PPO | Admitting: Family Medicine

## 2022-03-19 ENCOUNTER — Encounter: Payer: Self-pay | Admitting: Family Medicine

## 2022-03-19 VITALS — BP 120/70 | HR 82 | Temp 98.3°F | Resp 16 | Ht 66.0 in | Wt 156.5 lb

## 2022-03-19 DIAGNOSIS — E538 Deficiency of other specified B group vitamins: Secondary | ICD-10-CM

## 2022-03-19 DIAGNOSIS — Z0000 Encounter for general adult medical examination without abnormal findings: Secondary | ICD-10-CM | POA: Diagnosis not present

## 2022-03-19 DIAGNOSIS — Z1329 Encounter for screening for other suspected endocrine disorder: Secondary | ICD-10-CM | POA: Diagnosis not present

## 2022-03-19 DIAGNOSIS — R5383 Other fatigue: Secondary | ICD-10-CM

## 2022-03-19 DIAGNOSIS — E785 Hyperlipidemia, unspecified: Secondary | ICD-10-CM

## 2022-03-19 DIAGNOSIS — Z130 Encounter for screening for diseases of the blood and blood-forming organs and certain disorders involving the immune mechanism: Secondary | ICD-10-CM | POA: Diagnosis not present

## 2022-03-19 DIAGNOSIS — Z13228 Encounter for screening for other metabolic disorders: Secondary | ICD-10-CM | POA: Diagnosis not present

## 2022-03-19 LAB — COMPREHENSIVE METABOLIC PANEL
ALT: 14 U/L (ref 0–35)
AST: 16 U/L (ref 0–37)
Albumin: 4.6 g/dL (ref 3.5–5.2)
Alkaline Phosphatase: 91 U/L (ref 39–117)
BUN: 8 mg/dL (ref 6–23)
CO2: 28 mEq/L (ref 19–32)
Calcium: 9.6 mg/dL (ref 8.4–10.5)
Chloride: 102 mEq/L (ref 96–112)
Creatinine, Ser: 0.77 mg/dL (ref 0.40–1.20)
GFR: 82.73 mL/min (ref >60.00)
Glucose, Bld: 99 mg/dL (ref 70–99)
Potassium: 4.1 mEq/L (ref 3.5–5.1)
Sodium: 138 mEq/L (ref 135–145)
Total Bilirubin: 0.6 mg/dL (ref 0.2–1.2)
Total Protein: 7.3 g/dL (ref 6.0–8.3)

## 2022-03-19 LAB — LIPID PANEL
Cholesterol: 247 mg/dL — ABNORMAL HIGH (ref 0–200)
HDL: 53.4 mg/dL (ref >39.00)
LDL Cholesterol: 177 mg/dL — ABNORMAL HIGH (ref 0–99)
NonHDL: 194.05
Total CHOL/HDL Ratio: 5
Triglycerides: 84 mg/dL (ref 0.0–149.0)
VLDL: 16.8 mg/dL (ref 0.0–40.0)

## 2022-03-19 LAB — VITAMIN B12: Vitamin B-12: >1504 pg/mL — ABNORMAL HIGH (ref 211–911)

## 2022-03-19 LAB — TSH: TSH: 2.79 u[IU]/mL (ref 0.35–5.50)

## 2022-03-19 NOTE — Patient Instructions (Addendum)
A few things to remember from today's visit:  Routine general medical examination at a health care facility  Screening for endocrine, metabolic and immunity disorder - Plan: Comprehensive metabolic panel  Hyperlipidemia, unspecified hyperlipidemia type - Plan: Lipid panel  Fatigue, unspecified type - Plan: TSH  B12 deficiency - Plan: Vitamin B12  If you need refills please call your pharmacy. Do not use My Chart to request refills or for acute issues that need immediate attention.   Night splint ,right wrist may help with carpal tunnel synd symptoms.  Please be sure medication list is accurate. If a new problem present, please set up appointment sooner than planned today.   Health Maintenance, Female Adopting a healthy lifestyle and getting preventive care are important in promoting health and wellness. Ask your health care provider about: The right schedule for you to have regular tests and exams. Things you can do on your own to prevent diseases and keep yourself healthy. What should I know about diet, weight, and exercise? Eat a healthy diet  Eat a diet that includes plenty of vegetables, fruits, low-fat dairy products, and lean protein. Do not eat a lot of foods that are high in solid fats, added sugars, or sodium. Maintain a healthy weight Body mass index (BMI) is used to identify weight problems. It estimates body fat based on height and weight. Your health care provider can help determine your BMI and help you achieve or maintain a healthy weight. Get regular exercise Get regular exercise. This is one of the most important things you can do for your health. Most adults should: Exercise for at least 150 minutes each week. The exercise should increase your heart rate and make you sweat (moderate-intensity exercise). Do strengthening exercises at least twice a week. This is in addition to the moderate-intensity exercise. Spend less time sitting. Even light physical activity  can be beneficial. Watch cholesterol and blood lipids Have your blood tested for lipids and cholesterol at 62 years of age, then have this test every 5 years. Have your cholesterol levels checked more often if: Your lipid or cholesterol levels are high. You are older than 61 years of age. You are at high risk for heart disease. What should I know about cancer screening? Depending on your health history and family history, you may need to have cancer screening at various ages. This may include screening for: Breast cancer. Cervical cancer. Colorectal cancer. Skin cancer. Lung cancer. What should I know about heart disease, diabetes, and high blood pressure? Blood pressure and heart disease High blood pressure causes heart disease and increases the risk of stroke. This is more likely to develop in people who have high blood pressure readings or are overweight. Have your blood pressure checked: Every 3-5 years if you are 23-57 years of age. Every year if you are 52 years old or older. Diabetes Have regular diabetes screenings. This checks your fasting blood sugar level. Have the screening done: Once every three years after age 17 if you are at a normal weight and have a low risk for diabetes. More often and at a younger age if you are overweight or have a high risk for diabetes. What should I know about preventing infection? Hepatitis B If you have a higher risk for hepatitis B, you should be screened for this virus. Talk with your health care provider to find out if you are at risk for hepatitis B infection. Hepatitis C Testing is recommended for: Everyone born from [birth year] through 1966.  Anyone with known risk factors for hepatitis C. Sexually transmitted infections (STIs) Get screened for STIs, including gonorrhea and chlamydia, if: You are sexually active and are younger than 62 years of age. You are older than 62 years of age and your health care provider tells you that you are at  risk for this type of infection. Your sexual activity has changed since you were last screened, and you are at increased risk for chlamydia or gonorrhea. Ask your health care provider if you are at risk. Ask your health care provider about whether you are at high risk for HIV. Your health care provider may recommend a prescription medicine to help prevent HIV infection. If you choose to take medicine to prevent HIV, you should first get tested for HIV. You should then be tested every 3 months for as long as you are taking the medicine. Pregnancy If you are about to stop having your period (premenopausal) and you may become pregnant, seek counseling before you get pregnant. Take 400 to 800 micrograms (mcg) of folic acid every day if you become pregnant. Ask for birth control (contraception) if you want to prevent pregnancy. Osteoporosis and menopause Osteoporosis is a disease in which the bones lose minerals and strength with aging. This can result in bone fractures. If you are 72 years old or older, or if you are at risk for osteoporosis and fractures, ask your health care provider if you should: Be screened for bone loss. Take a calcium or vitamin D supplement to lower your risk of fractures. Be given hormone replacement therapy (HRT) to treat symptoms of menopause. Follow these instructions at home: Alcohol use Do not drink alcohol if: Your health care provider tells you not to drink. You are pregnant, may be pregnant, or are planning to become pregnant. If you drink alcohol: Limit how much you have to: 0-1 drink a day. Know how much alcohol is in your drink. In the [place] one drink equals one 12 oz bottle of beer (355 mL), one 5 oz glass of wine (148 mL), or one 1 oz glass of hard liquor (44 mL). Lifestyle Do not use any products that contain nicotine or tobacco. These products include cigarettes, chewing tobacco, and vaping devices, such as e-cigarettes. If you need help quitting, ask your  health care provider. Do not use street drugs. Do not share needles. Ask your health care provider for help if you need support or information about quitting drugs. General instructions Schedule regular health, dental, and eye exams. Stay current with your vaccines. Tell your health care provider if: You often feel depressed. You have ever been abused or do not feel safe at home. Summary Adopting a healthy lifestyle and getting preventive care are important in promoting health and wellness. Follow your health care provider's instructions about healthy diet, exercising, and getting tested or screened for diseases. Follow your health care provider's instructions on monitoring your cholesterol and blood pressure. This information is not intended to replace advice given to you by your health care provider. Make sure you discuss any questions you have with your health care provider. Document Revised: 03/04/2021 Document Reviewed: 03/04/2021 Elsevier Patient Education  [place].

## 2022-03-19 NOTE — Assessment & Plan Note (Addendum)
LDL goal < 100 at least. We discussed CV benefits from statin medications. Will make recommendations about medication and dose according to lipid panel numbers.

## 2022-03-24 ENCOUNTER — Encounter: Payer: Self-pay | Admitting: Family Medicine

## 2022-03-26 ENCOUNTER — Encounter: Payer: Self-pay | Admitting: Family Medicine

## 2022-06-09 ENCOUNTER — Ambulatory Visit: Payer: BC Managed Care – PPO | Attending: General Surgery

## 2022-06-09 VITALS — Wt 157.5 lb

## 2022-06-09 DIAGNOSIS — Z483 Aftercare following surgery for neoplasm: Secondary | ICD-10-CM | POA: Insufficient documentation

## 2022-06-09 NOTE — Therapy (Signed)
OUTPATIENT PHYSICAL THERAPY SOZO SCREENING NOTE   Patient Name: Jean Morgan MRN: [id number] DOB:31-May-1961, 61 y.o., female Today's Date: 06/09/2022  PCP: [name], [name], MD REFERRING PROVIDER: [name], MD   PT End of Session - 06/09/22 1603     Visit Number 25   # unchanged due to screen only   PT Start Time 1601    PT Stop Time 1605    PT Time Calculation (min) 4 min    Activity Tolerance Patient tolerated treatment well    Behavior During Therapy WFL for tasks assessed/performed             Past Medical History:  Diagnosis Date   Anxiety    Cancer ([place])    skin   Depression    Family history of adverse reaction to anesthesia    brother had severe agitation coming out of anesthesia, had to be re-intubated   Family history of breast cancer    Family history of colon cancer    Family history of leukemia    Family history of ovarian cancer    Family history of prostate cancer    Family history of skin cancer    Heart murmur    "slight"   Hypothyroidism    Optic neuritis    Personal history of radiation therapy    [place] spotted fever    1970's   Past Surgical History:  Procedure Laterality Date   BREAST BIOPSY Right 02/01/2020   BREAST LUMPECTOMY Right 04/19/2020   BREAST LUMPECTOMY WITH RADIOACTIVE SEED AND SENTINEL LYMPH NODE BIOPSY Right 04/19/2020   Procedure: RIGHT BREAST LUMPECTOMY WITH RADIOACTIVE SEED AND SENTINEL LYMPH NODE BIOPSY;  Surgeon: [name], MD;  Location: [facility];  Service: General;  Laterality: Right;  [place]  2006   Right forearm   SENTINEL NODE BIOPSY Right 2006   TONSILLECTOMY     TONSILLECTOMY     Patient Active Problem List   Diagnosis Date Noted   PAD (peripheral artery disease) ([place]) 07/09/2020   Carpal tunnel syndrome 03/09/2020   Genetic testing 02/29/2020   Family history of breast cancer    Family history of ovarian cancer    Family history of colon cancer     Family history of prostate cancer    Family history of skin cancer    Family history of leukemia    Malignant neoplasm of lower-outer quadrant of right breast of female, estrogen receptor positive ([place]) 02/02/2020   Osteopenia 01/25/2020   B12 deficiency 01/04/2020   Hearing loss 08/18/2018   Hyperlipidemia 11/28/2016   Anxiety disorder 11/28/2016   Chronic fatigue 11/28/2016   Recurrent genital herpes 11/28/2016   Melanoma of skin, site unspecified 06/26/2013    REFERRING DIAG: right breast cancer at risk for lymphedema  THERAPY DIAG:  Aftercare following surgery for neoplasm  PERTINENT HISTORY: Diagnosis of 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 Radiation completed   has started anastrozole  PRECAUTIONS: right UE Lymphedema risk, None  SUBJECTIVE: Pt returns for her 3 month L-Dex screen.   PAIN:  Are you having pain? No  SOZO SCREENING: Patient was assessed today using the SOZO machine to determine the lymphedema index score. This was compared to her baseline score. It was determined that she is  within the recommended range when compared to her baseline and no further action is needed at this time. She will continue SOZO screenings. These are done every 3 months for 2 years post operatively followed by every 6 months for 2 years, and then annually. Reprinted self MLD breast handout for pt per her request.    L-DEX FLOWSHEETS - 06/09/22 1600       L-DEX LYMPHEDEMA SCREENING   Measurement Type Unilateral    L-DEX MEASUREMENT EXTREMITY Upper Extremity    POSITION  Standing    DOMINANT SIDE Right    At Risk Side Right    BASELINE SCORE (UNILATERAL) -1.8    L-DEX SCORE (UNILATERAL) 0.6    VALUE CHANGE (UNILAT) 2.4              Otelia Limes, PTA 06/09/2022, 4:05 PM

## 2022-06-25 ENCOUNTER — Ambulatory Visit
Admission: RE | Admit: 2022-06-25 | Discharge: 2022-06-25 | Disposition: A | Discharge status: Home / Self Care | Payer: BC Managed Care – PPO | Source: Ambulatory Visit | Attending: Hematology | Admitting: Hematology

## 2022-06-25 DIAGNOSIS — E2839 Other primary ovarian failure: Secondary | ICD-10-CM

## 2022-06-30 ENCOUNTER — Other Ambulatory Visit: Payer: Self-pay | Admitting: Nurse Practitioner

## 2022-06-30 DIAGNOSIS — C50511 Malignant neoplasm of lower-outer quadrant of right female breast: Secondary | ICD-10-CM

## 2022-06-30 NOTE — Progress Notes (Signed)
[place]   [phone] [phone]   Clinic Follow up Note   Patient Care Team: [name], [name], MD as PCP - General (Family Medicine) Rolm Bookbinder, MD as Consulting Physician (Dermatology) [name], RN as Oncology Nurse Navigator [name], RN as Oncology Nurse Navigator [name], MD as Consulting Physician (General Surgery) Truitt Merle, MD as Consulting Physician (Hematology) [name], MD as Consulting Physician (Radiation Oncology) Alla Feeling, NP as Nurse Practitioner (Nurse Practitioner) 07/01/2022  CHIEF COMPLAINT: Follow up right breast cancer   SUMMARY OF ONCOLOGIC HISTORY: Oncology History Overview Note  Cancer Staging Malignant neoplasm of lower-outer quadrant of right breast of female, estrogen receptor positive ([place]) Staging form: Breast, AJCC 8th Edition - Clinical stage from 01/31/2020: Stage IB (cT2, cN0, cM0, G2, ER+, PR+, HER2-) - Signed by Truitt Merle, MD on 02/07/2020    Malignant neoplasm of lower-outer quadrant of right breast of female, estrogen receptor positive ([place])  01/23/2020 Imaging   DEXA 01/23/20  Osteopenia with lowest T-score -1.6 at left femur neck  Site Region Measured Date Measured Age YA BMD Significant CHANGE T-score DualFemur Neck Left  01/23/2020    60.1         -1.6    0.815 g/cm2   AP Spine  L1-L4 (L3) 01/23/2020    60.1         -1.1    1.035 g/cm2   DualFemur Total Mean 01/23/2020    60.1         -1.2    0.851 g/cm2   01/23/2020 Mammogram   Diangostic Mammogram 01/23/20  IMPRESSION: 1. There is a highly suspicious mass in the right breast at 6 o'clock 3 cm from the nipple demonstrates an irregular hypoechoic shadowing mass measuring 2.3 x 1.4 x 1.7 cm associated with pleomorphic calcifications.   2.  No evidence of right axillary lymphadenopathy.   3. There is a benign-appearing cyst in the superior left breast at 12:30, 3 cm from the nipple demonstrates an anechoic oval  circumscribed mass measuring 0.9 x 0.8 x 0.9 cm. No evidence of left breast malignancy.    01/31/2020 Cancer Staging   Staging form: Breast, AJCC 8th Edition - Clinical stage from 01/31/2020: Stage IB (cT2, cN0, cM0, G2, ER+, PR+, HER2-) - Signed by Truitt Merle, MD on 02/07/2020   01/31/2020 Initial Biopsy   Diagnosis 01/31/20 Breast, right, needle core biopsy, 6 o'clock - INVASIVE MAMMARY CARCINOMA AND MAMMARY CARCINOMA IN SITU WITH PERINEURAL INVASION AND CALCIFICATIONS. Microscopic Comment The carcinoma is nuclear grade 2. The greatest linear extent of tumor in any one core is 16 mm. Immunohistochemistry of E-cadherin will be reported separately. Ancillary studies will be reported separately.   01/31/2020 Receptors her2   PROGNOSTIC INDICATORS Results: IMMUNOHISTOCHEMICAL AND MORPHOMETRIC ANALYSIS PERFORMED MANUALLY The tumor cells are NEGATIVE for Her2 (1+). Estrogen Receptor: 100%, POSITIVE, STRONG STAINING INTENSITY Progesterone Receptor: 80%, POSITIVE, STRONG STAINING INTENSITY Proliferation Marker Ki67: 15%   01/31/2020 Oncotype testing   Low risk with recurrence score 17.  Distant risk of recurrence at 9 years 5% with AI or Tamoxifen. There is less than 1% benefit of chemotherapy.    02/02/2020 Initial Diagnosis   Malignant neoplasm of lower-outer quadrant of right breast of female, estrogen receptor positive ([place])   02/17/2020 Imaging   Breast MRI  IMPRESSION: Known malignancy in the LOWER central portion of the RIGHT breast measuring 3.1 centimeters.   No adenopathy in the RIGHT axilla.   No  additional areas of concern are identified in either breast.     02/27/2020 -  Neo-Adjuvant Anti-estrogen oral therapy   Anastrozole 64m once daily starting in 02/27/20 with half tablet to see if tolerable.    02/28/2020 Genetic Testing   Negative genetic testing:  No pathogenic variants detected on the [facility] Multi-Cancer Panel. Two variants of uncertain significance were detected - one in  the HOXB13 gene called c.215G>T and one in the RNF43 gene called c.2139A>T. The report date is 02/28/2020.  The Multi-Cancer Panel offered by [facility] includes sequencing and/or deletion duplication testing of the following 85 genes: AIP, ALK, APC, ATM, AXIN2,BAP1,  BARD1, BLM, BMPR1A, BRCA1, BRCA2, BRIP1, CASR, CDC73, CDH1, CDK4, CDKN1B, CDKN1C, CDKN2A (p14ARF), CDKN2A (p16INK4a), CEBPA, CHEK2, CTNNA1, DICER1, DIS3L2, EGFR (c.2369C>T, p.Thr790Met variant only), EPCAM (Deletion/duplication testing only), FH, FLCN, GATA2, GPC3, GREM1 (Promoter region deletion/duplication testing only), HOXB13 (c.251G>A, p.Gly84Glu), HRAS, KIT, MAX, MEN1, MET, MITF (c.952G>A, p.Glu318Lys variant only), MLH1, MSH2, MSH3, MSH6, MUTYH, NBN, NF1, NF2, NTHL1, PALB2, PDGFRA, PHOX2B, PMS2, POLD1, POLE, POT1, PRKAR1A, PTCH1, PTEN, RAD50, RAD51C, RAD51D, RB1, RECQL4, RET, RNF43, RUNX1, SDHAF2, SDHA (sequence changes only), SDHB, SDHC, SDHD, SMAD4, SMARCA4, SMARCB1, SMARCE1, STK11, SUFU, TERC, TERT, TMEM127, TP53, TSC1, TSC2, VHL, WRN and WT1.    04/19/2020 Surgery   RIGHT BREAST LUMPECTOMY WITH RADIOACTIVE SEED AND SENTINEL LYMPH NODE BIOPSY by Dr [name]   04/19/2020 Pathology Results   FINAL MICROSCOPIC DIAGNOSIS:   A. BREAST, RIGHT, LUMPECTOMY:  -  Invasive ductal carcinoma, Nottingham grade 2 of 3, 2.2 cm  -  Ductal carcinoma in-situ  -  Calcifications associated with carcinoma  -  Margins uninvolved by carcinoma (< 0.1 cm; posterior margin)  -  Previous biopsy site changes present  -  See oncology table and comment below   B. LYMPH NODE, RIGHT AXILLARY #1, SENTINEL, BIOPSY:  -  No carcinoma identified in one lymph node (0/1)  -  See comment   C. LYMPH NODE, RIGHT AXILLARY, SENTINEL, BIOPSY:  -  No carcinoma identified in one lymph node (0/1)  -  See comment   D. LYMPH NODE, RIGHT AXILLARY #2, SENTINEL, BIOPSY:  -  No carcinoma identified in one lymph node (0/1)  -  See comment   COMMENT:   A.  The previous biopsy  and E-cadherin immunohistochemistry  (([id number] was reviewed in conjunction with this case and supports  the above interpretation.   B-D. Given the lobular-like morphology, cytokeratin AE1/3 was performed  on the sentinel lymph nodes to exclude micrometastasis.  There is no  evidence of metastatic carcinoma by immunohistochemistry.    05/28/2020 - 06/22/2020 Radiation Therapy   Adjuvant Radiation with Dr MLisbeth Renshaw   09/26/2020 Survivorship   SCP delivered by LCira Rue NP      CURRENT THERAPY:  Antiestrogen therapy, started 02/2020             -currently exemestane since 05/2021  INTERVAL HISTORY: Ms. VStringfieldreturns for follow up as scheduled. Last seen by Dr. FBurr Medico3/1/23. She had an abnormal mammogram, biopsy 02/12/22 was benign, fat necrosis with dystrophic calcifications which was concordant. DEXA 06/25/22 showed osteopenia, lowest T score -1.9 at left femur neck. Frax shows 10% risk of major fracture in 10 years, 1.3% risk of hip fracture.  She takes calcium/D tablet.  She continues exemestane, knee pain resolved now only joint aches in her ankles and wrists.  She remains tired.  B12 has been high even off B12 supplement for over a year.  She has some occasional pulling sensation in the right breast and possible scar tissue near the nipple.  Denies new lump/mass, nipple discharge or inversion, or skin change.  All other systems were reviewed with the patient and are negative.  MEDICAL HISTORY:  Past Medical History:  Diagnosis Date   Anxiety    Cancer ([place])    skin   Depression    Family history of adverse reaction to anesthesia    brother had severe agitation coming out of anesthesia, had to be re-intubated   Family history of breast cancer    Family history of colon cancer    Family history of leukemia    Family history of ovarian cancer    Family history of prostate cancer    Family history of skin cancer    Heart murmur    "slight"   Hypothyroidism    Optic neuritis     Personal history of radiation therapy    [place] spotted fever    1970's    SURGICAL HISTORY: Past Surgical History:  Procedure Laterality Date   BREAST BIOPSY Right 02/01/2020   BREAST LUMPECTOMY Right 04/19/2020   BREAST LUMPECTOMY WITH RADIOACTIVE SEED AND SENTINEL LYMPH NODE BIOPSY Right 04/19/2020   Procedure: RIGHT BREAST LUMPECTOMY WITH RADIOACTIVE SEED AND SENTINEL LYMPH NODE BIOPSY;  Surgeon: [name], MD;  Location: [facility];  Service: General;  Laterality: Right;  [place]  2006   Right forearm   SENTINEL NODE BIOPSY Right 2006   TONSILLECTOMY     TONSILLECTOMY      I have reviewed the social history and family history with the patient and they are unchanged from previous note.  ALLERGIES:  is allergic to latex and oxycodone.  MEDICATIONS:  Current Outpatient Medications  Medication Sig Dispense Refill   Calcium-Vitamin D-Vitamin K 010-932-35 MG-UNT-MCG CHEW Chew 2 each by mouth daily.     cetirizine (ZYRTEC) 5 MG tablet Take 5 mg by mouth daily as needed for allergies.     citalopram (CELEXA) 20 MG tablet TAKE 1 TABLET BY MOUTH EVERY DAY 90 tablet 2   diazepam (VALIUM) 5 MG tablet Take 0.5-1 tablets (2.5-5 mg total) by mouth every 12 (twelve) hours as needed for anxiety. 30 tablet 1   exemestane (AROMASIN) 25 MG tablet TAKE 1 TABLET BY MOUTH DAILY AFTER BREAKFAST. 90 tablet 3   GARLIC-LECITHIN PO Take 2 tablets by mouth daily.     QUERCETIN PO Take 2 tablets by mouth daily.     TURMERIC PO Take 2 tablets by mouth daily.     valACYclovir (VALTREX) 1000 MG tablet TAKE 1 TABLET ONCE DAILY FOR 5 DAYS AS NEEDED, START ASAP WITHIN 48 HOURS OF ONSET OF SYMPTOMS 90 tablet 1   No current facility-administered medications for this visit.    PHYSICAL EXAMINATION: ECOG PERFORMANCE STATUS: 1 - Symptomatic but completely ambulatory  Vitals:   07/01/22 0938  BP: (!) 141/72  Pulse: 65  Resp: 18  Temp: 98 F (36.7 C)  SpO2:  99%   Filed Weights   07/01/22 0938  Weight: 158 lb 14.4 oz (72.1 kg)    GENERAL:alert, no distress and comfortable SKIN: No rash EYES: sclera clear NECK: Without mass LYMPH:  no palpable cervical or supraclavicular lymphadenopathy LUNGS:  normal breathing effort HEART: no lower extremity edema ABDOMEN:abdomen soft, non-tender and normal bowel sounds NEURO: alert & oriented x 3 with fluent speech, no focal motor/sensory deficits  Breast exam: Breasts are symmetrical without nipple discharge or inversion.  S/p right lumpectomy, incisions completely healed.  No palpable mass or nodularity in either breast or axilla that I could appreciate.  LABORATORY DATA:  I have reviewed the data as listed    Latest Ref Rng & Units 07/01/2022    9:21 AM 12/25/2021    8:14 AM 06/24/2021    8:16 AM  CBC  WBC 4.0 - 10.5 K/uL 8.5  6.4  7.2   Hemoglobin 12.0 - 15.0 g/dL 13.7  13.3  13.3   Hematocrit 36.0 - 46.0 % 40.7  38.7  38.5   Platelets 150 - 400 K/uL 272  256  262         Latest Ref Rng & Units 07/01/2022    9:21 AM 03/19/2022    8:14 AM 12/25/2021    8:14 AM  CMP  Glucose 70 - 99 mg/dL 95  99  91   BUN 8 - 23 mg/dL '13  8  12   ' Creatinine 0.44 - 1.00 mg/dL 0.79  0.77  0.82   Sodium 135 - 145 mmol/L 141  138  141   Potassium 3.5 - 5.1 mmol/L 4.7  4.1  4.0   Chloride 98 - 111 mmol/L 106  102  107   CO2 22 - 32 mmol/L '30  28  26   ' Calcium 8.9 - 10.3 mg/dL 9.9  9.6  9.5   Total Protein 6.5 - 8.1 g/dL 7.3  7.3  7.2   Total Bilirubin 0.3 - 1.2 mg/dL 0.5  0.6  0.5   Alkaline Phos 38 - 126 U/L 86  91  74   AST 15 - 41 U/L '15  16  19   ' ALT 0 - 44 U/L '11  14  16       ' RADIOGRAPHIC STUDIES: I have personally reviewed the radiological images as listed and agreed with the findings in the report. No results found.   ASSESSMENT & PLAN: Jean Morgan is a 61 y.o. female with    1. 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  -She started anastrozole before surgery in 02/2020. She has multiple mild side effects, including low libido, skin and eye dryness, hair thinning, hot flashes and joint stiffness, and fatigue.  She switched to exemestane in 05/2021, but she is still experiencing joint stiffness. She feels this is more tolerable than anastrozole. -Mammogram on 02/06/21 showed an abnormality in the right breast. Biopsy on 02/08/21 was benign.  Most recent mammogram 02/07/2022 again showed an abnormality in the right breast, path was benign -Jean Morgan is clinically doing well.  Tolerating exemestane with joint aches in her wrists and ankles, and fatigue.  Breast exam is benign, labs are normal.  Overall no clinical concern for breast cancer recurrence. -She is over 2 years from initial diagnosis, the recurrence risk has decreased some.  Continue surveillance and exemestane. -F/u in 6 months.    2.  B12 abnormality -She previously had low B12 and started supplementation, level became elevated and she has been off for over a year -B12 level remains >1000, today's level is pending -We reviewed possible causes; no evidence of liver or renal dysfunction.  No clinical suspicion for hematologic malignancy with normal CBC.  -Etiology remains  unclear, we will continue monitoring.  Hold B12 supplement  3. Osteopenia -She was recently diagnosed by 12/2019 DEXA, lowest T score -1.6 in LFN.   -Repeat DEXA 06/25/2022 shows slightly worsened osteopenia in the LFN, T score -1.9.  FRAX score is not high. -I reviewed that AI can weaken her bone density.  We discussed options including continue exemestane and consider medication for bone density such as Fosamax, Prolia, Xgeva, or Zometa first switch to tamoxifen which has a bone strengthening quality. -The pros and cons were  discussed, she prefers to continue exemestane and hold off on other medication for now.  She will maximize calcium, vitamin D, and weightbearing exercise  4. Antiestrogen side effects Management -She has multiple mild side effects from anastrozole: low libido, skin and eye dryness, hair thinning, hot flashes and joint stiffness.  -She also has chronic fatigue but notes she has always had that -she switched to exemestane on 06/24/21. She reports continued joint stiffness, but knee pain has resolved.  Manageable in her ankles and wrists.   -She notes she gains weight when she switches antiestrogen drugs, prefers not to switch for now   5. Covid-19+ 02/19/21 -experienced cough, nasal congestion, post nasal drip, dry throat. -vaccinated x3, most recently 01/04/21 -she has recovered well, taste has returned. -Questions if her chronic fatigue is related to previous COVID   6. 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 She was recently found to have elevated B12 and told to stop her supplement. She notes increased fatigue without it. I recommend she take every other day. 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 S/p removal.  -Continue Celexa, mood stable, did not get worse after she started anastrozole.   7. Genetic Testing negative for pathogenetic mutations  Plan: -April 2023 mammogram and biopsy, recent DEXA, and today's labs discussed -Continue breast cancer surveillance and exemestane, refilled -Maximize calcium, vitamin D, and weightbearing exercise for osteopenia -Discussed B12, level is pending from today. Not currently on a supplement -F/up 6 months, or sooner if needed    Orders Placed This Encounter  Procedures   MM DIAG BREAST TOMO BILATERAL    Standing Status:   Future    Standing Expiration Date:   07/02/2023    Order Specific Question:   Reason for Exam (SYMPTOM  OR  DIAGNOSIS REQUIRED)    Answer:   h/o R breast cancer 2021, abnomral mammograms with biopsies in 2022 and 2023 (both benign)    Order Specific Question:   Preferred imaging location?    Answer:   [facility]   Vitamin B12    Standing Status:   Future    Number of Occurrences:   1    Standing Expiration Date:   07/02/2023    Order Specific Question:   Release to patient    Answer:   Immediate   All questions were answered. The patient knows to call the clinic with any problems, questions or concerns. No barriers to learning was detected. I spent 20 minutes counseling the patient face to face. The total time spent in the appointment was 30 minutes and more than 50% was on counseling and review of test results     Alla Feeling, NP 07/01/22

## 2022-07-01 ENCOUNTER — Inpatient Hospital Stay (HOSPITAL_BASED_OUTPATIENT_CLINIC_OR_DEPARTMENT_OTHER): Payer: BC Managed Care – PPO | Admitting: Nurse Practitioner

## 2022-07-01 ENCOUNTER — Encounter: Payer: Self-pay | Admitting: Nurse Practitioner

## 2022-07-01 ENCOUNTER — Other Ambulatory Visit: Payer: Self-pay

## 2022-07-01 ENCOUNTER — Inpatient Hospital Stay: Payer: BC Managed Care – PPO | Attending: Nurse Practitioner

## 2022-07-01 VITALS — BP 141/72 | HR 65 | Temp 98.0°F | Resp 18 | Wt 158.9 lb

## 2022-07-01 DIAGNOSIS — Z885 Allergy status to narcotic agent status: Secondary | ICD-10-CM | POA: Insufficient documentation

## 2022-07-01 DIAGNOSIS — R5382 Chronic fatigue, unspecified: Secondary | ICD-10-CM | POA: Diagnosis not present

## 2022-07-01 DIAGNOSIS — C50511 Malignant neoplasm of lower-outer quadrant of right female breast: Secondary | ICD-10-CM | POA: Insufficient documentation

## 2022-07-01 DIAGNOSIS — Z79811 Long term (current) use of aromatase inhibitors: Secondary | ICD-10-CM | POA: Insufficient documentation

## 2022-07-01 DIAGNOSIS — Z808 Family history of malignant neoplasm of other organs or systems: Secondary | ICD-10-CM | POA: Diagnosis not present

## 2022-07-01 DIAGNOSIS — Z800 Family history of malignant neoplasm of digestive organs: Secondary | ICD-10-CM | POA: Insufficient documentation

## 2022-07-01 DIAGNOSIS — M85852 Other specified disorders of bone density and structure, left thigh: Secondary | ICD-10-CM | POA: Diagnosis not present

## 2022-07-01 DIAGNOSIS — Z170 Estrogen receptor positive status [ER+]: Secondary | ICD-10-CM

## 2022-07-01 DIAGNOSIS — Z923 Personal history of irradiation: Secondary | ICD-10-CM | POA: Insufficient documentation

## 2022-07-01 DIAGNOSIS — E538 Deficiency of other specified B group vitamins: Secondary | ICD-10-CM | POA: Diagnosis not present

## 2022-07-01 DIAGNOSIS — Z85820 Personal history of malignant melanoma of skin: Secondary | ICD-10-CM | POA: Insufficient documentation

## 2022-07-01 DIAGNOSIS — Z8041 Family history of malignant neoplasm of ovary: Secondary | ICD-10-CM | POA: Diagnosis not present

## 2022-07-01 DIAGNOSIS — F419 Anxiety disorder, unspecified: Secondary | ICD-10-CM | POA: Diagnosis not present

## 2022-07-01 DIAGNOSIS — M25569 Pain in unspecified knee: Secondary | ICD-10-CM | POA: Diagnosis not present

## 2022-07-01 DIAGNOSIS — Z806 Family history of leukemia: Secondary | ICD-10-CM | POA: Diagnosis not present

## 2022-07-01 DIAGNOSIS — Z79899 Other long term (current) drug therapy: Secondary | ICD-10-CM | POA: Diagnosis not present

## 2022-07-01 DIAGNOSIS — Z8042 Family history of malignant neoplasm of prostate: Secondary | ICD-10-CM | POA: Diagnosis not present

## 2022-07-01 DIAGNOSIS — Z8616 Personal history of COVID-19: Secondary | ICD-10-CM | POA: Insufficient documentation

## 2022-07-01 DIAGNOSIS — Z803 Family history of malignant neoplasm of breast: Secondary | ICD-10-CM | POA: Insufficient documentation

## 2022-07-01 LAB — CBC WITH DIFFERENTIAL (CANCER CENTER ONLY)
Abs Immature Granulocytes: 0.02 10*3/uL (ref 0.00–0.07)
Basophils Absolute: 0 10*3/uL (ref 0.0–0.1)
Basophils Relative: 0 %
Eosinophils Absolute: 0.2 10*3/uL (ref 0.0–0.5)
Eosinophils Relative: 3 %
HCT: 40.7 % (ref 36.0–46.0)
Hemoglobin: 13.7 g/dL (ref 12.0–15.0)
Immature Granulocytes: 0 %
Lymphocytes Relative: 32 %
Lymphs Abs: 2.7 10*3/uL (ref 0.7–4.0)
MCH: 31.1 pg (ref 26.0–34.0)
MCHC: 33.7 g/dL (ref 30.0–36.0)
MCV: 92.5 fL (ref 80.0–100.0)
Monocytes Absolute: 0.5 10*3/uL (ref 0.1–1.0)
Monocytes Relative: 6 %
Neutro Abs: 5 10*3/uL (ref 1.7–7.7)
Neutrophils Relative %: 59 %
Platelet Count: 272 10*3/uL (ref 150–400)
RBC: 4.4 MIL/uL (ref 3.87–5.11)
RDW: 12.3 % (ref 11.5–15.5)
WBC Count: 8.5 10*3/uL (ref 4.0–10.5)
nRBC: 0 % (ref 0.0–0.2)

## 2022-07-01 LAB — CMP (CANCER CENTER ONLY)
ALT: 11 U/L (ref 0–44)
AST: 15 U/L (ref 15–41)
Albumin: 4.6 g/dL (ref 3.5–5.0)
Alkaline Phosphatase: 86 U/L (ref 38–126)
Anion gap: 5 (ref 5–15)
BUN: 13 mg/dL (ref 8–23)
CO2: 30 mmol/L (ref 22–32)
Calcium: 9.9 mg/dL (ref 8.9–10.3)
Chloride: 106 mmol/L (ref 98–111)
Creatinine: 0.79 mg/dL (ref 0.44–1.00)
GFR, Estimated: >60 mL/min (ref >60)
Glucose, Bld: 95 mg/dL (ref 70–99)
Potassium: 4.7 mmol/L (ref 3.5–5.1)
Sodium: 141 mmol/L (ref 135–145)
Total Bilirubin: 0.5 mg/dL (ref 0.3–1.2)
Total Protein: 7.3 g/dL (ref 6.5–8.1)

## 2022-07-01 LAB — VITAMIN B12: Vitamin B-12: 1441 pg/mL — ABNORMAL HIGH (ref 180–914)

## 2022-07-01 MED ORDER — EXEMESTANE 25 MG PO TABS
ORAL_TABLET | ORAL | 3 refills | Status: DC
Start: 2022-07-01 — End: 2023-06-05

## 2022-09-23 NOTE — Progress Notes (Signed)
HPI: [name] is a 61 y.o. female, who is here today for chronic disease management.  Last seen on 03/19/2022. She reports that her mood has improved recently, feeling less tired and low. Her daughter is overseas, [place],and cannot visit, she is her mother's caregiver. Her Dx'ed of breast cancer exacerbated symptoms but she has learned to accept it, she is tolerated new medication better. She feels like she is dealing well with stressors.  She has not filled her Valium prescription for anxiety in a long time and requests a new prescription  HLD and PAD: Lab Results  Component Value Date   CHOL 247 (H) 03/19/2022   HDL 53.40 03/19/2022   LDLCALC 177 (H) 03/19/2022   TRIG 84.0 03/19/2022   CHOLHDL 5 03/19/2022   Right: Resting right ankle-brachial index is within normal range. No evidence of significant right lower extremity arterial disease. The right toe-brachial index is abnormal. RT great toe pressure = 95 mmHg.   Left: Resting left ankle-brachial index is within normal range. No evidence of significant left lower extremity arterial disease. The left toe-brachial index is normal. LT Great toe pressure = 133 mmHg.   Genital herpes, she takes Valtrex as needed.She needs refills.  Review of Systems  Constitutional:  Positive for fatigue. Negative for activity change, appetite change and fever.  HENT:  Negative for mouth sores, nosebleeds and trouble swallowing.   Respiratory:  Negative for cough, shortness of breath and wheezing.   Cardiovascular:  Negative for chest pain, palpitations and leg swelling.  Gastrointestinal:  Negative for abdominal pain, nausea and vomiting.       Negative for changes in bowel habits.  Genitourinary:  Negative for dysuria and hematuria.  Neurological:  Negative for seizures, syncope, weakness, numbness and headaches.  See other pertinent positives and negatives in HPI.  Current Outpatient Medications on File Prior to Visit  Medication Sig  Dispense Refill   Calcium-Vitamin D-Vitamin K 625-638-93 MG-UNT-MCG CHEW Chew 2 each by mouth daily.     cetirizine (ZYRTEC) 5 MG tablet Take 5 mg by mouth daily as needed for allergies.     citalopram (CELEXA) 20 MG tablet TAKE 1 TABLET BY MOUTH EVERY DAY 90 tablet 2   exemestane (AROMASIN) 25 MG tablet TAKE 1 TABLET BY MOUTH DAILY AFTER BREAKFAST. 90 tablet 3   GARLIC-LECITHIN PO Take 2 tablets by mouth daily.     QUERCETIN PO Take 2 tablets by mouth daily.     TURMERIC PO Take 2 tablets by mouth daily.     No current facility-administered medications on file prior to visit.    Past Medical History:  Diagnosis Date   Anxiety    Cancer ([place])    skin   Depression    Family history of adverse reaction to anesthesia    brother had severe agitation coming out of anesthesia, had to be re-intubated   Family history of breast cancer    Family history of colon cancer    Family history of leukemia    Family history of ovarian cancer    Family history of prostate cancer    Family history of skin cancer    Heart murmur    "slight"   Hypothyroidism    Optic neuritis    Personal history of radiation therapy    [facility] spotted fever    1970's   Allergies  Allergen Reactions   Latex Itching   Oxycodone Other (See Comments)    Other Reaction: "too strong a  drug for me"    Social History   Socioeconomic History   Marital status: Married    Spouse name: Not on file   Number of children: 1   Years of education: Not on file   Highest education level: Not on file  Occupational History   Not on file  Tobacco Use   Smoking status: Never   Smokeless tobacco: Never  Vaping Use   Vaping Use: Never used  Substance and Sexual Activity   Alcohol use: Yes    Alcohol/week: 7.0 standard drinks of alcohol    Types: 7 Glasses of wine per week   Drug use: No   Sexual activity: Not on file  Other Topics Concern   Not on file  Social History Narrative   Not on file   Social  Determinants of Health   Financial Resource Strain: Not on file  Food Insecurity: Not on file  Transportation Needs: Not on file  Physical Activity: Not on file  Stress: Not on file  Social Connections: Not on file    Vitals:   09/24/22 0750  BP: 118/70  Pulse: 79  Resp: 16  Temp: 98.2 F (36.8 C)  SpO2: 97%   Body mass index is 25.82 kg/m.  Physical Exam Vitals and nursing note reviewed.  Constitutional:      General: She is not in acute distress.    Appearance: She is well-developed.  HENT:     Head: Normocephalic and atraumatic.     Right Ear: Tympanic membrane, ear canal and external ear normal.     Left Ear: Tympanic membrane and external ear normal. Tympanic membrane is not erythematous.     Ears:     Comments: Cerumen in left ear canal, TM seen partially.    Mouth/Throat:     Mouth: Mucous membranes are moist.     Pharynx: Oropharynx is clear.  Eyes:     Conjunctiva/sclera: Conjunctivae normal.  Cardiovascular:     Rate and Rhythm: Normal rate and regular rhythm.     Pulses:          Dorsalis pedis pulses are 2+ on the right side and 2+ on the left side.       Posterior tibial pulses are 2+ on the right side and 2+ on the left side.     Heart sounds: No murmur heard. Pulmonary:     Effort: Pulmonary effort is normal. No respiratory distress.     Breath sounds: Normal breath sounds.  Abdominal:     Palpations: Abdomen is soft. There is no hepatomegaly or mass.     Tenderness: There is no abdominal tenderness.  Lymphadenopathy:     Cervical: No cervical adenopathy.  Skin:    General: Skin is warm.     Findings: No erythema or rash.  Neurological:     General: No focal deficit present.     Mental Status: She is alert and oriented to person, place, and time.     Cranial Nerves: No cranial nerve deficit.     Gait: Gait normal.  Psychiatric:        Mood and Affect: Affect normal. Mood is anxious.   ASSESSMENT AND PLAN:  Jean Morgan was seen today for  follow-up.  Diagnoses and all orders for this visit: Lab Results  Component Value Date   CHOL 263 (H) 09/24/2022   HDL 54.70 09/24/2022   LDLCALC 187 (H) 09/24/2022   TRIG 106.0 09/24/2022   CHOLHDL 5 09/24/2022   Hyperlipidemia, unspecified  hyperlipidemia type Assessment & Plan: She is not on statin, has been recommended but she has not been interested in starting med. Continue low fat diet. FLP ordered today.  Orders: -     Lipid panel; Future  PAD (peripheral artery disease) ([place]) Assessment & Plan: She is not on antiplatelet therapy or statin. We discussed CV benefits of statin medications. Recommend starting Aspirin 81 mg daily. She doe snot want to repeat ABI at this time, will let me know if she wants it next year. Will make recommendations in regard to stain dose according to FLP results.  Orders: -     Lipid panel; Future  Anxiety disorder, unspecified type Assessment & Plan: Recently exacerbated along with mild depressed mood due to holidays and past memories. She is feeling better. Continue Celexa same dose. Valium 5 mg Rx printed to continue daily as needed.  Orders: -     diazePAM; Take 0.5-1 tablets (2.5-5 mg total) by mouth daily as needed for anxiety.  Dispense: 30 tablet; Refill: 1  Recurrent genital herpes Assessment & Plan: Problem otherwise stable. Valtrex Rx sent to continue 1 g daily x 5 d to start upon symptoms onset.  Orders: -     valACYclovir HCl; TAKE 1 TABLET ONCE DAILY FOR 5 DAYS AS NEEDED, START ASAP WITHIN 48 HOURS OF ONSET OF SYMPTOMS  Dispense: 30 tablet; Refill: 1   Return in about 6 months (around 03/25/2023) for CPE.  Masaki Rothbauer G. [name], MD  [facility]. [place] office.

## 2022-09-24 ENCOUNTER — Ambulatory Visit (INDEPENDENT_AMBULATORY_CARE_PROVIDER_SITE_OTHER): Payer: BC Managed Care – PPO | Admitting: Family Medicine

## 2022-09-24 ENCOUNTER — Encounter: Payer: Self-pay | Admitting: Family Medicine

## 2022-09-24 VITALS — BP 118/70 | HR 79 | Temp 98.2°F | Resp 16 | Ht 66.0 in | Wt 160.0 lb

## 2022-09-24 DIAGNOSIS — E785 Hyperlipidemia, unspecified: Secondary | ICD-10-CM

## 2022-09-24 DIAGNOSIS — I739 Peripheral vascular disease, unspecified: Secondary | ICD-10-CM

## 2022-09-24 DIAGNOSIS — F419 Anxiety disorder, unspecified: Secondary | ICD-10-CM

## 2022-09-24 DIAGNOSIS — A6000 Herpesviral infection of urogenital system, unspecified: Secondary | ICD-10-CM

## 2022-09-24 LAB — LIPID PANEL
Cholesterol: 263 mg/dL — ABNORMAL HIGH (ref 0–200)
HDL: 54.7 mg/dL (ref >39.00)
LDL Cholesterol: 187 mg/dL — ABNORMAL HIGH (ref 0–99)
NonHDL: 208.41
Total CHOL/HDL Ratio: 5
Triglycerides: 106 mg/dL (ref 0.0–149.0)
VLDL: 21.2 mg/dL (ref 0.0–40.0)

## 2022-09-24 MED ORDER — DIAZEPAM 5 MG PO TABS: 2.5000 mg | ORAL_TABLET | Freq: Every day | ORAL | 1 refills | Status: DC | PRN

## 2022-09-24 MED ORDER — VALACYCLOVIR HCL 1 G PO TABS: ORAL_TABLET | ORAL | 1 refills | Status: DC

## 2022-09-24 NOTE — Assessment & Plan Note (Signed)
Recently exacerbated along with mild depressed mood due to holidays and past memories. She is feeling better. Continue Celexa same dose. Valium 5 mg Rx printed to continue daily as needed.

## 2022-09-24 NOTE — Assessment & Plan Note (Signed)
She is not on antiplatelet therapy or statin. We discussed CV benefits of statin medications. Recommend starting Aspirin 81 mg daily. She doe snot want to repeat ABI at this time, will let me know if she wants it next year. Will make recommendations in regard to stain dose according to FLP results.

## 2022-09-24 NOTE — Patient Instructions (Addendum)
A few things to remember from today's visit:  Hyperlipidemia, unspecified hyperlipidemia type - Plan: Lipid panel  PAD (peripheral artery disease) ([place]) - Plan: Lipid panel  Anxiety disorder, unspecified type - Plan: diazepam (VALIUM) 5 MG tablet  Recurrent genital herpes - Plan: valACYclovir (VALTREX) 1000 MG tablet  If you need refills for medications you take chronically, please call your pharmacy. Do not use My Chart to request refills or for acute issues that need immediate attention. If you send a my chart message, it may take a few days to be addressed, specially if I am not in the office.  Please be sure medication list is accurate. If a new problem present, please set up appointment sooner than planned today.

## 2022-09-24 NOTE — Assessment & Plan Note (Signed)
She is not on statin, has been recommended but she has not been interested in starting med. Continue low fat diet. FLP ordered today.

## 2022-09-27 NOTE — Assessment & Plan Note (Signed)
Problem otherwise stable. Valtrex Rx sent to continue 1 g daily x 5 d to start upon symptoms onset.

## 2022-10-31 ENCOUNTER — Encounter: Payer: Self-pay | Admitting: Family Medicine

## 2022-10-31 ENCOUNTER — Telehealth (INDEPENDENT_AMBULATORY_CARE_PROVIDER_SITE_OTHER): Payer: BC Managed Care – PPO | Admitting: Family Medicine

## 2022-10-31 VITALS — Temp 98.3°F | Ht 66.0 in | Wt 160.0 lb

## 2022-10-31 DIAGNOSIS — U071 COVID-19: Secondary | ICD-10-CM

## 2022-10-31 MED ORDER — BENZONATATE 200 MG PO CAPS: 200.0000 mg | ORAL_CAPSULE | Freq: Two times a day (BID) | ORAL | 0 refills | Status: DC | PRN

## 2022-10-31 MED ORDER — DM-GUAIFENESIN ER 30-600 MG PO TB12: 1.0000 | ORAL_TABLET | Freq: Two times a day (BID) | ORAL | 0 refills | Status: DC

## 2022-10-31 NOTE — Progress Notes (Signed)
Established Patient Office Visit   Subjective:  Patient ID: Jean Morgan, female    DOB: Sep 27, 1961  Age: 62 y.o. MRN: [id number]  Chief Complaint  Patient presents with   Covid Positive    Covid positive , she tested  at home yesterday, she had a fever   bodyaches and coughing, sore throat , nasal congestion . Started having having symptoms on Sunday . Taking cough medication and throat spray and Tylenol     HPI Encounter Diagnoses  Name Primary?   COVID-19 virus infection Yes   5-day history of URI signs and symptoms that have been slowly improving.  Status post fever chills postnasal drip and sore throat.  Status post elevated temperature that is broken today.  Denies wheezing or difficulty breathing.  No asthma history.  No history of tobacco use.  Otherwise healthy.   Review of Systems  Constitutional: Negative.   HENT:  Positive for congestion. Negative for sore throat.   Eyes:  Negative for blurred vision, discharge and redness.  Respiratory:  Positive for cough. Negative for shortness of breath and wheezing.   Cardiovascular: Negative.   Gastrointestinal:  Negative for abdominal pain.  Genitourinary: Negative.   Musculoskeletal:  Positive for joint pain and myalgias.  Skin:  Negative for rash.  Neurological:  Negative for tingling, loss of consciousness and weakness.  Endo/Heme/Allergies:  Negative for polydipsia.     Current Outpatient Medications:    benzonatate (TESSALON) 200 MG capsule, Take 1 capsule (200 mg total) by mouth 2 (two) times daily as needed for cough., Disp: 20 capsule, Rfl: 0   Calcium-Vitamin D-Vitamin K [id number] MG-UNT-MCG CHEW, Chew 2 each by mouth daily., Disp: , Rfl:    cetirizine (ZYRTEC) 5 MG tablet, Take 5 mg by mouth daily as needed for allergies., Disp: , Rfl:    citalopram (CELEXA) 20 MG tablet, TAKE 1 TABLET BY MOUTH EVERY DAY, Disp: 90 tablet, Rfl: 2   dextromethorphan-guaiFENesin (MUCINEX DM) 30-600 MG 12hr tablet, Take 1 tablet  by mouth 2 (two) times daily., Disp: 20 tablet, Rfl: 0   diazepam (VALIUM) 5 MG tablet, Take 0.5-1 tablets (2.5-5 mg total) by mouth daily as needed for anxiety., Disp: 30 tablet, Rfl: 1   exemestane (AROMASIN) 25 MG tablet, TAKE 1 TABLET BY MOUTH DAILY AFTER BREAKFAST., Disp: 90 tablet, Rfl: 3   GARLIC-LECITHIN PO, Take 2 tablets by mouth daily., Disp: , Rfl:    QUERCETIN PO, Take 2 tablets by mouth daily., Disp: , Rfl:    TURMERIC PO, Take 2 tablets by mouth daily., Disp: , Rfl:    valACYclovir (VALTREX) 1000 MG tablet, TAKE 1 TABLET ONCE DAILY FOR 5 DAYS AS NEEDED, START ASAP WITHIN 48 HOURS OF ONSET OF SYMPTOMS, Disp: 30 tablet, Rfl: 1   Objective:     Temp 98.3 F (36.8 C)   Ht '5\' 6"'$  (1.676 m)   Wt 160 lb (72.6 kg)   BMI 25.82 kg/m    Physical Exam Constitutional:      General: She is not in acute distress.    Appearance: Normal appearance. She is not ill-appearing, toxic-appearing or diaphoretic.  HENT:     Head: Normocephalic and atraumatic.     Right Ear: External ear normal.     Left Ear: External ear normal.  Eyes:     General: No scleral icterus.       Right eye: No discharge.        Left eye: No discharge.     Extraocular  Movements: Extraocular movements intact.     Conjunctiva/sclera: Conjunctivae normal.  Pulmonary:     Effort: Pulmonary effort is normal. No respiratory distress.  Skin:    General: Skin is warm and dry.  Neurological:     Mental Status: She is alert and oriented to person, place, and time.  Psychiatric:        Mood and Affect: Mood normal.        Behavior: Behavior normal.      No results found for any visits on 10/31/22.    The ASCVD Risk score (Arnett DK, et al., 2019) failed to calculate for the following reasons:   The systolic blood pressure is missing    Assessment & Plan:   COVID-19 virus infection -     Benzonatate; Take 1 capsule (200 mg total) by mouth 2 (two) times daily as needed for cough.  Dispense: 20 capsule;  Refill: 0 -     DM-guaiFENesin ER; Take 1 tablet by mouth 2 (two) times daily.  Dispense: 20 tablet; Refill: 0    Return in about 1 week (around 11/07/2022), or if symptoms worsen or fail to improve.  Continue to quarantine through tomorrow.  Above medications as needed.  Follow-up next week if not continuing to improve.  Libby Maw, MD Virtual Visit via Video Note  I connected with Jean Morgan on 10/31/22 at  9:20 AM EST by a video enabled telemedicine application and verified that I am speaking with the correct person using two identifiers.  Location: Patient: home with husband.  Provider: work   I discussed the limitations of evaluation and management by telemedicine and the availability of in person appointments. The patient expressed understanding and agreed to proceed.  History of Present Illness:    Observations/Objective:   Assessment and Plan:   Follow Up Instructions:    I discussed the assessment and treatment plan with the patient. The patient was provided an opportunity to ask questions and all were answered. The patient agreed with the plan and demonstrated an understanding of the instructions.   The patient was advised to call back or seek an in-person evaluation if the symptoms worsen or if the condition fails to improve as anticipated.  I provided 20 minutes of non-face-to-face time during this encounter.   Libby Maw, MD

## 2022-11-07 ENCOUNTER — Encounter: Payer: Self-pay | Admitting: Family Medicine

## 2022-11-07 ENCOUNTER — Ambulatory Visit (INDEPENDENT_AMBULATORY_CARE_PROVIDER_SITE_OTHER): Payer: BC Managed Care – PPO | Admitting: Family Medicine

## 2022-11-07 VITALS — BP 118/66 | HR 75 | Temp 98.5°F | Ht 66.0 in | Wt 155.2 lb

## 2022-11-07 DIAGNOSIS — R059 Cough, unspecified: Secondary | ICD-10-CM

## 2022-11-07 DIAGNOSIS — U071 COVID-19: Secondary | ICD-10-CM

## 2022-11-07 NOTE — Progress Notes (Signed)
Established Patient Office Visit  Subjective   Patient ID: Jean Morgan, female    DOB: 10-31-1960  Age: 62 y.o. MRN: [id number]  Chief Complaint  Patient presents with   Follow-up   Fatigue        Nasal Congestion   Cough    Patient complains of cough, Productive with yellow sputum, Tried Nyquil, Zyrtec,    Ear Fullness    HPI   Ramla is seen following recent COVID infection.  She was diagnosed New Year's Eve.  She then did a virtual visit around 5 January.  Because of time course no antivirals were prescribed.  She is overall better but has some residual fatigue and bilateral ear fullness.  She has cough productive of yellow sputum.  No fever.  No chills.  No dyspnea.  Has returned back to work.  Denies any nausea, vomiting, or diarrhea.  Past Medical History:  Diagnosis Date   Anxiety    Cancer ([place])    skin   Depression    Family history of adverse reaction to anesthesia    brother had severe agitation coming out of anesthesia, had to be re-intubated   Family history of breast cancer    Family history of colon cancer    Family history of leukemia    Family history of ovarian cancer    Family history of prostate cancer    Family history of skin cancer    Heart murmur    "slight"   Hypothyroidism    Optic neuritis    Personal history of radiation therapy    [place] spotted fever    1970's   Past Surgical History:  Procedure Laterality Date   BREAST BIOPSY Right 02/01/2020   BREAST LUMPECTOMY Right 04/19/2020   BREAST LUMPECTOMY WITH RADIOACTIVE SEED AND SENTINEL LYMPH NODE BIOPSY Right 04/19/2020   Procedure: RIGHT BREAST LUMPECTOMY WITH RADIOACTIVE SEED AND SENTINEL LYMPH NODE BIOPSY;  Surgeon: [name], MD;  Location: [facility];  Service: General;  Laterality: Right;  [place]  2006   Right forearm   SENTINEL NODE BIOPSY Right 2006   TONSILLECTOMY     TONSILLECTOMY      reports that she has never smoked.  She has never used smokeless tobacco. She reports current alcohol use of about 7.0 standard drinks of alcohol per week. She reports that she does not use drugs. family history includes Arthritis in her mother; Breast cancer in her maternal grandmother; Breast cancer (age of onset: 22) in an other family member; Colitis in her mother; Colon cancer in her paternal aunt; Colon cancer (age of onset: 19) in an other family member; Colon polyps in her mother; Diabetes in her maternal grandmother and mother; Heart disease in her father; Hyperlipidemia in her father and mother; Hypertension in her father; Leukemia (age of onset: 5) in her maternal great-grandmother; Ovarian cancer (age of onset: 75) in an other family member; Prostate cancer (age of onset: 21) in her half-brother; Skin cancer in her mother; Skin cancer (age of onset: 54) in an other family member. Allergies  Allergen Reactions   Latex Itching   Oxycodone Other (See Comments)    Other Reaction: "too strong a drug for me"    Review of Systems  Constitutional:  Negative for chills and fever.  HENT:  Negative for ear discharge, ear pain and hearing loss.   Respiratory:  Positive for cough.   Cardiovascular:  Negative for chest pain.      Objective:     BP 118/66 (BP Location: Left Arm, Patient Position: Sitting, Cuff Size: Normal)   Pulse 75   Temp 98.5 F (36.9 C) (Oral)   Ht '5\' 6"'$  (1.676 m)   Wt 155 lb 3.2 oz (70.4 kg)   SpO2 99%   BMI 25.05 kg/m    Physical Exam Vitals reviewed.  Constitutional:      General: She is not in acute distress.    Appearance: Normal appearance. She is not ill-appearing.  HENT:     Head: Normocephalic and atraumatic.     Right Ear: Tympanic membrane normal.     Left Ear: Tympanic membrane normal.  Cardiovascular:     Rate and Rhythm: Normal rate and regular rhythm.  Pulmonary:     Effort: Pulmonary effort is normal. No respiratory distress.     Breath sounds: Normal breath sounds. No  wheezing or rales.  Musculoskeletal:     Cervical back: Neck supple.  Lymphadenopathy:     Cervical: No cervical adenopathy.  Neurological:     Mental Status: She is alert.      No results found for any visits on 11/07/22.    The 10-year ASCVD risk score (Arnett DK, et al., 2019) is: 4.2%    Assessment & Plan:   Recent COVID infection.  She had some residual cough and fatigue but overall improved.  Nonfocal exam.  Does not any red flags such as fever or dyspnea.  Pulse oximetry 99%.  -Continue current course.  Continue over-the-counter cough medications if needed at night for cough. -Follow-up immediately for any fever, increased shortness of breath, or other concerns.  Carolann Littler, MD

## 2022-11-27 ENCOUNTER — Other Ambulatory Visit: Payer: Self-pay | Admitting: Family Medicine

## 2022-11-27 DIAGNOSIS — A6000 Herpesviral infection of urogenital system, unspecified: Secondary | ICD-10-CM

## 2022-11-30 ENCOUNTER — Other Ambulatory Visit: Payer: Self-pay | Admitting: Family Medicine

## 2022-11-30 DIAGNOSIS — F419 Anxiety disorder, unspecified: Secondary | ICD-10-CM

## 2022-12-08 ENCOUNTER — Ambulatory Visit: Payer: BC Managed Care – PPO | Attending: General Surgery

## 2022-12-08 VITALS — Wt 156.5 lb

## 2022-12-08 DIAGNOSIS — Z483 Aftercare following surgery for neoplasm: Secondary | ICD-10-CM | POA: Insufficient documentation

## 2022-12-08 NOTE — Therapy (Signed)
OUTPATIENT PHYSICAL THERAPY SOZO SCREENING NOTE   Patient Name: Jean Morgan MRN: [id number] DOB:03/18/1961, 62 y.o., female Today's Date: 12/08/2022  PCP: [name], [name], MD REFERRING PROVIDER: [name], MD   PT End of Session - 12/08/22 1626     Visit Number 25   # unchanged due to screen only   PT Start Time 1624    PT Stop Time 1629    PT Time Calculation (min) 5 min    Activity Tolerance Patient tolerated treatment well    Behavior During Therapy WFL for tasks assessed/performed             Past Medical History:  Diagnosis Date   Anxiety    Cancer ([place])    skin   Depression    Family history of adverse reaction to anesthesia    brother had severe agitation coming out of anesthesia, had to be re-intubated   Family history of breast cancer    Family history of colon cancer    Family history of leukemia    Family history of ovarian cancer    Family history of prostate cancer    Family history of skin cancer    Heart murmur    "slight"   Hypothyroidism    Optic neuritis    Personal history of radiation therapy    [place] spotted fever    1970's   Past Surgical History:  Procedure Laterality Date   BREAST BIOPSY Right 02/01/2020   BREAST LUMPECTOMY Right 04/19/2020   BREAST LUMPECTOMY WITH RADIOACTIVE SEED AND SENTINEL LYMPH NODE BIOPSY Right 04/19/2020   Procedure: RIGHT BREAST LUMPECTOMY WITH RADIOACTIVE SEED AND SENTINEL LYMPH NODE BIOPSY;  Surgeon: [name], MD;  Location: [facility];  Service: General;  Laterality: Right;  [place]  2006   Right forearm   SENTINEL NODE BIOPSY Right 2006   TONSILLECTOMY     TONSILLECTOMY     Patient Active Problem List   Diagnosis Date Noted   PAD (peripheral artery disease) ([place]) 07/09/2020   Carpal tunnel syndrome 03/09/2020   Genetic testing 02/29/2020   Family history of breast cancer    Family history of ovarian cancer    Family history of colon cancer     Family history of prostate cancer    Family history of skin cancer    Family history of leukemia    Malignant neoplasm of lower-outer quadrant of right breast of female, estrogen receptor positive ([place]) 02/02/2020   Osteopenia 01/25/2020   B12 deficiency 01/04/2020   Hearing loss 08/18/2018   Hyperlipidemia 11/28/2016   Anxiety disorder 11/28/2016   Chronic fatigue 11/28/2016   Recurrent genital herpes 11/28/2016   Melanoma of skin, site unspecified 06/26/2013    REFERRING DIAG: right breast cancer at risk for lymphedema  THERAPY DIAG: Aftercare following surgery for neoplasm  PERTINENT HISTORY: Diagnosis of 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 Radiation completed   has started anastrozole  PRECAUTIONS: right UE Lymphedema risk, None  SUBJECTIVE: Pt returns for her 3 month L-Dex screen.   PAIN:  Are you having pain? No  SOZO SCREENING: Patient was assessed today using the SOZO machine to determine the lymphedema index score. This was compared to her baseline score. It was determined that she is within  the recommended range when compared to her baseline and no further action is needed at this time. She will continue SOZO screenings. These are done every 3 months for 2 years post operatively followed by every 6 months for 2 years, and then annually. Reprinted self MLD breast handout for pt per her request.    L-DEX FLOWSHEETS - 12/08/22 1600       L-DEX LYMPHEDEMA SCREENING   Measurement Type Unilateral    L-DEX MEASUREMENT EXTREMITY Upper Extremity    POSITION  Standing    DOMINANT SIDE Right    At Risk Side Right    BASELINE SCORE (UNILATERAL) -1.8    L-DEX SCORE (UNILATERAL) -4.6    VALUE CHANGE (UNILAT) -2.8              Otelia Limes, PTA 12/08/2022, 4:29 PM

## 2022-12-29 NOTE — Assessment & Plan Note (Signed)
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  -She underwent right breast lumpectomy with Dr Marlou Starks on 04/19/20 and adjuvant Radiation with Dr Lisbeth Renshaw.  -She started anastrozole before surgery in 02/2020. She has multiple mild side effects, including low libido, skin and eye dryness, hair thinning, hot flashes and joint stiffness, most of them are tolerable. We switched her to exemestane in 05/2021, but she is still experiencing joint stiffness. She feels this is more tolerable than anastrozole. -her most recent mammogram on 02/06/21 showed an abnormality in the right breast. Biopsy on 02/08/21 was benign.

## 2022-12-29 NOTE — Assessment & Plan Note (Signed)
-  She was recently diagnosed by 12/2019 DEXA. repeated DEXA in 03/2022 showed T score -1.9, overall slightly worse, no high risk for fracture  -Will monitor on AI which can weaken her bone.  -continue Calcium with Vit D. We discussed the benefit of zometa

## 2022-12-30 ENCOUNTER — Other Ambulatory Visit: Payer: Self-pay

## 2022-12-30 NOTE — Progress Notes (Unsigned)
Jean Morgan   [phone] [phone]   Clinic Follow up Note   Patient Care Team: [name], [name], MD as PCP - General (Family Medicine) Rolm Bookbinder, MD as Consulting Physician (Dermatology) [name], RN as Oncology Nurse Navigator [name], RN as Oncology Nurse Navigator [name], MD as Consulting Physician (General Surgery) Truitt Merle, MD as Consulting Physician (Hematology) [name], MD as Consulting Physician (Radiation Oncology) Alla Feeling, NP as Nurse Practitioner (Nurse Practitioner)  Date of Service:  12/31/2022  CHIEF COMPLAINT: f/u of right breast cancer    CURRENT THERAPY:  Antiestrogen therapy, started 02/2020             -currently exemestane since 05/2021   ASSESSMENT:  Jean Morgan is a 62 y.o. female with   Malignant neoplasm of lower-outer quadrant of right breast of female, estrogen receptor positive ([place]) Stage Ib, p(T2N0M0), ER/PR+, HER2-, Grade II, Oncotype RS 17  -She was diagnosed in April 2021.  -She underwent right breast lumpectomy with Dr Marlou Starks on 04/19/20 and adjuvant Radiation with Dr Lisbeth Renshaw.  -She started anastrozole before surgery in 02/2020. She has multiple mild side effects, including low libido, skin and eye dryness, hair thinning, hot flashes and joint stiffness, most of them are tolerable. We switched her to exemestane in 05/2021, she is overall tolerating well.  Lab reviewed, exam unremarkable today, there is no clinical concern for recurrence. -her most recent mammogram on 02/06/21 showed an abnormality in the right breast. Biopsy on 02/08/21 was benign.  Osteopenia -She was recently diagnosed by 12/2019 DEXA. repeated DEXA in 03/2022 showed T score -1.9, overall slightly worse, no high risk for fracture  -Will monitor on AI which can weaken her bone.  -continue Calcium with Vit D. We discussed the benefit of zometa, will hold on for now.    PLAN: - Discuss DEXA results - discuss  Zometa -lab reviewed -Continue Exemestane -lab,f/u in 6 months   SUMMARY OF ONCOLOGIC HISTORY: Oncology History Overview Note  Cancer Staging Malignant neoplasm of lower-outer quadrant of right breast of female, estrogen receptor positive ([place]) Staging form: Breast, AJCC 8th Edition - Clinical stage from 01/31/2020: Stage IB (cT2, cN0, cM0, G2, ER+, PR+, HER2-) - Signed by Truitt Merle, MD on 02/07/2020    Malignant neoplasm of lower-outer quadrant of right breast of female, estrogen receptor positive ([place])  01/23/2020 Imaging   DEXA 01/23/20  Osteopenia with lowest T-score -1.6 at left femur neck  Site Region Measured Date Measured Age YA BMD Significant CHANGE T-score DualFemur Neck Left  01/23/2020    60.1         -1.6    0.815 g/cm2   AP Spine  L1-L4 (L3) 01/23/2020    60.1         -1.1    1.035 g/cm2   DualFemur Total Mean 01/23/2020    60.1         -1.2    0.851 g/cm2   01/23/2020 Mammogram   Diangostic Mammogram 01/23/20  IMPRESSION: 1. There is a highly suspicious mass in the right breast at 6 o'clock 3 cm from the nipple demonstrates an irregular hypoechoic shadowing mass measuring 2.3 x 1.4 x 1.7 cm associated with pleomorphic calcifications.   2.  No evidence of right axillary lymphadenopathy.   3. There is a benign-appearing cyst in the superior left breast at 12:30, 3 cm from the nipple demonstrates an anechoic oval circumscribed mass measuring 0.9 x 0.8 x  0.9 cm. No evidence of left breast malignancy.    01/31/2020 Cancer Staging   Staging form: Breast, AJCC 8th Edition - Clinical stage from 01/31/2020: Stage IB (cT2, cN0, cM0, G2, ER+, PR+, HER2-) - Signed by Truitt Merle, MD on 02/07/2020   01/31/2020 Initial Biopsy   Diagnosis 01/31/20 Breast, right, needle core biopsy, 6 o'clock - INVASIVE MAMMARY CARCINOMA AND MAMMARY CARCINOMA IN SITU WITH PERINEURAL INVASION AND CALCIFICATIONS. Microscopic Comment The carcinoma is nuclear grade 2. The greatest linear extent of tumor in  any one core is 16 mm. Immunohistochemistry of E-cadherin will be reported separately. Ancillary studies will be reported separately.   01/31/2020 Receptors her2   PROGNOSTIC INDICATORS Results: IMMUNOHISTOCHEMICAL AND MORPHOMETRIC ANALYSIS PERFORMED MANUALLY The tumor cells are NEGATIVE for Her2 (1+). Estrogen Receptor: 100%, POSITIVE, STRONG STAINING INTENSITY Progesterone Receptor: 80%, POSITIVE, STRONG STAINING INTENSITY Proliferation Marker Ki67: 15%   01/31/2020 Oncotype testing   Low risk with recurrence score 17.  Distant risk of recurrence at 9 years 5% with AI or Tamoxifen. There is less than 1% benefit of chemotherapy.    02/02/2020 Initial Diagnosis   Malignant neoplasm of lower-outer quadrant of right breast of female, estrogen receptor positive ([place])   02/17/2020 Imaging   Breast MRI  IMPRESSION: Known malignancy in the LOWER central portion of the RIGHT breast measuring 3.1 centimeters.   No adenopathy in the RIGHT axilla.   No additional areas of concern are identified in either breast.     02/27/2020 -  Neo-Adjuvant Anti-estrogen oral therapy   Anastrozole '1mg'$  once daily starting in 02/27/20 with half tablet to see if tolerable.    02/28/2020 Genetic Testing   Negative genetic testing:  No pathogenic variants detected on the [facility] Multi-Cancer Panel. Two variants of uncertain significance were detected - one in the HOXB13 gene called c.215G>T and one in the RNF43 gene called c.2139A>T. The report date is 02/28/2020.  The Multi-Cancer Panel offered by [facility] includes sequencing and/or deletion duplication testing of the following 85 genes: AIP, ALK, APC, ATM, AXIN2,BAP1,  BARD1, BLM, BMPR1A, BRCA1, BRCA2, BRIP1, CASR, CDC73, CDH1, CDK4, CDKN1B, CDKN1C, CDKN2A (p14ARF), CDKN2A (p16INK4a), CEBPA, CHEK2, CTNNA1, DICER1, DIS3L2, EGFR (c.2369C>T, p.Thr790Met variant only), EPCAM (Deletion/duplication testing only), FH, FLCN, GATA2, GPC3, GREM1 (Promoter region deletion/duplication  testing only), HOXB13 (c.251G>A, p.Gly84Glu), HRAS, KIT, MAX, MEN1, MET, MITF (c.952G>A, p.Glu318Lys variant only), MLH1, MSH2, MSH3, MSH6, MUTYH, NBN, NF1, NF2, NTHL1, PALB2, PDGFRA, PHOX2B, PMS2, POLD1, POLE, POT1, PRKAR1A, PTCH1, PTEN, RAD50, RAD51C, RAD51D, RB1, RECQL4, RET, RNF43, RUNX1, SDHAF2, SDHA (sequence changes only), SDHB, SDHC, SDHD, SMAD4, SMARCA4, SMARCB1, SMARCE1, STK11, SUFU, TERC, TERT, TMEM127, TP53, TSC1, TSC2, VHL, WRN and WT1.    04/19/2020 Surgery   RIGHT BREAST LUMPECTOMY WITH RADIOACTIVE SEED AND SENTINEL LYMPH NODE BIOPSY by Dr Marlou Starks    04/19/2020 Pathology Results   FINAL MICROSCOPIC DIAGNOSIS:   A. BREAST, RIGHT, LUMPECTOMY:  -  Invasive ductal carcinoma, Nottingham grade 2 of 3, 2.2 cm  -  Ductal carcinoma in-situ  -  Calcifications associated with carcinoma  -  Margins uninvolved by carcinoma (< 0.1 cm; posterior margin)  -  Previous biopsy site changes present  -  See oncology table and comment below   B. LYMPH NODE, RIGHT AXILLARY #1, SENTINEL, BIOPSY:  -  No carcinoma identified in one lymph node (0/1)  -  See comment   C. LYMPH NODE, RIGHT AXILLARY, SENTINEL, BIOPSY:  -  No carcinoma identified in one lymph node (0/1)  -  See comment  D. LYMPH NODE, RIGHT AXILLARY #2, SENTINEL, BIOPSY:  -  No carcinoma identified in one lymph node (0/1)  -  See comment   COMMENT:   A.  The previous biopsy and E-cadherin immunohistochemistry  [id number]) was reviewed in conjunction with this case and supports  the above interpretation.   B-D. Given the lobular-like morphology, cytokeratin AE1/3 was performed  on the sentinel lymph nodes to exclude micrometastasis.  There is no  evidence of metastatic carcinoma by immunohistochemistry.    05/28/2020 - 06/22/2020 Radiation Therapy   Adjuvant Radiation with Dr Lisbeth Renshaw    09/26/2020 Survivorship   SCP delivered by Cira Rue, NP       INTERVAL HISTORY:  Jean Morgan is here for a follow up of right breast  cancer   She was last seen by NP Lacie  on 07/01/2022. She presents to the clinic alone. Pt states she has no problems from the Exemestane, pt states the hot flashes are tolerable and joint pain is tolerable.Pt had a question about B12 labs.      All other systems were reviewed with the patient and are negative.  MEDICAL HISTORY:  Past Medical History:  Diagnosis Date   Anxiety    Cancer ([place])    skin   Depression    Family history of adverse reaction to anesthesia    brother had severe agitation coming out of anesthesia, had to be re-intubated   Family history of breast cancer    Family history of colon cancer    Family history of leukemia    Family history of ovarian cancer    Family history of prostate cancer    Family history of skin cancer    Heart murmur    "slight"   Hypothyroidism    Optic neuritis    Personal history of radiation therapy    [place] spotted fever    1970's    SURGICAL HISTORY: Past Surgical History:  Procedure Laterality Date   BREAST BIOPSY Right 02/01/2020   BREAST LUMPECTOMY Right 04/19/2020   BREAST LUMPECTOMY WITH RADIOACTIVE SEED AND SENTINEL LYMPH NODE BIOPSY Right 04/19/2020   Procedure: RIGHT BREAST LUMPECTOMY WITH RADIOACTIVE SEED AND SENTINEL LYMPH NODE BIOPSY;  Surgeon: [name], MD;  Location: [facility];  Service: General;  Laterality: Right;  [place]  2006   Right forearm   SENTINEL NODE BIOPSY Right 2006   TONSILLECTOMY     TONSILLECTOMY      I have reviewed the social history and family history with the patient and they are unchanged from previous note.  ALLERGIES:  is allergic to latex and oxycodone.  MEDICATIONS:  Current Outpatient Medications  Medication Sig Dispense Refill   benzonatate (TESSALON) 200 MG capsule Take 1 capsule (200 mg total) by mouth 2 (two) times daily as needed for cough. 20 capsule 0   Calcium-Vitamin D-Vitamin K [id number] MG-UNT-MCG CHEW Chew 2 each  by mouth daily.     cetirizine (ZYRTEC) 5 MG tablet Take 5 mg by mouth daily as needed for allergies.     citalopram (CELEXA) 20 MG tablet TAKE 1 TABLET BY MOUTH EVERY DAY 90 tablet 2   dextromethorphan-guaiFENesin (MUCINEX DM) 30-600 MG 12hr tablet Take 1 tablet by mouth 2 (two) times daily. 20 tablet 0   diazepam (VALIUM) 5 MG tablet Take 0.5-1 tablets (2.5-5 mg total) by mouth daily as needed for anxiety. 30 tablet 1   exemestane (AROMASIN)  25 MG tablet TAKE 1 TABLET BY MOUTH DAILY AFTER BREAKFAST. 90 tablet 3   GARLIC-LECITHIN PO Take 2 tablets by mouth daily.     QUERCETIN PO Take 2 tablets by mouth daily.     TURMERIC PO Take 2 tablets by mouth daily.     valACYclovir (VALTREX) 1000 MG tablet TAKE 1 TABLET ONCE DAILY FOR 5 DAYS AS NEEDED, START ASAP WITHIN 48 HOURS OF ONSET OF SYMPTOMS 30 tablet 1   No current facility-administered medications for this visit.    PHYSICAL EXAMINATION: ECOG PERFORMANCE STATUS: 0 - Asymptomatic  Vitals:   12/31/22 0814  BP: 134/74  Pulse: 76  Resp: 17  Temp: 98 F (36.7 C)  SpO2: 100%   Wt Readings from Last 3 Encounters:  12/31/22 158 lb 3.2 oz (71.8 kg)  12/08/22 156 lb 8 oz (71 kg)  11/07/22 155 lb 3.2 oz (70.4 kg)      NECK: (-)supple, thyroid normal size, non-tender, without nodularity LYMPH: (-) no palpable lymphadenopathy in the cervical, axillary  BREAST: rt breast lumpectomy little scar tissue in armpit. No palpable mass breast exam benign. LT breast no palpable mass breast exam benign.     LABORATORY DATA:  I have reviewed the data as listed    Latest Ref Rng & Units 12/31/2022    7:56 AM 07/01/2022    9:21 AM 12/25/2021    8:14 AM  CBC  WBC 4.0 - 10.5 K/uL 7.6  8.5  6.4   Hemoglobin 12.0 - 15.0 g/dL 13.1  13.7  13.3   Hematocrit 36.0 - 46.0 % 38.2  40.7  38.7   Platelets 150 - 400 K/uL 307  272  256         Latest Ref Rng & Units 12/31/2022    7:56 AM 07/01/2022    9:21 AM 03/19/2022    8:14 AM  CMP  Glucose 70 - 99  mg/dL 95  95  99   BUN 8 - 23 mg/dL '11  13  8   '$ Creatinine 0.44 - 1.00 mg/dL 0.78  0.79  0.77   Sodium 135 - 145 mmol/L 139  141  138   Potassium 3.5 - 5.1 mmol/L 3.7  4.7  4.1   Chloride 98 - 111 mmol/L 103  106  102   CO2 22 - 32 mmol/L '26  30  28   '$ Calcium 8.9 - 10.3 mg/dL 9.3  9.9  9.6   Total Protein 6.5 - 8.1 g/dL 7.6  7.3  7.3   Total Bilirubin 0.3 - 1.2 mg/dL 0.5  0.5  0.6   Alkaline Phos 38 - 126 U/L 89  86  91   AST 15 - 41 U/L '18  15  16   '$ ALT 0 - 44 U/L '14  11  14       '$ RADIOGRAPHIC STUDIES: I have personally reviewed the radiological images as listed and agreed with the findings in the report. No results found.    Orders Placed This Encounter  Procedures   CBC with Differential/Platelet    Standing Status:   Standing    Number of Occurrences:   50    Standing Expiration Date:   12/29/2023   Comprehensive metabolic panel    Standing Status:   Standing    Number of Occurrences:   50    Standing Expiration Date:   12/29/2023   Vitamin B12    Standing Status:   Standing    Number of Occurrences:  5    Standing Expiration Date:   12/29/2023   VITAMIN D 25 Hydroxy (Vit-D Deficiency, Fractures)    Standing Status:   Standing    Number of Occurrences:   2    Standing Expiration Date:   12/29/2023   All questions were answered. The patient knows to call the clinic with any problems, questions or concerns. No barriers to learning was detected. The total time spent in the appointment was 30 minutes.     Truitt Merle, MD 12/31/2022   Felicity Coyer, CMA, am acting as scribe for Truitt Merle, MD.   I have reviewed the above documentation for accuracy and completeness, and I agree with the above.

## 2022-12-31 ENCOUNTER — Inpatient Hospital Stay: Payer: BC Managed Care – PPO | Attending: Hematology

## 2022-12-31 ENCOUNTER — Inpatient Hospital Stay: Payer: BC Managed Care – PPO | Admitting: Hematology

## 2022-12-31 ENCOUNTER — Other Ambulatory Visit: Payer: Self-pay

## 2022-12-31 ENCOUNTER — Encounter: Payer: Self-pay | Admitting: Hematology

## 2022-12-31 VITALS — BP 134/74 | HR 76 | Temp 98.0°F | Resp 17 | Ht 66.0 in | Wt 158.2 lb

## 2022-12-31 DIAGNOSIS — Z808 Family history of malignant neoplasm of other organs or systems: Secondary | ICD-10-CM | POA: Insufficient documentation

## 2022-12-31 DIAGNOSIS — M2550 Pain in unspecified joint: Secondary | ICD-10-CM | POA: Insufficient documentation

## 2022-12-31 DIAGNOSIS — R232 Flushing: Secondary | ICD-10-CM | POA: Insufficient documentation

## 2022-12-31 DIAGNOSIS — E538 Deficiency of other specified B group vitamins: Secondary | ICD-10-CM

## 2022-12-31 DIAGNOSIS — Z170 Estrogen receptor positive status [ER+]: Secondary | ICD-10-CM | POA: Diagnosis not present

## 2022-12-31 DIAGNOSIS — E039 Hypothyroidism, unspecified: Secondary | ICD-10-CM | POA: Insufficient documentation

## 2022-12-31 DIAGNOSIS — M2560 Stiffness of unspecified joint, not elsewhere classified: Secondary | ICD-10-CM | POA: Insufficient documentation

## 2022-12-31 DIAGNOSIS — N6002 Solitary cyst of left breast: Secondary | ICD-10-CM | POA: Insufficient documentation

## 2022-12-31 DIAGNOSIS — E2839 Other primary ovarian failure: Secondary | ICD-10-CM | POA: Diagnosis not present

## 2022-12-31 DIAGNOSIS — Z8042 Family history of malignant neoplasm of prostate: Secondary | ICD-10-CM | POA: Diagnosis not present

## 2022-12-31 DIAGNOSIS — Z79811 Long term (current) use of aromatase inhibitors: Secondary | ICD-10-CM | POA: Insufficient documentation

## 2022-12-31 DIAGNOSIS — Z800 Family history of malignant neoplasm of digestive organs: Secondary | ICD-10-CM | POA: Insufficient documentation

## 2022-12-31 DIAGNOSIS — C50511 Malignant neoplasm of lower-outer quadrant of right female breast: Secondary | ICD-10-CM | POA: Insufficient documentation

## 2022-12-31 DIAGNOSIS — R6882 Decreased libido: Secondary | ICD-10-CM | POA: Insufficient documentation

## 2022-12-31 DIAGNOSIS — Z79899 Other long term (current) drug therapy: Secondary | ICD-10-CM | POA: Insufficient documentation

## 2022-12-31 DIAGNOSIS — Z803 Family history of malignant neoplasm of breast: Secondary | ICD-10-CM | POA: Diagnosis not present

## 2022-12-31 DIAGNOSIS — M8580 Other specified disorders of bone density and structure, unspecified site: Secondary | ICD-10-CM | POA: Diagnosis not present

## 2022-12-31 DIAGNOSIS — M85852 Other specified disorders of bone density and structure, left thigh: Secondary | ICD-10-CM | POA: Diagnosis not present

## 2022-12-31 DIAGNOSIS — Z885 Allergy status to narcotic agent status: Secondary | ICD-10-CM | POA: Insufficient documentation

## 2022-12-31 DIAGNOSIS — Z923 Personal history of irradiation: Secondary | ICD-10-CM | POA: Diagnosis not present

## 2022-12-31 DIAGNOSIS — Z85820 Personal history of malignant melanoma of skin: Secondary | ICD-10-CM | POA: Diagnosis not present

## 2022-12-31 DIAGNOSIS — Z806 Family history of leukemia: Secondary | ICD-10-CM | POA: Insufficient documentation

## 2022-12-31 DIAGNOSIS — Z8041 Family history of malignant neoplasm of ovary: Secondary | ICD-10-CM | POA: Insufficient documentation

## 2022-12-31 LAB — VITAMIN D 25 HYDROXY (VIT D DEFICIENCY, FRACTURES): Vit D, 25-Hydroxy: 37.3 ng/mL (ref 30–100)

## 2022-12-31 LAB — COMPREHENSIVE METABOLIC PANEL
ALT: 14 U/L (ref 0–44)
AST: 18 U/L (ref 15–41)
Albumin: 4.7 g/dL (ref 3.5–5.0)
Alkaline Phosphatase: 89 U/L (ref 38–126)
Anion gap: 10 (ref 5–15)
BUN: 11 mg/dL (ref 8–23)
CO2: 26 mmol/L (ref 22–32)
Calcium: 9.3 mg/dL (ref 8.9–10.3)
Chloride: 103 mmol/L (ref 98–111)
Creatinine, Ser: 0.78 mg/dL (ref 0.44–1.00)
GFR, Estimated: >60 mL/min (ref >60)
Glucose, Bld: 95 mg/dL (ref 70–99)
Potassium: 3.7 mmol/L (ref 3.5–5.1)
Sodium: 139 mmol/L (ref 135–145)
Total Bilirubin: 0.5 mg/dL (ref 0.3–1.2)
Total Protein: 7.6 g/dL (ref 6.5–8.1)

## 2022-12-31 LAB — CBC WITH DIFFERENTIAL/PLATELET
Abs Immature Granulocytes: 0.03 10*3/uL (ref 0.00–0.07)
Basophils Absolute: 0 10*3/uL (ref 0.0–0.1)
Basophils Relative: 1 %
Eosinophils Absolute: 0.2 10*3/uL (ref 0.0–0.5)
Eosinophils Relative: 3 %
HCT: 38.2 % (ref 36.0–46.0)
Hemoglobin: 13.1 g/dL (ref 12.0–15.0)
Immature Granulocytes: 0 %
Lymphocytes Relative: 33 %
Lymphs Abs: 2.5 10*3/uL (ref 0.7–4.0)
MCH: 31.6 pg (ref 26.0–34.0)
MCHC: 34.3 g/dL (ref 30.0–36.0)
MCV: 92.3 fL (ref 80.0–100.0)
Monocytes Absolute: 0.4 10*3/uL (ref 0.1–1.0)
Monocytes Relative: 5 %
Neutro Abs: 4.5 10*3/uL (ref 1.7–7.7)
Neutrophils Relative %: 58 %
Platelets: 307 10*3/uL (ref 150–400)
RBC: 4.14 MIL/uL (ref 3.87–5.11)
RDW: 12.4 % (ref 11.5–15.5)
WBC: 7.6 10*3/uL (ref 4.0–10.5)
nRBC: 0 % (ref 0.0–0.2)

## 2022-12-31 LAB — VITAMIN B12: Vitamin B-12: 2888 pg/mL — ABNORMAL HIGH (ref 180–914)

## 2023-02-10 ENCOUNTER — Other Ambulatory Visit: Payer: Self-pay

## 2023-02-11 ENCOUNTER — Ambulatory Visit
Admission: RE | Admit: 2023-02-11 | Discharge: 2023-02-11 | Disposition: A | Discharge status: Home / Self Care | Payer: BC Managed Care – PPO | Source: Ambulatory Visit | Attending: Nurse Practitioner | Admitting: Nurse Practitioner

## 2023-02-11 DIAGNOSIS — Z170 Estrogen receptor positive status [ER+]: Secondary | ICD-10-CM

## 2023-03-09 ENCOUNTER — Ambulatory Visit: Payer: BC Managed Care – PPO | Attending: General Surgery

## 2023-03-09 VITALS — Wt 159.4 lb

## 2023-03-09 DIAGNOSIS — Z483 Aftercare following surgery for neoplasm: Secondary | ICD-10-CM

## 2023-03-09 NOTE — Therapy (Signed)
OUTPATIENT PHYSICAL THERAPY SOZO SCREENING NOTE   Patient Name: Jean Morgan MRN: [id number] DOB:12/02/1960, 62 y.o., female Today's Date: 03/09/2023  PCP: [place], [name], MD REFERRING PROVIDER: Griselda Miner, MD   PT End of Session - 03/09/23 1635     Visit Number 25   # unchanged due to screen only   PT Start Time 1633    PT Stop Time 1642    PT Time Calculation (min) 9 min    Activity Tolerance Patient tolerated treatment well    Behavior During Therapy WFL for tasks assessed/performed             Past Medical History:  Diagnosis Date   Anxiety    Cancer ([facility])    skin   Depression    Family history of adverse reaction to anesthesia    brother had severe agitation coming out of anesthesia, had to be re-intubated   Family history of breast cancer    Family history of colon cancer    Family history of leukemia    Family history of ovarian cancer    Family history of prostate cancer    Family history of skin cancer    Heart murmur    "slight"   Hypothyroidism    Optic neuritis    Personal history of radiation therapy    [place] spotted fever    1970's   Past Surgical History:  Procedure Laterality Date   BREAST BIOPSY Right 02/01/2020   BREAST LUMPECTOMY Right 04/19/2020   BREAST LUMPECTOMY WITH RADIOACTIVE SEED AND SENTINEL LYMPH NODE BIOPSY Right 04/19/2020   Procedure: RIGHT BREAST LUMPECTOMY WITH RADIOACTIVE SEED AND SENTINEL LYMPH NODE BIOPSY;  Surgeon: Griselda Miner, MD;  Location: [facility];  Service: General;  Laterality: Right;  PEC BLOCK   DENTAL SURGERY     MELANOMA EXCISION  2006   Right forearm   SENTINEL NODE BIOPSY Right 2006   TONSILLECTOMY     TONSILLECTOMY     Patient Active Problem List   Diagnosis Date Noted   PAD (peripheral artery disease) (HCC) 07/09/2020   Carpal tunnel syndrome 03/09/2020   Genetic testing 02/29/2020   Family history of breast cancer    Family history of ovarian cancer    Family history of colon cancer     Family history of prostate cancer    Family history of skin cancer    Family history of leukemia    Malignant neoplasm of lower-outer quadrant of right breast of female, estrogen receptor positive ([facility]) 02/02/2020   Osteopenia 01/25/2020   B12 deficiency 01/04/2020   Hearing loss 08/18/2018   Hyperlipidemia 11/28/2016   Anxiety disorder 11/28/2016   Chronic fatigue 11/28/2016   Recurrent genital herpes 11/28/2016   Melanoma of skin, site unspecified 06/26/2013    REFERRING DIAG: right breast cancer at risk for lymphedema  THERAPY DIAG: Aftercare following surgery for neoplasm  PERTINENT HISTORY: Diagnosis of 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 Carolynne Edouard Radiation completed   has started anastrozole  PRECAUTIONS: right UE Lymphedema risk, None  SUBJECTIVE: Pt returns for her 3 month L-Dex screen.   PAIN:  Are you having pain? No  SOZO SCREENING: Patient was assessed today using the SOZO machine to determine the lymphedema index score. This was compared to her baseline score. It was determined that she is within  the recommended range when compared to her baseline and no further action is needed at this time. She will continue SOZO screenings. These are done every 3 months for 2 years post operatively followed by every 6 months for 2 years, and then annually.    L-DEX FLOWSHEETS - 03/09/23 1600       L-DEX LYMPHEDEMA SCREENING   Measurement Type Unilateral    L-DEX MEASUREMENT EXTREMITY Upper Extremity    POSITION  Standing    DOMINANT SIDE Right    At Risk Side Right    BASELINE SCORE (UNILATERAL) -1.8    L-DEX SCORE (UNILATERAL) 0    VALUE CHANGE (UNILAT) 1.8            P: Begin 6 month screens.   Hermenia Bers, PTA 03/09/2023, 4:43 PM

## 2023-03-25 ENCOUNTER — Encounter: Payer: BC Managed Care – PPO | Admitting: Family Medicine

## 2023-05-12 NOTE — Progress Notes (Unsigned)
HPI: Jean Morgan is a 63 y.o. female, who is here today for her routine physical.  Last CPE: 03/19/2022  Regular exercise 3 or more time per week: *** Following a healthy diet: ***  Chronic medical problems: Recurrent genital herpes,PAD,osteopenia,knee OA, right breast cancer s/p lumpectomy and radiation,B12 def,and fatigue among some.   Pap smear: 05/04/20 negative for malignancy and HPV. Hx of abnormal pap smears: Negative.  Immunization History  Administered Date(s) Administered   PFIZER(Purple Top)SARS-COV-2 Vaccination 06/09/2020   Health Maintenance  Topic Date Due   DTaP/Tdap/Td (1 - Tdap) Never done   Zoster Vaccines- Shingrix (1 of 2) Never done   COVID-19 Vaccine (2 - Pfizer risk series) 06/30/2020   PAP SMEAR-Modifier  05/05/2023   HIV Screening  08/19/2023 (Originally 11/19/1975)   INFLUENZA VACCINE  05/28/2023   Colonoscopy  10/02/2024   MAMMOGRAM  02/10/2025   Hepatitis C Screening  Completed   HPV VACCINES  Aged Out    She has *** concerns today.  HLD and PAD: She is not on pharmacologic treatment.  Lab Results  Component Value Date   CHOL 263 (H) 09/24/2022   HDL 54.70 09/24/2022   LDLCALC 187 (H) 09/24/2022   TRIG 106.0 09/24/2022   CHOLHDL 5 09/24/2022     Review of Systems  Current Outpatient Medications on File Prior to Visit  Medication Sig Dispense Refill   benzonatate (TESSALON) 200 MG capsule Take 1 capsule (200 mg total) by mouth 2 (two) times daily as needed for cough. 20 capsule 0   Calcium-Vitamin D-Vitamin K 500-100-40 MG-UNT-MCG CHEW Chew 2 each by mouth daily.     cetirizine (ZYRTEC) 5 MG tablet Take 5 mg by mouth daily as needed for allergies.     citalopram (CELEXA) 20 MG tablet TAKE 1 TABLET BY MOUTH EVERY DAY 90 tablet 2   dextromethorphan-guaiFENesin (MUCINEX DM) 30-600 MG 12hr tablet Take 1 tablet by mouth 2 (two) times daily. 20 tablet 0   diazepam (VALIUM) 5 MG tablet Take 0.5-1 tablets (2.5-5 mg total) by mouth daily  as needed for anxiety. 30 tablet 1   exemestane (AROMASIN) 25 MG tablet TAKE 1 TABLET BY MOUTH DAILY AFTER BREAKFAST. 90 tablet 3   GARLIC-LECITHIN PO Take 2 tablets by mouth daily.     QUERCETIN PO Take 2 tablets by mouth daily.     TURMERIC PO Take 2 tablets by mouth daily.     valACYclovir (VALTREX) 1000 MG tablet TAKE 1 TABLET ONCE DAILY FOR 5 DAYS AS NEEDED, START ASAP WITHIN 48 HOURS OF ONSET OF SYMPTOMS 30 tablet 1   No current facility-administered medications on file prior to visit.    Past Medical History:  Diagnosis Date   Anxiety    Cancer (HCC)    skin   Depression    Family history of adverse reaction to anesthesia    brother had severe agitation coming out of anesthesia, had to be re-intubated   Family history of breast cancer    Family history of colon cancer    Family history of leukemia    Family history of ovarian cancer    Family history of prostate cancer    Family history of skin cancer    Heart murmur    "slight"   Hypothyroidism    Optic neuritis    Personal history of radiation therapy    [place] spotted fever    1970's    Past Surgical History:  Procedure Laterality Date   BREAST BIOPSY  Right 02/01/2020   BREAST LUMPECTOMY Right 04/19/2020   BREAST LUMPECTOMY WITH RADIOACTIVE SEED AND SENTINEL LYMPH NODE BIOPSY Right 04/19/2020   Procedure: RIGHT BREAST LUMPECTOMY WITH RADIOACTIVE SEED AND SENTINEL LYMPH NODE BIOPSY;  Surgeon: Griselda Miner, MD;  Location: [facility];  Service: General;  Laterality: Right;  PEC BLOCK   DENTAL SURGERY     MELANOMA EXCISION  2006   Right forearm   SENTINEL NODE BIOPSY Right 2006   TONSILLECTOMY     TONSILLECTOMY      Allergies  Allergen Reactions   Latex Itching   Oxycodone Other (See Comments)    Other Reaction: "too strong a drug for me"    Family History  Problem Relation Age of Onset   Colon polyps Mother        more than 79   Colitis Mother    Arthritis Mother    Hyperlipidemia Mother     Diabetes Mother    Skin cancer Mother    Heart disease Father    Hyperlipidemia Father    Hypertension Father    Colon cancer Paternal Aunt        dx. in her 51s   Diabetes Maternal Grandmother    Breast cancer Maternal Grandmother        dx. >50   Breast cancer Other 55       maternal great-aunt   Ovarian cancer Other 53   Colon cancer Other 61   Skin cancer Other 53   Prostate cancer Half-Brother 50   Leukemia Maternal Great-grandmother 99       chronic    Social History   Socioeconomic History   Marital status: Married    Spouse name: Not on file   Number of children: 1   Years of education: Not on file   Highest education level: Not on file  Occupational History   Not on file  Tobacco Use   Smoking status: Never   Smokeless tobacco: Never  Vaping Use   Vaping status: Never Used  Substance and Sexual Activity   Alcohol use: Yes    Alcohol/week: 7.0 standard drinks of alcohol    Types: 7 Glasses of wine per week   Drug use: No   Sexual activity: Not on file  Other Topics Concern   Not on file  Social History Narrative   Not on file   Social Determinants of Health   Financial Resource Strain: Not on file  Food Insecurity: Not on file  Transportation Needs: Not on file  Physical Activity: Not on file  Stress: Not on file  Social Connections: Not on file    There were no vitals filed for this visit. There is no height or weight on file to calculate BMI.  Wt Readings from Last 3 Encounters:  03/09/23 159 lb 6 oz (72.3 kg)  12/31/22 158 lb 3.2 oz (71.8 kg)  12/08/22 156 lb 8 oz (71 kg)    Physical Exam  ASSESSMENT AND PLAN: Jean Morgan was here today annual physical examination.  No orders of the defined types were placed in this encounter.   There are no diagnoses linked to this encounter.  There are no diagnoses linked to this encounter.  No follow-ups on file.  Savannha Welle G. [place], MD  [facility] [place] office.

## 2023-05-13 ENCOUNTER — Telehealth: Payer: Self-pay | Admitting: Family Medicine

## 2023-05-13 ENCOUNTER — Encounter: Payer: Self-pay | Admitting: Family Medicine

## 2023-05-13 ENCOUNTER — Ambulatory Visit (INDEPENDENT_AMBULATORY_CARE_PROVIDER_SITE_OTHER): Payer: BC Managed Care – PPO

## 2023-05-13 ENCOUNTER — Ambulatory Visit (INDEPENDENT_AMBULATORY_CARE_PROVIDER_SITE_OTHER): Payer: BC Managed Care – PPO | Admitting: Family Medicine

## 2023-05-13 VITALS — BP 120/70 | HR 64 | Temp 98.7°F | Resp 16 | Ht 66.0 in | Wt 158.1 lb

## 2023-05-13 DIAGNOSIS — E785 Hyperlipidemia, unspecified: Secondary | ICD-10-CM

## 2023-05-13 DIAGNOSIS — R748 Abnormal levels of other serum enzymes: Secondary | ICD-10-CM

## 2023-05-13 DIAGNOSIS — Z0000 Encounter for general adult medical examination without abnormal findings: Secondary | ICD-10-CM | POA: Insufficient documentation

## 2023-05-13 DIAGNOSIS — F419 Anxiety disorder, unspecified: Secondary | ICD-10-CM | POA: Diagnosis not present

## 2023-05-13 DIAGNOSIS — H9191 Unspecified hearing loss, right ear: Secondary | ICD-10-CM

## 2023-05-13 DIAGNOSIS — I739 Peripheral vascular disease, unspecified: Secondary | ICD-10-CM

## 2023-05-13 DIAGNOSIS — R5382 Chronic fatigue, unspecified: Secondary | ICD-10-CM | POA: Diagnosis not present

## 2023-05-13 DIAGNOSIS — H6991 Unspecified Eustachian tube disorder, right ear: Secondary | ICD-10-CM

## 2023-05-13 DIAGNOSIS — R0602 Shortness of breath: Secondary | ICD-10-CM

## 2023-05-13 DIAGNOSIS — R0609 Other forms of dyspnea: Secondary | ICD-10-CM | POA: Insufficient documentation

## 2023-05-13 LAB — TSH: TSH: 3.34 u[IU]/mL (ref 0.35–5.50)

## 2023-05-13 LAB — LIPID PANEL
Cholesterol: 287 mg/dL — ABNORMAL HIGH (ref 0–200)
HDL: 64.2 mg/dL (ref >39.00)
LDL Cholesterol: 205 mg/dL — ABNORMAL HIGH (ref 0–99)
NonHDL: 223.23
Total CHOL/HDL Ratio: 4
Triglycerides: 90 mg/dL (ref 0.0–149.0)
VLDL: 18 mg/dL (ref 0.0–40.0)

## 2023-05-13 LAB — VITAMIN B12: Vitamin B-12: >1501 pg/mL — ABNORMAL HIGH (ref 211–911)

## 2023-05-13 MED ORDER — DIAZEPAM 5 MG PO TABS
2.5000 mg | ORAL_TABLET | Freq: Every day | ORAL | 1 refills | Status: DC | PRN
Start: 2023-05-13 — End: 2024-05-13

## 2023-05-13 NOTE — Telephone Encounter (Signed)
I'm sorry, the patient asked for it so I just sent the message--she does not have Medicare so will not need the AWV.  Thanks so much.

## 2023-05-13 NOTE — Assessment & Plan Note (Signed)
Stable otherwise. Continue Celexa 20 mg daily and diazepam 5 mg 1/2 to 1 tablet daily as needed. Follow-up in 6 to 12 months.

## 2023-05-13 NOTE — Assessment & Plan Note (Signed)
Problem has not been well-controlled, she has not been interested in trying pharmacologic treatment. Continue nonpharmacologic treatment. Further recommendation will be given according to fasting lipid panel results.

## 2023-05-13 NOTE — Assessment & Plan Note (Signed)
Reporting 2 years history of exertional dyspnea. We discussed possible etiologies. Problem has been otherwise stable. I think cardiology evaluation is warranted. Instructed about warning signs. Further recommendation will be given according to chest x-ray.

## 2023-05-13 NOTE — Assessment & Plan Note (Signed)
We discussed the importance of regular physical activity and healthy diet for prevention of chronic illness and/or complications. Preventive guidelines reviewed. Vaccination: Declined Tdap and Shingrix. Ca++ and vit D supplementation to continue. Next CPE in a year.

## 2023-05-13 NOTE — Assessment & Plan Note (Signed)
We discussed possible etiologies. Some of her comorbidities as well as medications could be contributing factors. Further recommendation will be given according to lab results.

## 2023-05-13 NOTE — Assessment & Plan Note (Signed)
Chronic, noted to be worse after traveling to [place] and reporting gradual improvement. Hearing test done today abnormal, no prior test available for comparison. Hearing Screening   500Hz  1000Hz  2000Hz  4000Hz   Right ear Fail Fail Pass Fail  Left ear Fail Fail Fail Fail   We discussed a few options, given the fact problem is gradually improving, I think we can continue monitoring for a few weeks.  She agrees with recommendations, will let me know if problem is not resolved in a few weeks, before if he gets worse.

## 2023-05-13 NOTE — Patient Instructions (Addendum)
A few things to remember from today's visit:  Routine general medical examination at a health care facility  PAD (peripheral artery disease) (HCC)  Hyperlipidemia, unspecified hyperlipidemia type - Plan: TSH, Lipid panel  Anxiety disorder, unspecified type - Plan: diazepam (VALIUM) 5 MG tablet  Elevated vitamin B12 level - Plan: Vitamin B12  SOB (shortness of breath) on exertion - Plan: DG Chest 2 View, Ambulatory referral to Cardiology  Chronic fatigue - Plan: TSH  Hearing loss of right ear, unspecified hearing loss type  Dysfunction of right eustachian tube  If you need refills for medications you take chronically, please call your pharmacy. Do not use My Chart to request refills or for acute issues that need immediate attention. If you send a my chart message, it may take a few days to be addressed, specially if I am not in the office.  Please be sure medication list is accurate. If a new problem present, please set up appointment sooner than planned today.  Health Maintenance, Female Adopting a healthy lifestyle and getting preventive care are important in promoting health and wellness. Ask your health care provider about: The right schedule for you to have regular tests and exams. Things you can do on your own to prevent diseases and keep yourself healthy. What should I know about diet, weight, and exercise? Eat a healthy diet  Eat a diet that includes plenty of vegetables, fruits, low-fat dairy products, and lean protein. Do not eat a lot of foods that are high in solid fats, added sugars, or sodium. Maintain a healthy weight Body mass index (BMI) is used to identify weight problems. It estimates body fat based on height and weight. Your health care provider can help determine your BMI and help you achieve or maintain a healthy weight. Get regular exercise Get regular exercise. This is one of the most important things you can do for your health. Most adults  should: Exercise for at least 150 minutes each week. The exercise should increase your heart rate and make you sweat (moderate-intensity exercise). Do strengthening exercises at least twice a week. This is in addition to the moderate-intensity exercise. Spend less time sitting. Even light physical activity can be beneficial. Watch cholesterol and blood lipids Have your blood tested for lipids and cholesterol at 63 years of age, then have this test every 5 years. Have your cholesterol levels checked more often if: Your lipid or cholesterol levels are high. You are older than 64 years of age. You are at high risk for heart disease. What should I know about cancer screening? Depending on your health history and family history, you may need to have cancer screening at various ages. This may include screening for: Breast cancer. Cervical cancer. Colorectal cancer. Skin cancer. Lung cancer. What should I know about heart disease, diabetes, and high blood pressure? Blood pressure and heart disease High blood pressure causes heart disease and increases the risk of stroke. This is more likely to develop in people who have high blood pressure readings or are overweight. Have your blood pressure checked: Every 3-5 years if you are 15-102 years of age. Every year if you are 4 years old or older. Diabetes Have regular diabetes screenings. This checks your fasting blood sugar level. Have the screening done: Once every three years after age 48 if you are at a normal weight and have a low risk for diabetes. More often and at a younger age if you are overweight or have a high risk for diabetes.  What should I know about preventing infection? Hepatitis B If you have a higher risk for hepatitis B, you should be screened for this virus. Talk with your health care provider to find out if you are at risk for hepatitis B infection. Hepatitis C Testing is recommended for: Everyone born from [birth year] through  1966. Anyone with known risk factors for hepatitis C. Sexually transmitted infections (STIs) Get screened for STIs, including gonorrhea and chlamydia, if: You are sexually active and are younger than 63 years of age. You are older than 63 years of age and your health care provider tells you that you are at risk for this type of infection. Your sexual activity has changed since you were last screened, and you are at increased risk for chlamydia or gonorrhea. Ask your health care provider if you are at risk. Ask your health care provider about whether you are at high risk for HIV. Your health care provider may recommend a prescription medicine to help prevent HIV infection. If you choose to take medicine to prevent HIV, you should first get tested for HIV. You should then be tested every 3 months for as long as you are taking the medicine. Pregnancy If you are about to stop having your period (premenopausal) and you may become pregnant, seek counseling before you get pregnant. Take 400 to 800 micrograms (mcg) of folic acid every day if you become pregnant. Ask for birth control (contraception) if you want to prevent pregnancy. Osteoporosis and menopause Osteoporosis is a disease in which the bones lose minerals and strength with aging. This can result in bone fractures. If you are 76 years old or older, or if you are at risk for osteoporosis and fractures, ask your health care provider if you should: Be screened for bone loss. Take a calcium or vitamin D supplement to lower your risk of fractures. Be given hormone replacement therapy (HRT) to treat symptoms of menopause. Follow these instructions at home: Alcohol use Do not drink alcohol if: Your health care provider tells you not to drink. You are pregnant, may be pregnant, or are planning to become pregnant. If you drink alcohol: Limit how much you have to: 0-1 drink a day. Know how much alcohol is in your drink. In the [place] one drink  equals one 12 oz bottle of beer (355 mL), one 5 oz glass of wine (148 mL), or one 1 oz glass of hard liquor (44 mL). Lifestyle Do not use any products that contain nicotine or tobacco. These products include cigarettes, chewing tobacco, and vaping devices, such as e-cigarettes. If you need help quitting, ask your health care provider. Do not use street drugs. Do not share needles. Ask your health care provider for help if you need support or information about quitting drugs. General instructions Schedule regular health, dental, and eye exams. Stay current with your vaccines. Tell your health care provider if: You often feel depressed. You have ever been abused or do not feel safe at home. Summary Adopting a healthy lifestyle and getting preventive care are important in promoting health and wellness. Follow your health care provider's instructions about healthy diet, exercising, and getting tested or screened for diseases. Follow your health care provider's instructions on monitoring your cholesterol and blood pressure. This information is not intended to replace advice given to you by your health care provider. Make sure you discuss any questions you have with your health care provider. Document Revised: 03/04/2021 Document Reviewed: 03/04/2021 Elsevier Patient Education  2024 Elsevier  Inc.

## 2023-05-13 NOTE — Assessment & Plan Note (Signed)
She has declined statin medication in the past.We discussed CV benefits of this type of medications. Further recommendation will be given according to lipid panel result.

## 2023-05-13 NOTE — Telephone Encounter (Signed)
Pt needs to schedule an AWV.

## 2023-05-13 NOTE — Assessment & Plan Note (Signed)
She is no longer on B12 supplementation. I do not think further workup is needed at this time. We will continue following regularly.

## 2023-06-05 ENCOUNTER — Other Ambulatory Visit: Payer: Self-pay | Admitting: Nurse Practitioner

## 2023-06-05 ENCOUNTER — Other Ambulatory Visit: Payer: Self-pay | Admitting: Family Medicine

## 2023-06-05 DIAGNOSIS — F419 Anxiety disorder, unspecified: Secondary | ICD-10-CM

## 2023-07-05 NOTE — Progress Notes (Unsigned)
Patient Care Team: [place], [name], MD as PCP - General (Family Medicine) Venancio Poisson, MD as Consulting Physician (Dermatology) Pershing Proud, RN as Oncology Nurse Navigator Donnelly Angelica, RN as Oncology Nurse Navigator Griselda Miner, MD as Consulting Physician (General Surgery) Malachy Mood, MD as Consulting Physician (Hematology) Dorothy Puffer, MD as Consulting Physician (Radiation Oncology) Pollyann Samples, NP as Nurse Practitioner (Nurse Practitioner)   CHIEF COMPLAINT: Follow up right breast cancer   Oncology History Overview Note  Cancer Staging Malignant neoplasm of lower-outer quadrant of right breast of female, estrogen receptor positive (HCC) Staging form: Breast, AJCC 8th Edition - Clinical stage from 01/31/2020: Stage IB (cT2, cN0, cM0, G2, ER+, PR+, HER2-) - Signed by Malachy Mood, MD on 02/07/2020    Malignant neoplasm of lower-outer quadrant of right breast of female, estrogen receptor positive (HCC)  01/23/2020 Imaging   DEXA 01/23/20  Osteopenia with lowest T-score -1.6 at left femur neck  Site Region Measured Date Measured Age YA BMD Significant CHANGE T-score DualFemur Neck Left  01/23/2020    60.1         -1.6    0.815 g/cm2   AP Spine  L1-L4 (L3) 01/23/2020    60.1         -1.1    1.035 g/cm2   DualFemur Total Mean 01/23/2020    60.1         -1.2    0.851 g/cm2   01/23/2020 Mammogram   Diangostic Mammogram 01/23/20  IMPRESSION: 1. There is a highly suspicious mass in the right breast at 6 o'clock 3 cm from the nipple demonstrates an irregular hypoechoic shadowing mass measuring 2.3 x 1.4 x 1.7 cm associated with pleomorphic calcifications.   2.  No evidence of right axillary lymphadenopathy.   3. There is a benign-appearing cyst in the superior left breast at 12:30, 3 cm from the nipple demonstrates an anechoic oval circumscribed mass measuring 0.9 x 0.8 x 0.9 cm. No evidence of left breast malignancy.    01/31/2020 Cancer Staging   Staging form: Breast,  AJCC 8th Edition - Clinical stage from 01/31/2020: Stage IB (cT2, cN0, cM0, G2, ER+, PR+, HER2-) - Signed by Malachy Mood, MD on 02/07/2020   01/31/2020 Initial Biopsy   Diagnosis 01/31/20 Breast, right, needle core biopsy, 6 o'clock - INVASIVE MAMMARY CARCINOMA AND MAMMARY CARCINOMA IN SITU WITH PERINEURAL INVASION AND CALCIFICATIONS. Microscopic Comment The carcinoma is nuclear grade 2. The greatest linear extent of tumor in any one core is 16 mm. Immunohistochemistry of E-cadherin will be reported separately. Ancillary studies will be reported separately.   01/31/2020 Receptors her2   PROGNOSTIC INDICATORS Results: IMMUNOHISTOCHEMICAL AND MORPHOMETRIC ANALYSIS PERFORMED MANUALLY The tumor cells are NEGATIVE for Her2 (1+). Estrogen Receptor: 100%, POSITIVE, STRONG STAINING INTENSITY Progesterone Receptor: 80%, POSITIVE, STRONG STAINING INTENSITY Proliferation Marker Ki67: 15%   01/31/2020 Oncotype testing   Low risk with recurrence score 17.  Distant risk of recurrence at 9 years 5% with AI or Tamoxifen. There is less than 1% benefit of chemotherapy.    02/02/2020 Initial Diagnosis   Malignant neoplasm of lower-outer quadrant of right breast of female, estrogen receptor positive (HCC)   02/17/2020 Imaging   Breast MRI  IMPRESSION: Known malignancy in the LOWER central portion of the RIGHT breast measuring 3.1 centimeters.   No adenopathy in the RIGHT axilla.   No additional areas of concern are identified in either breast.     02/27/2020 -  Neo-Adjuvant Anti-estrogen oral therapy  Anastrozole 1mg  once daily starting in 02/27/20 with half tablet to see if tolerable.    02/28/2020 Genetic Testing   Negative genetic testing:  No pathogenic variants detected on the [facility] Multi-Cancer Panel. Two variants of uncertain significance were detected - one in the HOXB13 gene called c.215G>T and one in the RNF43 gene called c.2139A>T. The report date is 02/28/2020.  The Multi-Cancer Panel offered by  [facility] includes sequencing and/or deletion duplication testing of the following 85 genes: AIP, ALK, APC, ATM, AXIN2,BAP1,  BARD1, BLM, BMPR1A, BRCA1, BRCA2, BRIP1, CASR, CDC73, CDH1, CDK4, CDKN1B, CDKN1C, CDKN2A (p14ARF), CDKN2A (p16INK4a), CEBPA, CHEK2, CTNNA1, DICER1, DIS3L2, EGFR (c.2369C>T, p.Thr790Met variant only), EPCAM (Deletion/duplication testing only), FH, FLCN, GATA2, GPC3, GREM1 (Promoter region deletion/duplication testing only), HOXB13 (c.251G>A, p.Gly84Glu), HRAS, KIT, MAX, MEN1, MET, MITF (c.952G>A, p.Glu318Lys variant only), MLH1, MSH2, MSH3, MSH6, MUTYH, NBN, NF1, NF2, NTHL1, PALB2, PDGFRA, PHOX2B, PMS2, POLD1, POLE, POT1, PRKAR1A, PTCH1, PTEN, RAD50, RAD51C, RAD51D, RB1, RECQL4, RET, RNF43, RUNX1, SDHAF2, SDHA (sequence changes only), SDHB, SDHC, SDHD, SMAD4, SMARCA4, SMARCB1, SMARCE1, STK11, SUFU, TERC, TERT, TMEM127, TP53, TSC1, TSC2, VHL, WRN and WT1.    04/19/2020 Surgery   RIGHT BREAST LUMPECTOMY WITH RADIOACTIVE SEED AND SENTINEL LYMPH NODE BIOPSY by Dr Carolynne Edouard    04/19/2020 Pathology Results   FINAL MICROSCOPIC DIAGNOSIS:   A. BREAST, RIGHT, LUMPECTOMY:  -  Invasive ductal carcinoma, Nottingham grade 2 of 3, 2.2 cm  -  Ductal carcinoma in-situ  -  Calcifications associated with carcinoma  -  Margins uninvolved by carcinoma (< 0.1 cm; posterior margin)  -  Previous biopsy site changes present  -  See oncology table and comment below   B. LYMPH NODE, RIGHT AXILLARY #1, SENTINEL, BIOPSY:  -  No carcinoma identified in one lymph node (0/1)  -  See comment   C. LYMPH NODE, RIGHT AXILLARY, SENTINEL, BIOPSY:  -  No carcinoma identified in one lymph node (0/1)  -  See comment   D. LYMPH NODE, RIGHT AXILLARY #2, SENTINEL, BIOPSY:  -  No carcinoma identified in one lymph node (0/1)  -  See comment   COMMENT:   A.  The previous biopsy and E-cadherin immunohistochemistry  ([id number]) was reviewed in conjunction with this case and supports  the above interpretation.    B-D. Given the lobular-like morphology, cytokeratin AE1/3 was performed  on the sentinel lymph nodes to exclude micrometastasis.  There is no  evidence of metastatic carcinoma by immunohistochemistry.    05/28/2020 - 06/22/2020 Radiation Therapy   Adjuvant Radiation with Dr Mitzi Hansen    09/26/2020 Survivorship   SCP delivered by Santiago Glad, NP       CURRENT THERAPY: ntiestrogen therapy, started 02/2020             -currently exemestane since 05/2021  INTERVAL HISTORY Ms. Isais returns for follow up as scheduled. Last seen by Dr. Mosetta Putt 12/31/22. Continues exemestane. Mammogram 02/11/23 was benign.   ROS   Past Medical History:  Diagnosis Date   Anxiety    Cancer (HCC)    skin   Depression    Family history of adverse reaction to anesthesia    brother had severe agitation coming out of anesthesia, had to be re-intubated   Family history of breast cancer    Family history of colon cancer    Family history of leukemia    Family history of ovarian cancer    Family history of prostate cancer    Family history of skin cancer  Heart murmur    "slight"   Hypothyroidism    Optic neuritis    Personal history of radiation therapy    [facility] spotted fever    1970's     Past Surgical History:  Procedure Laterality Date   BREAST BIOPSY Right 02/01/2020   BREAST LUMPECTOMY Right 04/19/2020   BREAST LUMPECTOMY WITH RADIOACTIVE SEED AND SENTINEL LYMPH NODE BIOPSY Right 04/19/2020   Procedure: RIGHT BREAST LUMPECTOMY WITH RADIOACTIVE SEED AND SENTINEL LYMPH NODE BIOPSY;  Surgeon: Griselda Miner, MD;  Location: [facility] OR;  Service: General;  Laterality: Right;  PEC BLOCK   DENTAL SURGERY     MELANOMA EXCISION  2006   Right forearm   SENTINEL NODE BIOPSY Right 2006   TONSILLECTOMY     TONSILLECTOMY       Outpatient Encounter Medications as of 07/08/2023  Medication Sig   Calcium-Vitamin D-Vitamin K 500-100-40 MG-UNT-MCG CHEW Chew 2 each by mouth daily.   cetirizine (ZYRTEC) 5 MG  tablet Take 5 mg by mouth daily as needed for allergies.   citalopram (CELEXA) 20 MG tablet TAKE 1 TABLET BY MOUTH EVERY DAY   diazepam (VALIUM) 5 MG tablet Take 0.5-1 tablets (2.5-5 mg total) by mouth daily as needed for anxiety.   exemestane (AROMASIN) 25 MG tablet TAKE 1 TABLET BY MOUTH DAILY AFTER BREAKFAST.   GARLIC-LECITHIN PO Take 2 tablets by mouth daily.   QUERCETIN PO Take 2 tablets by mouth daily.   TURMERIC PO Take 2 tablets by mouth daily.   valACYclovir (VALTREX) 1000 MG tablet TAKE 1 TABLET ONCE DAILY FOR 5 DAYS AS NEEDED, START ASAP WITHIN 48 HOURS OF ONSET OF SYMPTOMS   No facility-administered encounter medications on file as of 07/08/2023.     There were no vitals filed for this visit. There is no height or weight on file to calculate BMI.   PHYSICAL EXAM GENERAL:alert, no distress and comfortable SKIN: no rash  EYES: sclera clear NECK: without mass LYMPH:  no palpable cervical or supraclavicular lymphadenopathy  LUNGS: clear with normal breathing effort HEART: regular rate & rhythm, no lower extremity edema ABDOMEN: abdomen soft, non-tender and normal bowel sounds NEURO: alert & oriented x 3 with fluent speech, no focal motor/sensory deficits Breast exam:  PAC without erythema    CBC    Component Value Date/Time   WBC 7.6 12/31/2022 0756   RBC 4.14 12/31/2022 0756   HGB 13.1 12/31/2022 0756   HGB 13.7 07/01/2022 0921   HCT 38.2 12/31/2022 0756   PLT 307 12/31/2022 0756   PLT 272 07/01/2022 0921   MCV 92.3 12/31/2022 0756   MCV 88.9 07/12/2016 1423   MCH 31.6 12/31/2022 0756   MCHC 34.3 12/31/2022 0756   RDW 12.4 12/31/2022 0756   LYMPHSABS 2.5 12/31/2022 0756   MONOABS 0.4 12/31/2022 0756   EOSABS 0.2 12/31/2022 0756   BASOSABS 0.0 12/31/2022 0756     CMP     Component Value Date/Time   NA 139 12/31/2022 0756   K 3.7 12/31/2022 0756   CL 103 12/31/2022 0756   CO2 26 12/31/2022 0756   GLUCOSE 95 12/31/2022 0756   BUN 11 12/31/2022 0756    CREATININE 0.78 12/31/2022 0756   CREATININE 0.79 07/01/2022 0921   CREATININE 0.68 09/01/2014 1057   CALCIUM 9.3 12/31/2022 0756   PROT 7.6 12/31/2022 0756   ALBUMIN 4.7 12/31/2022 0756   AST 18 12/31/2022 0756   AST 15 07/01/2022 0921   ALT 14 12/31/2022 0756  ALT 11 07/01/2022 0921   ALKPHOS 89 12/31/2022 0756   BILITOT 0.5 12/31/2022 0756   BILITOT 0.5 07/01/2022 0921   GFRNONAA >60 12/31/2022 0756   GFRNONAA >60 07/01/2022 0921   GFRAA >60 04/13/2020 0949   GFRAA >60 02/08/2020 1218     ASSESSMENT & PLAN:Jean Morgan is a 62 y.o. female with    1. Malignant neoplasm of lower-outer quadrant of right breast, Stage Ib, p(T2N0M0), ER/PR+, HER2-, Grade II, Oncotype RS 17  -Diagnosed in April 2021, invasive and in situ ductal carcinoma -S/p right breast lumpectomy with Dr Carolynne Edouard on 04/19/20 and adjuvant Radiation with Dr Mitzi Hansen.  -She started anastrozole before surgery in 02/2020. She has multiple mild side effects, including low libido, skin and eye dryness, hair thinning, hot flashes and joint stiffness, and fatigue.  She switched to exemestane in 05/2021, but she is still experiencing joint stiffness. She feels this is more tolerable than anastrozole. -Mammograms in 01/2021 and 01/2022 lead to biopsies, all path benign. Mammo 01/2023 was benign   2.  B12 abnormality -She previously had low B12 and started supplementation, level became elevated and she has been off for over a year -B12 level remains >1000, today's level is pending -We reviewed possible causes; no evidence of liver or renal dysfunction.  No clinical suspicion for hematologic malignancy with normal CBC.  -Etiology remains unclear, we will continue monitoring.  Hold B12 supplement   3. Osteopenia -She was recently diagnosed by 12/2019 DEXA, lowest T score -1.6 in LFN.   -Repeat DEXA 06/25/2022 shows slightly worsened osteopenia in the LFN, T score -1.9.  FRAX score is not high. -I reviewed that AI can weaken her bone  density.  We discussed options including continue exemestane and consider medication for bone density such as Fosamax, Prolia, Xgeva, or Zometa first switch to tamoxifen which has a bone strengthening quality. -The pros and cons were discussed, she prefers to continue exemestane and hold off on other medication for now.  She will maximize calcium, vitamin D, and weightbearing exercise   4. Antiestrogen side effects Management -She has multiple mild side effects from anastrozole: low libido, skin and eye dryness, hair thinning, hot flashes and joint stiffness.  -She also has chronic fatigue but notes she has always had that -she switched to exemestane on 06/24/21. She reports continued joint stiffness, but knee pain has resolved.  Manageable in her ankles and wrists.   -She notes she gains weight when she switches antiestrogen drugs, prefers not to switch for now   5. Covid-19+ 02/19/21 -experienced cough, nasal congestion, post nasal drip, dry throat. -vaccinated x3, most recently 01/04/21 -she has recovered well, taste has returned. -Questions if her chronic fatigue is related to previous COVID   6. 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 She was recently found to have elevated B12 and told to stop her supplement. She notes increased fatigue without it. I recommend she take every other day. 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 S/p removal.  -Continue Celexa, mood stable, did not get worse after she started anastrozole.   7. Genetic Testing negative for pathogenetic mutations  PLAN:  No orders of the defined types were placed in this encounter.     All questions were answered. The patient knows to call the clinic with any problems, questions or concerns. No barriers to learning were detected. I spent *** counseling the patient face to face. The  total time spent in the  appointment was *** and more than 50% was on counseling, review of test results, and coordination of care.   Santiago Glad, NP-C @DATE @

## 2023-07-08 ENCOUNTER — Encounter: Payer: Self-pay | Admitting: Nurse Practitioner

## 2023-07-08 ENCOUNTER — Inpatient Hospital Stay: Payer: BC Managed Care – PPO | Admitting: Nurse Practitioner

## 2023-07-08 ENCOUNTER — Inpatient Hospital Stay: Payer: BC Managed Care – PPO | Attending: Hematology

## 2023-07-08 VITALS — BP 142/74 | HR 66 | Temp 98.1°F | Resp 13 | Wt 159.5 lb

## 2023-07-08 DIAGNOSIS — R5382 Chronic fatigue, unspecified: Secondary | ICD-10-CM | POA: Insufficient documentation

## 2023-07-08 DIAGNOSIS — Z9089 Acquired absence of other organs: Secondary | ICD-10-CM | POA: Diagnosis not present

## 2023-07-08 DIAGNOSIS — Z85820 Personal history of malignant melanoma of skin: Secondary | ICD-10-CM | POA: Diagnosis not present

## 2023-07-08 DIAGNOSIS — Z800 Family history of malignant neoplasm of digestive organs: Secondary | ICD-10-CM | POA: Diagnosis not present

## 2023-07-08 DIAGNOSIS — Z79811 Long term (current) use of aromatase inhibitors: Secondary | ICD-10-CM | POA: Insufficient documentation

## 2023-07-08 DIAGNOSIS — E538 Deficiency of other specified B group vitamins: Secondary | ICD-10-CM

## 2023-07-08 DIAGNOSIS — Z8042 Family history of malignant neoplasm of prostate: Secondary | ICD-10-CM | POA: Diagnosis not present

## 2023-07-08 DIAGNOSIS — Z806 Family history of leukemia: Secondary | ICD-10-CM | POA: Insufficient documentation

## 2023-07-08 DIAGNOSIS — Z8041 Family history of malignant neoplasm of ovary: Secondary | ICD-10-CM | POA: Diagnosis not present

## 2023-07-08 DIAGNOSIS — C50511 Malignant neoplasm of lower-outer quadrant of right female breast: Secondary | ICD-10-CM | POA: Insufficient documentation

## 2023-07-08 DIAGNOSIS — Z803 Family history of malignant neoplasm of breast: Secondary | ICD-10-CM | POA: Insufficient documentation

## 2023-07-08 DIAGNOSIS — Z808 Family history of malignant neoplasm of other organs or systems: Secondary | ICD-10-CM | POA: Diagnosis not present

## 2023-07-08 DIAGNOSIS — M8580 Other specified disorders of bone density and structure, unspecified site: Secondary | ICD-10-CM | POA: Insufficient documentation

## 2023-07-08 DIAGNOSIS — R0600 Dyspnea, unspecified: Secondary | ICD-10-CM | POA: Insufficient documentation

## 2023-07-08 DIAGNOSIS — Z8616 Personal history of COVID-19: Secondary | ICD-10-CM | POA: Diagnosis not present

## 2023-07-08 DIAGNOSIS — Z170 Estrogen receptor positive status [ER+]: Secondary | ICD-10-CM | POA: Insufficient documentation

## 2023-07-08 DIAGNOSIS — Z923 Personal history of irradiation: Secondary | ICD-10-CM | POA: Diagnosis not present

## 2023-07-08 DIAGNOSIS — M25572 Pain in left ankle and joints of left foot: Secondary | ICD-10-CM | POA: Insufficient documentation

## 2023-07-08 DIAGNOSIS — M25571 Pain in right ankle and joints of right foot: Secondary | ICD-10-CM | POA: Insufficient documentation

## 2023-07-08 DIAGNOSIS — Z79899 Other long term (current) drug therapy: Secondary | ICD-10-CM | POA: Diagnosis not present

## 2023-07-08 DIAGNOSIS — R635 Abnormal weight gain: Secondary | ICD-10-CM | POA: Insufficient documentation

## 2023-07-08 LAB — COMPREHENSIVE METABOLIC PANEL
ALT: 14 U/L (ref 0–44)
AST: 17 U/L (ref 15–41)
Albumin: 4.4 g/dL (ref 3.5–5.0)
Alkaline Phosphatase: 78 U/L (ref 38–126)
Anion gap: 8 (ref 5–15)
BUN: 9 mg/dL (ref 8–23)
CO2: 25 mmol/L (ref 22–32)
Calcium: 9.4 mg/dL (ref 8.9–10.3)
Chloride: 106 mmol/L (ref 98–111)
Creatinine, Ser: 0.75 mg/dL (ref 0.44–1.00)
GFR, Estimated: >60 mL/min (ref >60)
Glucose, Bld: 102 mg/dL — ABNORMAL HIGH (ref 70–99)
Potassium: 4 mmol/L (ref 3.5–5.1)
Sodium: 139 mmol/L (ref 135–145)
Total Bilirubin: 0.7 mg/dL (ref 0.3–1.2)
Total Protein: 7.1 g/dL (ref 6.5–8.1)

## 2023-07-08 LAB — CBC WITH DIFFERENTIAL/PLATELET
Abs Immature Granulocytes: 0.03 10*3/uL (ref 0.00–0.07)
Basophils Absolute: 0.1 10*3/uL (ref 0.0–0.1)
Basophils Relative: 1 %
Eosinophils Absolute: 0.2 10*3/uL (ref 0.0–0.5)
Eosinophils Relative: 2 %
HCT: 40.1 % (ref 36.0–46.0)
Hemoglobin: 13.9 g/dL (ref 12.0–15.0)
Immature Granulocytes: 0 %
Lymphocytes Relative: 25 %
Lymphs Abs: 2.3 10*3/uL (ref 0.7–4.0)
MCH: 32 pg (ref 26.0–34.0)
MCHC: 34.7 g/dL (ref 30.0–36.0)
MCV: 92.2 fL (ref 80.0–100.0)
Monocytes Absolute: 0.5 10*3/uL (ref 0.1–1.0)
Monocytes Relative: 5 %
Neutro Abs: 6.2 10*3/uL (ref 1.7–7.7)
Neutrophils Relative %: 67 %
Platelets: 294 10*3/uL (ref 150–400)
RBC: 4.35 MIL/uL (ref 3.87–5.11)
RDW: 12.6 % (ref 11.5–15.5)
WBC: 9.2 10*3/uL (ref 4.0–10.5)
nRBC: 0 % (ref 0.0–0.2)

## 2023-07-08 LAB — VITAMIN B12: Vitamin B-12: 2006 pg/mL — ABNORMAL HIGH (ref 180–914)

## 2023-07-15 ENCOUNTER — Encounter: Payer: Self-pay | Admitting: Cardiology

## 2023-07-15 ENCOUNTER — Ambulatory Visit: Payer: BC Managed Care – PPO | Attending: Cardiology | Admitting: Cardiology

## 2023-07-15 VITALS — BP 124/86 | HR 76 | Ht 66.0 in | Wt 159.8 lb

## 2023-07-15 DIAGNOSIS — R0609 Other forms of dyspnea: Secondary | ICD-10-CM | POA: Diagnosis not present

## 2023-07-15 DIAGNOSIS — E785 Hyperlipidemia, unspecified: Secondary | ICD-10-CM

## 2023-07-15 DIAGNOSIS — I739 Peripheral vascular disease, unspecified: Secondary | ICD-10-CM | POA: Diagnosis not present

## 2023-07-15 NOTE — Patient Instructions (Addendum)
Medication Instructions:  No changes   *If you need a refill on your cardiac medications before your next appointment, please call your pharmacy*   Lab Work: Not needed .   Testing/Procedures: Will be schedule at [facility]  Your physician has recommended that you have a cardiopulmonary stress test (CPX). CPX testing is a non-invasive measurement of heart and lung function. It replaces a traditional treadmill stress test. This type of test provides a tremendous amount of information that relates not only to your present condition but also for future outcomes. This test combines measurements of you ventilation, respiratory gas exchange in the lungs, electrocardiogram (EKG), blood pressure and physical response before, during, and following an exercise protocol.    Follow-Up: At [facility], you and your health needs are our priority.  As part of our continuing mission to provide you with exceptional heart care, we have created designated Provider Care Teams.  These Care Teams include your primary Cardiologist (physician) and Advanced Practice Providers (APPs -  Physician Assistants and Nurse Practitioners) who all work together to provide you with the care you need, when you need it.     Your next appointment:   3 month(s)  The format for your next appointment:   In Person  Provider:      Other Instructions    CPX TEST PATIENT INSTRUCTIONS    You are scheduled for a Cardiopulmonary Exercise (CPX) Test at [facility] on:  ____/____/____ at ____:____ am/pm.  Expect to be in the lab for 2 hours. Please plan to arrive 30 MINUTES PRIOR to your appointment. You may be asked to reschedule your test if you arrive 20 minutes or more after your scheduled appointment time.  Main Campus address: [address] [place], [place] [address]  *You may arrive to Main Entrance A or Entrance C (Free valet parking is available at both).  - Main Entrance A (from [facility]):  proceed to admitting for check-in  - Entrance C (from [facility]): proceed to [employer] parking or under hospital deck parking using this code _______________  Check-in: [facility] waiting room   General instructions for the day of the test (Please follow all instructions from your physician):   Refrain from ingesting a heavy meal, alcohol, or caffeine or using tobacco products within 2 hours of the test (DO NOT FAST for more than 8 hours). You may have all other non-alcoholic, non-caffeinated beverages a light snack (crackers, a piece of fruit, carrot sticks, toast, bagel, etc.) up to your appointment time.   Avoid significant exertion or exercise within 24 hours of your test.   Be prepared to exercise and sweat. Your clothing should permit freedom of movement and include walking or running shoes. Women bring loose-fitting, short-sleeved blouse.   This evaluation may be fatiguing, and you may wish to have someone accompany you to the assessment to drive you home afterward.   Bring a list of your medications with you, including dosage and frequency you take the medication (i.e., once per day, twice per day, etc).   Take all medications as prescribed, unless noted below or instructed to do so by your physician.  o Please do not take the following medications prior to your CPX:  Brief description of the test:  A brief lung function test will be performed. This will involve you taking deep breaths and blowing out hard and fast through your mouth. During these, a clip will be on your nose and you  will be breathing through a breathing device.  For the exercise portion of the test you will be walking on a treadmill, or riding a stationary bike, to your maximal effort or until symptoms such as chest pain, shortness of breath, leg pain or dizziness limit your exercise. You will be breathing in and out of a breathing device through your mouth (a clip will be on your nose  again). Your heart rate, ECG, blood pressure, oxygen saturation, breathing rate and depth, amount of oxygen you consume and amount of carbon dioxide you produce will be measured and monitored throughout the exercise test.  If you need to cancel or reschedule your appointment please call: [phone]  If you have any further questions please call your physician or Philip Aspen, MS, ACSM-

## 2023-07-15 NOTE — Progress Notes (Signed)
Cardiology Office Note:  .   Date:  07/18/2023  ID:  Jean Morgan, DOB 1961-09-03, MRN [id number] PCP: [place], [name], MD  [facility] Providers Cardiologist:  Bryan Lemma, MD     Chief Complaint  Patient presents with   New Patient (Initial Visit)    Evaluation of shortness of breath    History of Present Illness: Jean Morgan is a  62 y.o. female with a PMH notable for HLD, OA (knee), R Br CA (s/p Lumpectomy & XRT), as well as reported PAD who presents here for EVALUATION OF EXERTIONAL DYSPNEA (& HLD) at the request of [place], [name], MD.  Jean Morgan was just seen on July 17 by Dr. [place] for routine evaluation.  Noted maintaining a "so-so diet with daily vegetable consumption.  Walking routinely and getting at least 8 hours of sleep at night.  Rare alcohol consumption and non-smoker.  She noted shortness of breath over the past 2 years walking up and down stairs that she attributed to COVID-19.  No chest pain or pressure.  Recently returned from high-altitude trip to [place] and climbed up most of [employer].  Also noted some URI/allergy symptoms.  Nervous about shortness of breath. => In the past she declined use of statin, lipids were rechecked. => Referred to cardiology because of dyspnea.     Subjective  INTERVAL HISTORY Jean Morgan presents here today accompanied by her mother.  She seems to be doing okay but does comment on the shortness of breath.  She says that the dyspnea is definitely worse whenever the temperature is hot.  She was able to climb [facility] but did notice some dyspnea but probably not more than would be expected.  She has not had any chest pain or pressure but just feels anxious and nervous about potential CAD.  Her father had a stroke and mother has hypertension and hyperlipidemia as well as diabetes.  No cardiac history in the family. She says it really the dyspnea has been ever since she had first had COVID and she is not 3  different occasions of COVID.  Denies any coughing or wheezing but does have some allergy symptoms.  Cardiovascular ROS: positive for - dyspnea on exertion, palpitations, and palpitations just rare occasional fluttering sensations.  Not concerning.  Exertional dyspnea has been persistent and worse during hot weather. negative for - chest pain, dyspnea on exertion, orthopnea, paroxysmal nocturnal dyspnea, rapid heart rate, shortness of breath, or syncope or near syncope, TIA or amaurosis fugax, claudication  ROS:  Review of Systems - Negative except maybe a little fatigue and anxiety.  Some seasonal allergies.     Objective   Studies Reviewed: Marland Kitchen   EKG Interpretation Date/Time:  Wednesday July 15 2023 13:58:08 EDT Ventricular Rate:  76 PR Interval:  122 QRS Duration:  80 QT Interval:  372 QTC Calculation: 418 R Axis:   36  Text Interpretation: Normal sinus rhythm Normal ECG No previous ECGs available Confirmed by Bryan Lemma ([id number]) on 07/18/2023 4:56:36 PM    ABIs 06/12/2020: Normal bilateral ABIs.  Abnormal R TBI suggesting peripheral/distal PAD CXR 05/13/2023: No acute CP abnormality.  Lab Results  Component Value Date   CHOL 287 (H) 05/13/2023   HDL 64.20 05/13/2023   LDLCALC 205 (H) 05/13/2023   TRIG 90.0 05/13/2023   CHOLHDL 4 05/13/2023    Risk Assessment/Calculations:       Physical Exam:   VS:  BP 124/86   Pulse 76   Ht 5\' 6"  (1.676 m)   Wt 159 lb 12.8 oz (72.5 kg)   SpO2 96%   BMI 25.79 kg/m    Wt Readings from Last 3 Encounters:  07/15/23 159 lb 12.8 oz (72.5 kg)  07/08/23 159 lb 8 oz (72.3 kg)  05/13/23 158 lb 2 oz (71.7 kg)    GEN: Well-nourished, well-groomed.  Healthy-appearing; in no acute distress; w NECK: No JVD; No carotid bruits CARDIAC: Normal S1, S2; RRR, no murmurs, rubs, gallops RESPIRATORY:  Clear to auscultation without rales, wheezing or rhonchi ; nonlabored, good air movement. ABDOMEN: Soft, non-tender,  non-distended EXTREMITIES:  No edema; No deformity      ASSESSMENT AND PLAN: .    Problem List Items Addressed This Visit       Cardiology Problems   Hyperlipidemia LDL goal <70 (Chronic)    With TBI's suggesting possible peripheral artery disease, reasonable to treat for LDL least less than 100 if not less than 70.  Repeat lipid panel showed LDL of 205 and she has been reluctant to treat.  She is taking turmeric and garlic, but I doubt that this will have much of an effect.  Would probably benefit from at least a trial of statin but may very well require PCSK9 inhibitor injectable medicine such as inclisiran.      Relevant Orders   Cardiopulmonary exercise test   Cardiac Stress Test: Informed Consent Details: Physician/Practitioner Attestation; Transcribe to consent form and obtain patient signature   PAD (peripheral artery disease) (HCC) (Chronic)    Last LDL was 90.  TBI's were abnormal when checked in the past, but ABIs were all normal.  She is not complaining of any claudication symptoms.   Despite this, her LDL level is 205.  I do think she would probably require treatment as there is likely a familial component.  I suspect she probably would need lipid clinic assessment.      Relevant Orders   EKG 12-Lead (Completed)   Cardiopulmonary exercise test   Cardiac Stress Test: Informed Consent Details: Physician/Practitioner Attestation; Transcribe to consent form and obtain patient signature     Other   Dyspnea on exertion - Primary    This is to be a longstanding issue for her but she did well in March.  She.  Fredricka Bonine that she has cardiac disease, but with significant cholesterol normality, reasonable to exclude CAD.  Will start off with GXT since she has a very easily interpretable EKG.  Plan: GXT Based on results would potentially want to consider getting a coronary calcium score versus more aggressive ischemic evaluation in order to determine options of further  management. No CHF symptoms, and normal EKG, will hold off on echo for now.      Relevant Orders   Cardiopulmonary exercise test   Cardiac Stress Test: Informed Consent Details: Physician/Practitioner Attestation; Transcribe to consent form and obtain patient signature        Informed Consent   Shared Decision Making/Informed Consent The risks [chest pain, shortness of breath, cardiac arrhythmias, dizziness, blood pressure fluctuations, myocardial infarction, stroke/transient ischemic attack, and life-threatening complications (estimated to be 1 in 10,000)], benefits (risk stratification, diagnosing coronary artery disease, treatment guidance) and alternatives of an exercise tolerance test were discussed in detail with Ms. Guardiola and she agrees to proceed.      Dispo: Return in about 3 months (around 10/14/2023).  Total time spent: 28 min spent with patient + 18 min spent  charting = 46 min  Signed, Marykay Lex, MD, MS Bryan Lemma, M.D., M.S. Interventional Cardiologist  [facility] [phone] Pager # [phone] Phone # [phone] [address] [place], [place] [address]

## 2023-07-18 ENCOUNTER — Encounter: Payer: Self-pay | Admitting: Cardiology

## 2023-07-18 NOTE — Assessment & Plan Note (Signed)
With TBI's suggesting possible peripheral artery disease, reasonable to treat for LDL least less than 100 if not less than 70.  Repeat lipid panel showed LDL of 205 and she has been reluctant to treat.  She is taking turmeric and garlic, but I doubt that this will have much of an effect.  Would probably benefit from at least a trial of statin but may very well require PCSK9 inhibitor injectable medicine such as inclisiran.

## 2023-07-18 NOTE — Assessment & Plan Note (Signed)
Last LDL was 90.  TBI's were abnormal when checked in the past, but ABIs were all normal.  She is not complaining of any claudication symptoms.   Despite this, her LDL level is 205.  I do think she would probably require treatment as there is likely a familial component.  I suspect she probably would need lipid clinic assessment.

## 2023-07-18 NOTE — Assessment & Plan Note (Addendum)
This is to be a longstanding issue for her but she did well in March.  She.  Fredricka Bonine that she has cardiac disease, but with significant cholesterol normality, reasonable to exclude CAD.  Will start off with GXT since she has a very easily interpretable EKG.  Plan: GXT Based on results would potentially want to consider getting a coronary calcium score versus more aggressive ischemic evaluation in order to determine options of further management. No CHF symptoms, and normal EKG, will hold off on echo for now.

## 2023-07-29 ENCOUNTER — Encounter (HOSPITAL_COMMUNITY): Payer: BC Managed Care – PPO

## 2023-09-07 ENCOUNTER — Ambulatory Visit: Payer: BC Managed Care – PPO | Attending: General Surgery

## 2023-09-07 VITALS — Wt 159.4 lb

## 2023-09-07 DIAGNOSIS — Z483 Aftercare following surgery for neoplasm: Secondary | ICD-10-CM | POA: Insufficient documentation

## 2023-09-07 NOTE — Therapy (Signed)
OUTPATIENT PHYSICAL THERAPY SOZO SCREENING NOTE   Patient Name: BREON CAPOTE MRN: [id number] DOB:01-19-1961, 62 y.o., female Today's Date: 09/07/2023  PCP: [place], [name], MD REFERRING PROVIDER: Griselda Miner, MD   PT End of Session - 09/07/23 1510     Visit Number 25   # unchanged due to screen only   PT Start Time 1508    PT Stop Time 1512    PT Time Calculation (min) 4 min    Activity Tolerance Patient tolerated treatment well    Behavior During Therapy WFL for tasks assessed/performed             Past Medical History:  Diagnosis Date   Anxiety    Cancer (HCC)    skin   Depression    Family history of adverse reaction to anesthesia    brother had severe agitation coming out of anesthesia, had to be re-intubated   Family history of breast cancer    Family history of colon cancer    Family history of leukemia    Family history of ovarian cancer    Family history of prostate cancer    Family history of skin cancer    Heart murmur    "slight"   Hypothyroidism    Optic neuritis    Personal history of radiation therapy    Rocky [place] spotted fever    1970's   Past Surgical History:  Procedure Laterality Date   BREAST BIOPSY Right 02/01/2020   BREAST LUMPECTOMY Right 04/19/2020   BREAST LUMPECTOMY WITH RADIOACTIVE SEED AND SENTINEL LYMPH NODE BIOPSY Right 04/19/2020   Procedure: RIGHT BREAST LUMPECTOMY WITH RADIOACTIVE SEED AND SENTINEL LYMPH NODE BIOPSY;  Surgeon: Griselda Miner, MD;  Location: [facility] OR;  Service: General;  Laterality: Right;  PEC BLOCK   DENTAL SURGERY     MELANOMA EXCISION  2006   Right forearm   SENTINEL NODE BIOPSY Right 2006   TONSILLECTOMY     TONSILLECTOMY     Patient Active Problem List   Diagnosis Date Noted   Routine general medical examination at a health care facility 05/13/2023   Dyspnea on exertion 05/13/2023   PAD (peripheral artery disease) (HCC) 07/09/2020   Carpal tunnel syndrome 03/09/2020   Genetic testing  02/29/2020   Family history of breast cancer    Family history of ovarian cancer    Family history of colon cancer    Family history of prostate cancer    Family history of skin cancer    Family history of leukemia    Malignant neoplasm of lower-outer quadrant of right breast of female, estrogen receptor positive (HCC) 02/02/2020   Osteopenia 01/25/2020   Elevated vitamin B12 level 01/04/2020   Hearing loss 08/18/2018   Hyperlipidemia LDL goal <70 11/28/2016   Anxiety disorder 11/28/2016   Chronic fatigue 11/28/2016   Recurrent genital herpes 11/28/2016   Melanoma of skin (HCC) 06/26/2013    REFERRING DIAG: right breast cancer at risk for lymphedema  THERAPY DIAG: Aftercare following surgery for neoplasm  PERTINENT HISTORY: Diagnosis of 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 Carolynne Edouard Radiation completed   has started anastrozole  PRECAUTIONS: right UE Lymphedema risk, None  SUBJECTIVE: Pt returns for her 6 month L-Dex screen.   PAIN:  Are you having pain? No  SOZO SCREENING: Patient was assessed today using the  SOZO machine to determine the lymphedema index score. This was compared to her baseline score. It was determined that she is within the recommended range when compared to her baseline and no further action is needed at this time. She will continue SOZO screenings. These are done every 3 months for 2 years post operatively followed by every 6 months for 2 years, and then annually.    L-DEX FLOWSHEETS - 09/07/23 1500       L-DEX LYMPHEDEMA SCREENING   Measurement Type Unilateral    L-DEX MEASUREMENT EXTREMITY Upper Extremity    POSITION  Standing    DOMINANT SIDE Right    At Risk Side Right    BASELINE SCORE (UNILATERAL) -1.8    L-DEX SCORE (UNILATERAL) 0.4    VALUE CHANGE (UNILAT) 2.2            P: Last  6 month screen next.   Hermenia Bers, PTA 09/07/2023, 3:11 PM

## 2023-10-19 ENCOUNTER — Other Ambulatory Visit: Payer: Self-pay | Admitting: Family Medicine

## 2023-10-19 ENCOUNTER — Telehealth: Payer: Self-pay

## 2023-10-19 DIAGNOSIS — A6000 Herpesviral infection of urogenital system, unspecified: Secondary | ICD-10-CM

## 2023-10-19 MED ORDER — VALACYCLOVIR HCL 1 G PO TABS: ORAL_TABLET | ORAL | 1 refills | Status: DC

## 2023-10-19 NOTE — Telephone Encounter (Signed)
Rx sent in already

## 2023-10-19 NOTE — Telephone Encounter (Signed)
Copied from CRM [id number]. Topic: Clinical - Medication Refill >> Oct 19, 2023 10:16 AM Drema Balzarine wrote: Most Recent Primary Care Visit:  Provider: [place], [name]  Department: [facility]  Visit Type: PHYSICAL  Date: 05/13/2023  Medication: valACYclovir (VALTREX) 1000 MG tablet [[id number]]  Has the patient contacted their pharmacy? Yes (Agent: If no, request that the patient contact the pharmacy for the refill. If patient does not wish to contact the pharmacy document the reason why and proceed with request.) (Agent: If yes, when and what did the pharmacy advise?)  Is this the correct pharmacy for this prescription? Yes If no, delete pharmacy and type the correct one.  This is the patient's preferred pharmacy:    [id number] - [place], [place] - [address] AT [address] OF [facility] [address] [place] [address] Phone: [phone] Fax: [phone]   Has the prescription been filled recently? Yes  Is the patient out of the medication? No  Has the patient been seen for an appointment in the last year OR does the patient have an upcoming appointment? Yes  Can we respond through MyChart? Yes  Agent: Please be advised that Rx refills may take up to 3 business days. We ask that you follow-up with your pharmacy.

## 2023-10-20 ENCOUNTER — Telehealth (INDEPENDENT_AMBULATORY_CARE_PROVIDER_SITE_OTHER): Payer: BC Managed Care – PPO | Admitting: Internal Medicine

## 2023-10-20 DIAGNOSIS — F419 Anxiety disorder, unspecified: Secondary | ICD-10-CM

## 2023-10-20 DIAGNOSIS — J309 Allergic rhinitis, unspecified: Secondary | ICD-10-CM | POA: Diagnosis not present

## 2023-10-20 DIAGNOSIS — J019 Acute sinusitis, unspecified: Secondary | ICD-10-CM | POA: Diagnosis not present

## 2023-10-20 MED ORDER — DOXYCYCLINE HYCLATE 100 MG PO TABS: 100.0000 mg | ORAL_TABLET | Freq: Two times a day (BID) | ORAL | 0 refills | Status: DC

## 2023-10-20 MED ORDER — PREDNISONE 10 MG PO TABS: ORAL_TABLET | ORAL | 0 refills | Status: DC

## 2023-10-20 MED ORDER — HYDROCODONE BIT-HOMATROP MBR 5-1.5 MG/5ML PO SOLN: 5.0000 mL | Freq: Four times a day (QID) | ORAL | 0 refills | Status: AC | PRN

## 2023-10-20 NOTE — Progress Notes (Signed)
Patient ID: Jean Morgan, female   DOB: 11-04-1960, 61 y.o.   MRN: [id number]  Virtual Visit via Video Note  I connected with Jean Morgan on 10/24/23 at  8:00 AM EST by a video enabled telemedicine application and verified that I am speaking with the correct person using two identifiers.  Location of all participants today Patient: at home Provider: at office   I discussed the limitations of evaluation and management by telemedicine and the availability of in person appointments. The patient expressed understanding and agreed to proceed.  History of Present Illness:  Here with 2-3 days acute onset fever, facial pain, pressure, headache, general weakness and malaise, and greenish d/c, with mild ST and cough, but pt denies chest pain, wheezing, increased sob or doe, orthopnea, PND, increased LE swelling, palpitations, dizziness or syncope.  Does have several wks ongoing nasal allergy symptoms with clearish congestion, itch and sneezing, without fever, pain, ST, cough, swelling or wheezing.   Pt denies polydipsia, polyuria, or new focal neuro s/s.   Denies worsening depressive symptoms, suicidal ideation, or panic Past Medical History:  Diagnosis Date   Anxiety    Cancer (HCC)    skin   Depression    Family history of adverse reaction to anesthesia    brother had severe agitation coming out of anesthesia, had to be re-intubated   Family history of breast cancer    Family history of colon cancer    Family history of leukemia    Family history of ovarian cancer    Family history of prostate cancer    Family history of skin cancer    Heart murmur    "slight"   Hypothyroidism    Optic neuritis    Personal history of radiation therapy    [place] spotted fever    1970's   Past Surgical History:  Procedure Laterality Date   BREAST BIOPSY Right 02/01/2020   BREAST LUMPECTOMY Right 04/19/2020   BREAST LUMPECTOMY WITH RADIOACTIVE SEED AND SENTINEL LYMPH NODE BIOPSY Right  04/19/2020   Procedure: RIGHT BREAST LUMPECTOMY WITH RADIOACTIVE SEED AND SENTINEL LYMPH NODE BIOPSY;  Surgeon: Griselda Miner, MD;  Location: [facility] OR;  Service: General;  Laterality: Right;  PEC BLOCK   DENTAL SURGERY     MELANOMA EXCISION  2006   Right forearm   SENTINEL NODE BIOPSY Right 2006   TONSILLECTOMY     TONSILLECTOMY      reports that she has never smoked. She has never used smokeless tobacco. She reports current alcohol use of about 7.0 standard drinks of alcohol per week. She reports that she does not use drugs. family history includes Arthritis in her mother; Breast cancer in her maternal grandmother; Breast cancer (age of onset: 20) in an other family member; Colitis in her mother; Colon cancer in her paternal aunt; Colon cancer (age of onset: 74) in an other family member; Colon polyps in her mother; Diabetes in her maternal grandmother and mother; Heart disease in her father; Hyperlipidemia in her father and mother; Hypertension in her father; Leukemia (age of onset: 15) in her maternal great-grandmother; Ovarian cancer (age of onset: 27) in an other family member; Prostate cancer (age of onset: 84) in her half-brother; Skin cancer in her mother; Skin cancer (age of onset: 13) in an other family member. Allergies  Allergen Reactions   Latex Itching   Oxycodone Other (See Comments)    Other Reaction: "too strong a drug for me"   Current Outpatient Medications  on File Prior to Visit  Medication Sig Dispense Refill   Calcium-Vitamin D-Vitamin K 500-100-40 MG-UNT-MCG CHEW Chew 2 each by mouth daily.     cetirizine (ZYRTEC) 5 MG tablet Take 5 mg by mouth daily as needed for allergies.     citalopram (CELEXA) 20 MG tablet TAKE 1 TABLET BY MOUTH EVERY DAY 90 tablet 2   diazepam (VALIUM) 5 MG tablet Take 0.5-1 tablets (2.5-5 mg total) by mouth daily as needed for anxiety. 20 tablet 1   exemestane (AROMASIN) 25 MG tablet TAKE 1 TABLET BY MOUTH DAILY AFTER BREAKFAST. 90 tablet 3    GARLIC-LECITHIN PO Take 2 tablets by mouth daily.     QUERCETIN PO Take 2 tablets by mouth daily.     TURMERIC PO Take 2 tablets by mouth daily.     valACYclovir (VALTREX) 1000 MG tablet TAKE 1 TABLET ONCE DAILY FOR 5 DAYS AS NEEDED, START ASAP WITHIN 48 HOURS OF ONSET OF SYMPTOMS 30 tablet 1   No current facility-administered medications on file prior to visit.    Observations/Objective: Alert, NAD, appropriate mood and affect, resps normal, cn 2-12 intact, moves all 4s, no visible rash or swelling Lab Results  Component Value Date   WBC 9.2 07/08/2023   HGB 13.9 07/08/2023   HCT 40.1 07/08/2023   PLT 294 07/08/2023   GLUCOSE 102 (H) 07/08/2023   CHOL 287 (H) 05/13/2023   TRIG 90.0 05/13/2023   HDL 64.20 05/13/2023   LDLCALC 205 (H) 05/13/2023   ALT 14 07/08/2023   AST 17 07/08/2023   NA 139 07/08/2023   K 4.0 07/08/2023   CL 106 07/08/2023   CREATININE 0.75 07/08/2023   BUN 9 07/08/2023   CO2 25 07/08/2023   TSH 3.34 05/13/2023   HGBA1C 5.5 01/25/2021   Assessment and Plan: See notes  Follow Up Instructions: See notes   I discussed the assessment and treatment plan with the patient. The patient was provided an opportunity to ask questions and all were answered. The patient agreed with the plan and demonstrated an understanding of the instructions.   The patient was advised to call back or seek an in-person evaluation if the symptoms worsen or if the condition fails to improve as anticipated.   Oliver Barre, MD

## 2023-10-24 ENCOUNTER — Encounter: Payer: Self-pay | Admitting: Internal Medicine

## 2023-10-24 DIAGNOSIS — J019 Acute sinusitis, unspecified: Secondary | ICD-10-CM | POA: Insufficient documentation

## 2023-10-24 DIAGNOSIS — J309 Allergic rhinitis, unspecified: Secondary | ICD-10-CM | POA: Insufficient documentation

## 2023-10-24 NOTE — Assessment & Plan Note (Signed)
Mild to mod, declines need for change in tx at this time,  to f/u any worsening symptoms or concerns

## 2023-10-24 NOTE — Assessment & Plan Note (Signed)
Mild to mod, for antibx course doxycyline 100 bid, cough med prn,,  to f/u any worsening symptoms or concerns

## 2023-10-24 NOTE — Assessment & Plan Note (Signed)
Mild to mod, for prednisone taper,,  to f/u any worsening symptoms or concerns  

## 2023-10-24 NOTE — Patient Instructions (Signed)
Please take all new medication as prescribed 

## 2023-11-04 ENCOUNTER — Ambulatory Visit: Payer: BC Managed Care – PPO | Admitting: Cardiology

## 2023-11-30 ENCOUNTER — Other Ambulatory Visit: Payer: Self-pay

## 2023-11-30 ENCOUNTER — Telehealth: Payer: Self-pay

## 2023-11-30 NOTE — Telephone Encounter (Signed)
Pt called stating that [facility] to schedule her mammogram and [place].  Pt was told that the [place] orders were not placed.  Pt called and stated that she and Lacie Discussed the [place] last time pt was seen in clinic.  This nurse reviewed pt's chart and no order was placed.  Stated this nurse will check with Santiago Glad, NP regarding the [place] orders.

## 2023-12-03 ENCOUNTER — Other Ambulatory Visit: Payer: Self-pay | Admitting: Nurse Practitioner

## 2023-12-03 ENCOUNTER — Telehealth: Payer: Self-pay | Admitting: Nurse Practitioner

## 2023-12-03 DIAGNOSIS — Z170 Estrogen receptor positive status [ER+]: Secondary | ICD-10-CM

## 2023-12-03 NOTE — Telephone Encounter (Signed)
 Rescheduled appointments per provider request. Patient is aware of the made appointments and will be mailed an appointment reminder.

## 2024-01-01 ENCOUNTER — Other Ambulatory Visit: Payer: Self-pay

## 2024-01-01 DIAGNOSIS — E538 Deficiency of other specified B group vitamins: Secondary | ICD-10-CM

## 2024-01-01 DIAGNOSIS — C50511 Malignant neoplasm of lower-outer quadrant of right female breast: Secondary | ICD-10-CM

## 2024-01-03 NOTE — Progress Notes (Unsigned)
 Patient Care Team: [place], [name], MD as PCP - General (Family Medicine) Marykay Lex, MD as PCP - Cardiology (Cardiology) Venancio Poisson, MD as Consulting Physician (Dermatology) Pershing Proud, RN as Oncology Nurse Navigator Donnelly Angelica, RN as Oncology Nurse Navigator Griselda Miner, MD as Consulting Physician (General Surgery) Malachy Mood, MD as Consulting Physician (Hematology) Dorothy Puffer, MD as Consulting Physician (Radiation Oncology) Pollyann Samples, NP as Nurse Practitioner (Nurse Practitioner)   CHIEF COMPLAINT: Follow up right breast cancer   Oncology History Overview Note  Cancer Staging Malignant neoplasm of lower-outer quadrant of right breast of female, estrogen receptor positive (HCC) Staging form: Breast, AJCC 8th Edition - Clinical stage from 01/31/2020: Stage IB (cT2, cN0, cM0, G2, ER+, PR+, HER2-) - Signed by Malachy Mood, MD on 02/07/2020    Malignant neoplasm of lower-outer quadrant of right breast of female, estrogen receptor positive (HCC)  01/23/2020 Imaging   DEXA 01/23/20  Osteopenia with lowest T-score -1.6 at left femur neck  Site Region Measured Date Measured Age YA BMD Significant CHANGE T-score DualFemur Neck Left  01/23/2020    60.1         -1.6    0.815 g/cm2   AP Spine  L1-L4 (L3) 01/23/2020    60.1         -1.1    1.035 g/cm2   DualFemur Total Mean 01/23/2020    60.1         -1.2    0.851 g/cm2   01/23/2020 Mammogram   Diangostic Mammogram 01/23/20  IMPRESSION: 1. There is a highly suspicious mass in the right breast at 6 o'clock 3 cm from the nipple demonstrates an irregular hypoechoic shadowing mass measuring 2.3 x 1.4 x 1.7 cm associated with pleomorphic calcifications.   2.  No evidence of right axillary lymphadenopathy.   3. There is a benign-appearing cyst in the superior left breast at 12:30, 3 cm from the nipple demonstrates an anechoic oval circumscribed mass measuring 0.9 x 0.8 x 0.9 cm. No evidence of left breast  malignancy.    01/31/2020 Cancer Staging   Staging form: Breast, AJCC 8th Edition - Clinical stage from 01/31/2020: Stage IB (cT2, cN0, cM0, G2, ER+, PR+, HER2-) - Signed by Malachy Mood, MD on 02/07/2020   01/31/2020 Initial Biopsy   Diagnosis 01/31/20 Breast, right, needle core biopsy, 6 o'clock - INVASIVE MAMMARY CARCINOMA AND MAMMARY CARCINOMA IN SITU WITH PERINEURAL INVASION AND CALCIFICATIONS. Microscopic Comment The carcinoma is nuclear grade 2. The greatest linear extent of tumor in any one core is 16 mm. Immunohistochemistry of E-cadherin will be reported separately. Ancillary studies will be reported separately.   01/31/2020 Receptors her2   PROGNOSTIC INDICATORS Results: IMMUNOHISTOCHEMICAL AND MORPHOMETRIC ANALYSIS PERFORMED MANUALLY The tumor cells are NEGATIVE for Her2 (1+). Estrogen Receptor: 100%, POSITIVE, STRONG STAINING INTENSITY Progesterone Receptor: 80%, POSITIVE, STRONG STAINING INTENSITY Proliferation Marker Ki67: 15%   01/31/2020 Oncotype testing   Low risk with recurrence score 17.  Distant risk of recurrence at 9 years 5% with AI or Tamoxifen. There is less than 1% benefit of chemotherapy.    02/02/2020 Initial Diagnosis   Malignant neoplasm of lower-outer quadrant of right breast of female, estrogen receptor positive (HCC)   02/17/2020 Imaging   Breast MRI  IMPRESSION: Known malignancy in the LOWER central portion of the RIGHT breast measuring 3.1 centimeters.   No adenopathy in the RIGHT axilla.   No additional areas of concern are identified in either breast.  02/27/2020 -  Neo-Adjuvant Anti-estrogen oral therapy   Anastrozole 1mg  once daily starting in 02/27/20 with half tablet to see if tolerable.    02/28/2020 Genetic Testing   Negative genetic testing:  No pathogenic variants detected on the [facility] Multi-Cancer Panel. Two variants of uncertain significance were detected - one in the HOXB13 gene called c.215G>T and one in the RNF43 gene called c.2139A>T. The  report date is 02/28/2020.  The Multi-Cancer Panel offered by [facility] includes sequencing and/or deletion duplication testing of the following 85 genes: AIP, ALK, APC, ATM, AXIN2,BAP1,  BARD1, BLM, BMPR1A, BRCA1, BRCA2, BRIP1, CASR, CDC73, CDH1, CDK4, CDKN1B, CDKN1C, CDKN2A (p14ARF), CDKN2A (p16INK4a), CEBPA, CHEK2, CTNNA1, DICER1, DIS3L2, EGFR (c.2369C>T, p.Thr790Met variant only), EPCAM (Deletion/duplication testing only), FH, FLCN, GATA2, GPC3, GREM1 (Promoter region deletion/duplication testing only), HOXB13 (c.251G>A, p.Gly84Glu), HRAS, KIT, MAX, MEN1, MET, MITF (c.952G>A, p.Glu318Lys variant only), MLH1, MSH2, MSH3, MSH6, MUTYH, NBN, NF1, NF2, NTHL1, PALB2, PDGFRA, PHOX2B, PMS2, POLD1, POLE, POT1, PRKAR1A, PTCH1, PTEN, RAD50, RAD51C, RAD51D, RB1, RECQL4, RET, RNF43, RUNX1, SDHAF2, SDHA (sequence changes only), SDHB, SDHC, SDHD, SMAD4, SMARCA4, SMARCB1, SMARCE1, STK11, SUFU, TERC, TERT, TMEM127, TP53, TSC1, TSC2, VHL, WRN and WT1.    04/19/2020 Surgery   RIGHT BREAST LUMPECTOMY WITH RADIOACTIVE SEED AND SENTINEL LYMPH NODE BIOPSY by Dr Carolynne Edouard    04/19/2020 Pathology Results   FINAL MICROSCOPIC DIAGNOSIS:   A. BREAST, RIGHT, LUMPECTOMY:  -  Invasive ductal carcinoma, Nottingham grade 2 of 3, 2.2 cm  -  Ductal carcinoma in-situ  -  Calcifications associated with carcinoma  -  Margins uninvolved by carcinoma (< 0.1 cm; posterior margin)  -  Previous biopsy site changes present  -  See oncology table and comment below   B. LYMPH NODE, RIGHT AXILLARY #1, SENTINEL, BIOPSY:  -  No carcinoma identified in one lymph node (0/1)  -  See comment   C. LYMPH NODE, RIGHT AXILLARY, SENTINEL, BIOPSY:  -  No carcinoma identified in one lymph node (0/1)  -  See comment   D. LYMPH NODE, RIGHT AXILLARY #2, SENTINEL, BIOPSY:  -  No carcinoma identified in one lymph node (0/1)  -  See comment   COMMENT:   A.  The previous biopsy and E-cadherin immunohistochemistry  ([id number]) was reviewed in conjunction  with this case and supports  the above interpretation.   B-D. Given the lobular-like morphology, cytokeratin AE1/3 was performed  on the sentinel lymph nodes to exclude micrometastasis.  There is no  evidence of metastatic carcinoma by immunohistochemistry.    05/28/2020 - 06/22/2020 Radiation Therapy   Adjuvant Radiation with Dr Mitzi Hansen    09/26/2020 Survivorship   SCP delivered by Santiago Glad, NP       CURRENT THERAPY: -Antiestrogen therapy, started 02/2020             -currently exemestane since 05/2021  INTERVAL HISTORY Ms. Napierkowski returns for follow up as scheduled. Last seen by me 07/08/23  ROS   Past Medical History:  Diagnosis Date  . Anxiety   . Cancer (HCC)    skin  . Depression   . Family history of adverse reaction to anesthesia    brother had severe agitation coming out of anesthesia, had to be re-intubated  . Family history of breast cancer   . Family history of colon cancer   . Family history of leukemia   . Family history of ovarian cancer   . Family history of prostate cancer   . Family history of skin cancer   .  Heart murmur    "slight"  . Hypothyroidism   . Optic neuritis   . Personal history of radiation therapy   . [place] spotted fever    1970's     Past Surgical History:  Procedure Laterality Date  . BREAST BIOPSY Right 02/01/2020  . BREAST LUMPECTOMY Right 04/19/2020  . BREAST LUMPECTOMY WITH RADIOACTIVE SEED AND SENTINEL LYMPH NODE BIOPSY Right 04/19/2020   Procedure: RIGHT BREAST LUMPECTOMY WITH RADIOACTIVE SEED AND SENTINEL LYMPH NODE BIOPSY;  Surgeon: Griselda Miner, MD;  Location: [facility] OR;  Service: General;  Laterality: Right;  PEC BLOCK  . DENTAL SURGERY    . MELANOMA EXCISION  2006   Right forearm  . SENTINEL NODE BIOPSY Right 2006  . TONSILLECTOMY    . TONSILLECTOMY       Outpatient Encounter Medications as of 01/04/2024  Medication Sig  . Calcium-Vitamin D-Vitamin K 500-100-40 MG-UNT-MCG CHEW Chew 2 each by mouth daily.  .  cetirizine (ZYRTEC) 5 MG tablet Take 5 mg by mouth daily as needed for allergies.  . citalopram (CELEXA) 20 MG tablet TAKE 1 TABLET BY MOUTH EVERY DAY  . diazepam (VALIUM) 5 MG tablet Take 0.5-1 tablets (2.5-5 mg total) by mouth daily as needed for anxiety.  Marland Kitchen doxycycline (VIBRA-TABS) 100 MG tablet Take 1 tablet (100 mg total) by mouth 2 (two) times daily.  Marland Kitchen exemestane (AROMASIN) 25 MG tablet TAKE 1 TABLET BY MOUTH DAILY AFTER BREAKFAST.  Marland Kitchen GARLIC-LECITHIN PO Take 2 tablets by mouth daily.  . predniSONE (DELTASONE) 10 MG tablet 2 tabs by mouth per day for 5 days  . QUERCETIN PO Take 2 tablets by mouth daily.  . TURMERIC PO Take 2 tablets by mouth daily.  . valACYclovir (VALTREX) 1000 MG tablet TAKE 1 TABLET ONCE DAILY FOR 5 DAYS AS NEEDED, START ASAP WITHIN 48 HOURS OF ONSET OF SYMPTOMS   No facility-administered encounter medications on file as of 01/04/2024.     There were no vitals filed for this visit. There is no height or weight on file to calculate BMI.   PHYSICAL EXAM GENERAL:alert, no distress and comfortable SKIN: no rash  EYES: sclera clear NECK: without mass LYMPH:  no palpable cervical or supraclavicular lymphadenopathy  LUNGS: clear with normal breathing effort HEART: regular rate & rhythm, no lower extremity edema ABDOMEN: abdomen soft, non-tender and normal bowel sounds NEURO: alert & oriented x 3 with fluent speech, no focal motor/sensory deficits Breast exam:  PAC without erythema    CBC    Component Value Date/Time   WBC 9.2 07/08/2023 0828   RBC 4.35 07/08/2023 0828   HGB 13.9 07/08/2023 0828   HGB 13.7 07/01/2022 0921   HCT 40.1 07/08/2023 0828   PLT 294 07/08/2023 0828   PLT 272 07/01/2022 0921   MCV 92.2 07/08/2023 0828   MCV 88.9 07/12/2016 1423   MCH 32.0 07/08/2023 0828   MCHC 34.7 07/08/2023 0828   RDW 12.6 07/08/2023 0828   LYMPHSABS 2.3 07/08/2023 0828   MONOABS 0.5 07/08/2023 0828   EOSABS 0.2 07/08/2023 0828   BASOSABS 0.1 07/08/2023 0828      CMP     Component Value Date/Time   NA 139 07/08/2023 0828   K 4.0 07/08/2023 0828   CL 106 07/08/2023 0828   CO2 25 07/08/2023 0828   GLUCOSE 102 (H) 07/08/2023 0828   BUN 9 07/08/2023 0828   CREATININE 0.75 07/08/2023 0828   CREATININE 0.79 07/01/2022 0921   CREATININE 0.68 09/01/2014 1057  CALCIUM 9.4 07/08/2023 0828   PROT 7.1 07/08/2023 0828   ALBUMIN 4.4 07/08/2023 0828   AST 17 07/08/2023 0828   AST 15 07/01/2022 0921   ALT 14 07/08/2023 0828   ALT 11 07/01/2022 0921   ALKPHOS 78 07/08/2023 0828   BILITOT 0.7 07/08/2023 0828   BILITOT 0.5 07/01/2022 0921   GFRNONAA >60 07/08/2023 0828   GFRNONAA >60 07/01/2022 0921   GFRAA >60 04/13/2020 0949   GFRAA >60 02/08/2020 1218     ASSESSMENT & PLAN:  PLAN:  No orders of the defined types were placed in this encounter.     All questions were answered. The patient knows to call the clinic with any problems, questions or concerns. No barriers to learning were detected. I spent *** counseling the patient face to face. The total time spent in the appointment was *** and more than 50% was on counseling, review of test results, and coordination of care.   Santiago Glad, NP-C @DATE @

## 2024-01-04 ENCOUNTER — Encounter: Payer: Self-pay | Admitting: Nurse Practitioner

## 2024-01-04 ENCOUNTER — Inpatient Hospital Stay (HOSPITAL_BASED_OUTPATIENT_CLINIC_OR_DEPARTMENT_OTHER): Payer: Self-pay | Admitting: Nurse Practitioner

## 2024-01-04 ENCOUNTER — Inpatient Hospital Stay: Payer: Self-pay | Attending: Nurse Practitioner

## 2024-01-04 ENCOUNTER — Other Ambulatory Visit: Payer: Self-pay

## 2024-01-04 VITALS — BP 138/74 | HR 64 | Temp 97.3°F | Resp 17 | Wt 159.7 lb

## 2024-01-04 DIAGNOSIS — M8580 Other specified disorders of bone density and structure, unspecified site: Secondary | ICD-10-CM | POA: Diagnosis not present

## 2024-01-04 DIAGNOSIS — Z1732 Human epidermal growth factor receptor 2 negative status: Secondary | ICD-10-CM | POA: Insufficient documentation

## 2024-01-04 DIAGNOSIS — E2839 Other primary ovarian failure: Secondary | ICD-10-CM | POA: Diagnosis not present

## 2024-01-04 DIAGNOSIS — M85852 Other specified disorders of bone density and structure, left thigh: Secondary | ICD-10-CM | POA: Insufficient documentation

## 2024-01-04 DIAGNOSIS — Z170 Estrogen receptor positive status [ER+]: Secondary | ICD-10-CM

## 2024-01-04 DIAGNOSIS — Z806 Family history of leukemia: Secondary | ICD-10-CM | POA: Insufficient documentation

## 2024-01-04 DIAGNOSIS — C50511 Malignant neoplasm of lower-outer quadrant of right female breast: Secondary | ICD-10-CM | POA: Diagnosis present

## 2024-01-04 DIAGNOSIS — Z8042 Family history of malignant neoplasm of prostate: Secondary | ICD-10-CM | POA: Insufficient documentation

## 2024-01-04 DIAGNOSIS — Z79811 Long term (current) use of aromatase inhibitors: Secondary | ICD-10-CM | POA: Diagnosis not present

## 2024-01-04 DIAGNOSIS — R0609 Other forms of dyspnea: Secondary | ICD-10-CM | POA: Insufficient documentation

## 2024-01-04 DIAGNOSIS — Z79899 Other long term (current) drug therapy: Secondary | ICD-10-CM | POA: Insufficient documentation

## 2024-01-04 DIAGNOSIS — Z9089 Acquired absence of other organs: Secondary | ICD-10-CM | POA: Insufficient documentation

## 2024-01-04 DIAGNOSIS — Z8616 Personal history of COVID-19: Secondary | ICD-10-CM | POA: Diagnosis not present

## 2024-01-04 DIAGNOSIS — Z1721 Progesterone receptor positive status: Secondary | ICD-10-CM | POA: Insufficient documentation

## 2024-01-04 DIAGNOSIS — Z808 Family history of malignant neoplasm of other organs or systems: Secondary | ICD-10-CM | POA: Insufficient documentation

## 2024-01-04 DIAGNOSIS — Z8041 Family history of malignant neoplasm of ovary: Secondary | ICD-10-CM | POA: Diagnosis not present

## 2024-01-04 DIAGNOSIS — Z923 Personal history of irradiation: Secondary | ICD-10-CM | POA: Diagnosis not present

## 2024-01-04 DIAGNOSIS — E538 Deficiency of other specified B group vitamins: Secondary | ICD-10-CM | POA: Diagnosis not present

## 2024-01-04 DIAGNOSIS — Z800 Family history of malignant neoplasm of digestive organs: Secondary | ICD-10-CM | POA: Diagnosis not present

## 2024-01-04 DIAGNOSIS — N6002 Solitary cyst of left breast: Secondary | ICD-10-CM | POA: Diagnosis not present

## 2024-01-04 DIAGNOSIS — Z85820 Personal history of malignant melanoma of skin: Secondary | ICD-10-CM | POA: Insufficient documentation

## 2024-01-04 DIAGNOSIS — F419 Anxiety disorder, unspecified: Secondary | ICD-10-CM | POA: Insufficient documentation

## 2024-01-04 LAB — CBC WITH DIFFERENTIAL (CANCER CENTER ONLY)
Abs Immature Granulocytes: 0.02 10*3/uL (ref 0.00–0.07)
Basophils Absolute: 0.1 10*3/uL (ref 0.0–0.1)
Basophils Relative: 1 %
Eosinophils Absolute: 0.3 10*3/uL (ref 0.0–0.5)
Eosinophils Relative: 4 %
HCT: 40.9 % (ref 36.0–46.0)
Hemoglobin: 13.6 g/dL (ref 12.0–15.0)
Immature Granulocytes: 0 %
Lymphocytes Relative: 37 %
Lymphs Abs: 3 10*3/uL (ref 0.7–4.0)
MCH: 31.3 pg (ref 26.0–34.0)
MCHC: 33.3 g/dL (ref 30.0–36.0)
MCV: 94 fL (ref 80.0–100.0)
Monocytes Absolute: 0.5 10*3/uL (ref 0.1–1.0)
Monocytes Relative: 6 %
Neutro Abs: 4.3 10*3/uL (ref 1.7–7.7)
Neutrophils Relative %: 52 %
Platelet Count: 308 10*3/uL (ref 150–400)
RBC: 4.35 MIL/uL (ref 3.87–5.11)
RDW: 12.7 % (ref 11.5–15.5)
WBC Count: 8.1 10*3/uL (ref 4.0–10.5)
nRBC: 0 % (ref 0.0–0.2)

## 2024-01-04 LAB — CMP (CANCER CENTER ONLY)
ALT: 17 U/L (ref 0–44)
AST: 18 U/L (ref 15–41)
Albumin: 4.8 g/dL (ref 3.5–5.0)
Alkaline Phosphatase: 87 U/L (ref 38–126)
Anion gap: 8 (ref 5–15)
BUN: 12 mg/dL (ref 8–23)
CO2: 28 mmol/L (ref 22–32)
Calcium: 9.2 mg/dL (ref 8.9–10.3)
Chloride: 105 mmol/L (ref 98–111)
Creatinine: 0.74 mg/dL (ref 0.44–1.00)
GFR, Estimated: >60 mL/min (ref >60)
Glucose, Bld: 98 mg/dL (ref 70–99)
Potassium: 4.3 mmol/L (ref 3.5–5.1)
Sodium: 141 mmol/L (ref 135–145)
Total Bilirubin: 0.5 mg/dL (ref 0.0–1.2)
Total Protein: 7.4 g/dL (ref 6.5–8.1)

## 2024-01-04 LAB — VITAMIN B12: Vitamin B-12: 1252 pg/mL — ABNORMAL HIGH (ref 180–914)

## 2024-01-06 ENCOUNTER — Encounter: Payer: Self-pay | Admitting: Nurse Practitioner

## 2024-01-06 ENCOUNTER — Ambulatory Visit: Payer: BC Managed Care – PPO | Admitting: Nurse Practitioner

## 2024-01-06 ENCOUNTER — Other Ambulatory Visit: Payer: BC Managed Care – PPO

## 2024-02-17 ENCOUNTER — Ambulatory Visit
Admission: RE | Admit: 2024-02-17 | Discharge: 2024-02-17 | Disposition: A | Discharge status: Home / Self Care | Payer: Self-pay | Source: Ambulatory Visit | Attending: Nurse Practitioner

## 2024-02-17 ENCOUNTER — Ambulatory Visit: Admission: RE | Admit: 2024-02-17 | Payer: Self-pay | Source: Ambulatory Visit

## 2024-02-17 DIAGNOSIS — Z170 Estrogen receptor positive status [ER+]: Secondary | ICD-10-CM

## 2024-03-07 ENCOUNTER — Ambulatory Visit: Payer: BC Managed Care – PPO | Attending: General Surgery

## 2024-03-07 DIAGNOSIS — Z483 Aftercare following surgery for neoplasm: Secondary | ICD-10-CM | POA: Insufficient documentation

## 2024-04-13 ENCOUNTER — Telehealth: Payer: Self-pay | Admitting: Family Medicine

## 2024-04-13 DIAGNOSIS — A6000 Herpesviral infection of urogenital system, unspecified: Secondary | ICD-10-CM

## 2024-04-13 MED ORDER — VALACYCLOVIR HCL 1 G PO TABS: ORAL_TABLET | ORAL | 1 refills | Status: DC

## 2024-04-13 NOTE — Telephone Encounter (Signed)
 Copied from CRM [id number]. Topic: Clinical - Medication Refill >> Apr 13, 2024 11:37 AM Alyse July wrote: Medication: valACYclovir  (VALTREX ) 1000 MG tablet   Has the patient contacted their pharmacy? Yes (Agent: If no, request that the patient contact the pharmacy for the refill. If patient does not wish to contact the pharmacy document the reason why and proceed with request.) (Agent: If yes, when and what did the pharmacy advise?)  This is the patient's preferred pharmacy:  [id number] - [place], [place] - [address] AT [address] OF [facility] [address] [place] [address] Phone: [phone] Fax: [phone]  Is this the correct pharmacy for this prescription? Yes If no, delete pharmacy and type the correct one.   Has the prescription been filled recently? No  Is the patient out of the medication? No  Has the patient been seen for an appointment in the last year OR does the patient have an upcoming appointment? Yes  Can we respond through MyChart? Yes  Agent: Please be advised that Rx refills may take up to 3 business days. We ask that you follow-up with your pharmacy.

## 2024-05-13 ENCOUNTER — Encounter: Payer: Self-pay | Admitting: Family Medicine

## 2024-05-13 ENCOUNTER — Ambulatory Visit: Payer: Self-pay | Admitting: Family Medicine

## 2024-05-13 ENCOUNTER — Ambulatory Visit (INDEPENDENT_AMBULATORY_CARE_PROVIDER_SITE_OTHER): Payer: BC Managed Care – PPO | Admitting: Family Medicine

## 2024-05-13 VITALS — BP 110/70 | HR 82 | Temp 98.9°F | Resp 16 | Ht 66.0 in | Wt 162.4 lb

## 2024-05-13 DIAGNOSIS — F419 Anxiety disorder, unspecified: Secondary | ICD-10-CM | POA: Diagnosis not present

## 2024-05-13 DIAGNOSIS — Z13228 Encounter for screening for other metabolic disorders: Secondary | ICD-10-CM

## 2024-05-13 DIAGNOSIS — E785 Hyperlipidemia, unspecified: Secondary | ICD-10-CM | POA: Diagnosis not present

## 2024-05-13 DIAGNOSIS — A6000 Herpesviral infection of urogenital system, unspecified: Secondary | ICD-10-CM

## 2024-05-13 DIAGNOSIS — Z130 Encounter for screening for diseases of the blood and blood-forming organs and certain disorders involving the immune mechanism: Secondary | ICD-10-CM

## 2024-05-13 DIAGNOSIS — Z0000 Encounter for general adult medical examination without abnormal findings: Secondary | ICD-10-CM | POA: Diagnosis not present

## 2024-05-13 DIAGNOSIS — E041 Nontoxic single thyroid nodule: Secondary | ICD-10-CM

## 2024-05-13 DIAGNOSIS — Z1211 Encounter for screening for malignant neoplasm of colon: Secondary | ICD-10-CM

## 2024-05-13 DIAGNOSIS — Z1329 Encounter for screening for other suspected endocrine disorder: Secondary | ICD-10-CM | POA: Diagnosis not present

## 2024-05-13 LAB — LIPID PANEL
Cholesterol: 236 mg/dL — ABNORMAL HIGH (ref 0–200)
HDL: 49.2 mg/dL (ref >39.00)
LDL Cholesterol: 168 mg/dL — ABNORMAL HIGH (ref 0–99)
NonHDL: 186.63
Total CHOL/HDL Ratio: 5
Triglycerides: 91 mg/dL (ref 0.0–149.0)
VLDL: 18.2 mg/dL (ref 0.0–40.0)

## 2024-05-13 LAB — TSH: TSH: 2.95 u[IU]/mL (ref 0.35–5.50)

## 2024-05-13 LAB — HEMOGLOBIN A1C: Hgb A1c MFr Bld: 6 % (ref 4.6–6.5)

## 2024-05-13 MED ORDER — VALACYCLOVIR HCL 1 G PO TABS: ORAL_TABLET | ORAL | 1 refills | Status: DC

## 2024-05-13 MED ORDER — CITALOPRAM HYDROBROMIDE 20 MG PO TABS
20.0000 mg | ORAL_TABLET | Freq: Every day | ORAL | 3 refills | Status: AC
Start: 2024-05-13 — End: ?

## 2024-05-13 MED ORDER — DIAZEPAM 5 MG PO TABS: 2.5000 mg | ORAL_TABLET | Freq: Every day | ORAL | 1 refills | Status: AC | PRN

## 2024-05-13 NOTE — Assessment & Plan Note (Signed)
 We discussed the importance of regular physical activity and healthy diet for prevention of chronic illness and/or complications. Preventive guidelines reviewed. Vaccination: Declined vaccines. Due for cervical cancer screening 04/2025. Due for colon cancer screening 09/2024, would like cologuard instead colonoscopy. Ca++ and vit D supplementation to continue. Next CPE in a year.

## 2024-05-13 NOTE — Assessment & Plan Note (Signed)
 She has declined pharmacologic treatment.

## 2024-05-13 NOTE — Assessment & Plan Note (Addendum)
 Thyroid  US  done on 01/06/2020 showed showed 1.4 cm right superior TR 2 nodule does not meet criteria for biopsy or follow-up Last TSH 3.3 in 04/2023.

## 2024-05-13 NOTE — Patient Instructions (Addendum)
 A few things to remember from today's visit:  Routine general medical examination at a health care facility  Hyperlipidemia LDL goal <70 - Plan: Lipid panel  Screening for endocrine, metabolic and immunity disorder - Plan: Hemoglobin A1c  Anxiety disorder, unspecified type - Plan: citalopram  (CELEXA ) 20 MG tablet, diazepam  (VALIUM ) 5 MG tablet  Colon cancer screening - Due after 10/02/2024. - Plan: Cologuard  Recurrent genital herpes - Plan: valACYclovir  (VALTREX ) 1000 MG tablet  Thyroid  nodule - Plan: TSH  Continue Valtrex  for 4 more months then as needed for flare ups.  If you need refills for medications you take chronically, please call your pharmacy. Do not use My Chart to request refills or for acute issues that need immediate attention. If you send a my chart message, it may take a few days to be addressed, specially if I am not in the office.  Please be sure medication list is accurate. If a new problem present, please set up appointment sooner than planned today.  Health Maintenance, Female Adopting a healthy lifestyle and getting preventive care are important in promoting health and wellness. Ask your health care provider about: The right schedule for you to have regular tests and exams. Things you can do on your own to prevent diseases and keep yourself healthy. What should I know about diet, weight, and exercise? Eat a healthy diet  Eat a diet that includes plenty of vegetables, fruits, low-fat dairy products, and lean protein. Do not eat a lot of foods that are high in solid fats, added sugars, or sodium. Maintain a healthy weight Body mass index (BMI) is used to identify weight problems. It estimates body fat based on height and weight. Your health care provider can help determine your BMI and help you achieve or maintain a healthy weight. Get regular exercise Get regular exercise. This is one of the most important things you can do for your health. Most adults  should: Exercise for at least 150 minutes each week. The exercise should increase your heart rate and make you sweat (moderate-intensity exercise). Do strengthening exercises at least twice a week. This is in addition to the moderate-intensity exercise. Spend less time sitting. Even light physical activity can be beneficial. Watch cholesterol and blood lipids Have your blood tested for lipids and cholesterol at 64 years of age, then have this test every 5 years. Have your cholesterol levels checked more often if: Your lipid or cholesterol levels are high. You are older than 64 years of age. You are at high risk for heart disease. What should I know about cancer screening? Depending on your health history and family history, you may need to have cancer screening at various ages. This may include screening for: Breast cancer. Cervical cancer. Colorectal cancer. Skin cancer. Lung cancer. What should I know about heart disease, diabetes, and high blood pressure? Blood pressure and heart disease High blood pressure causes heart disease and increases the risk of stroke. This is more likely to develop in people who have high blood pressure readings or are overweight. Have your blood pressure checked: Every 3-5 years if you are 59-32 years of age. Every year if you are 19 years old or older. Diabetes Have regular diabetes screenings. This checks your fasting blood sugar level. Have the screening done: Once every three years after age 38 if you are at a normal weight and have a low risk for diabetes. More often and at a younger age if you are overweight or have a high  risk for diabetes. What should I know about preventing infection? Hepatitis B If you have a higher risk for hepatitis B, you should be screened for this virus. Talk with your health care provider to find out if you are at risk for hepatitis B infection. Hepatitis C Testing is recommended for: Everyone born from [birth year] through  1966. Anyone with known risk factors for hepatitis C. Sexually transmitted infections (STIs) Get screened for STIs, including gonorrhea and chlamydia, if: You are sexually active and are younger than 64 years of age. You are older than 64 years of age and your health care provider tells you that you are at risk for this type of infection. Your sexual activity has changed since you were last screened, and you are at increased risk for chlamydia or gonorrhea. Ask your health care provider if you are at risk. Ask your health care provider about whether you are at high risk for HIV. Your health care provider may recommend a prescription medicine to help prevent HIV infection. If you choose to take medicine to prevent HIV, you should first get tested for HIV. You should then be tested every 3 months for as long as you are taking the medicine. Pregnancy If you are about to stop having your period (premenopausal) and you may become pregnant, seek counseling before you get pregnant. Take 400 to 800 micrograms (mcg) of folic acid every day if you become pregnant. Ask for birth control (contraception) if you want to prevent pregnancy. Osteoporosis and menopause Osteoporosis is a disease in which the bones lose minerals and strength with aging. This can result in bone fractures. If you are 76 years old or older, or if you are at risk for osteoporosis and fractures, ask your health care provider if you should: Be screened for bone loss. Take a calcium or vitamin D  supplement to lower your risk of fractures. Be given hormone replacement therapy (HRT) to treat symptoms of menopause. Follow these instructions at home: Alcohol use Do not drink alcohol if: Your health care provider tells you not to drink. You are pregnant, may be pregnant, or are planning to become pregnant. If you drink alcohol: Limit how much you have to: 0-1 drink a day. Know how much alcohol is in your drink. In the [place] one drink  equals one 12 oz bottle of beer (355 mL), one 5 oz glass of wine (148 mL), or one 1 oz glass of hard liquor (44 mL). Lifestyle Do not use any products that contain nicotine or tobacco. These products include cigarettes, chewing tobacco, and vaping devices, such as e-cigarettes. If you need help quitting, ask your health care provider. Do not use street drugs. Do not share needles. Ask your health care provider for help if you need support or information about quitting drugs. General instructions Schedule regular health, dental, and eye exams. Stay current with your vaccines. Tell your health care provider if: You often feel depressed. You have ever been abused or do not feel safe at home. Summary Adopting a healthy lifestyle and getting preventive care are important in promoting health and wellness. Follow your health care provider's instructions about healthy diet, exercising, and getting tested or screened for diseases. Follow your health care provider's instructions on monitoring your cholesterol and blood pressure. This information is not intended to replace advice given to you by your health care provider. Make sure you discuss any questions you have with your health care provider. Document Revised: 03/04/2021 Document Reviewed: 03/04/2021 Elsevier Patient Education  2024 [employer]

## 2024-05-13 NOTE — Assessment & Plan Note (Addendum)
 Problem has been stable. Continue Celexa  20 mg daily and diazepam  5 mg 1/2 to 1 tablet daily as needed.  As well as she does not need refills and problem remains stable, we can continue following annually. PDMP reviewed.

## 2024-05-13 NOTE — Progress Notes (Signed)
 HPI: Ms.Lindsey L Skarda is a 63 y.o. female with PMHx significant for recurrent genital herpes,PAD,osteopenia,knee OA, right breast cancer s/p lumpectomy and radiation,B12 def,and fatigue, who is here today for her routine physical and follow up.  Last CPE: 05/13/2023 Since then she as seen by Dr. Ann of oncology on 07/08/23 and Dr. Anner of cardiology on 9/18 for dyspnea on exertion. She still experiences symptoms intermittently.    Exercise: She has not been exercising.  Diet: Eating home cooked meals. Eating vegetables daily.  Sleep: 8-9 hours.  Smoking: Never.  Alcohol consumption: 1 glass of wine daily. Dental: UTD with routine dental care.  Vision: Last seen 2 years ago.   Health Maintenance  Topic Date Due   HIV Screening  Never done   COVID-19 Vaccine (4 - 2024-25 season) 05/29/2024*   Zoster (Shingles) Vaccine (1 of 2) 08/13/2024*   DTaP/Tdap/Td vaccine (1 - Tdap) 05/13/2025*   Flu Shot  05/27/2024   Colon Cancer Screening  10/02/2024   Pap with HPV screening  05/04/2025   Mammogram  02/16/2026   Hepatitis C Screening  Completed   Hepatitis B Vaccine  Aged Out   HPV Vaccine  Aged Out   Meningitis B Vaccine  Aged Out  *Topic was postponed. The date shown is not the original due date.   Immunization History  Administered Date(s) Administered   PFIZER Comirnaty(Gray Top)Covid-19 Tri-Sucrose Vaccine 01/04/2021   PFIZER(Purple Top)SARS-COV-2 Vaccination 05/19/2020, 06/09/2020   Chronic medical problems:  Anxiety Moods are stable. Pt is on Celexa  20 mg daily and Diazepam  5 mg 1/2-1 tab PRN.  She has been on different medications intermittently in the past, exacerbated by Dx of breast cancer a few years ago. Denies any SI/HI.     05/13/2024    7:23 AM 05/13/2023    7:58 AM 09/24/2022    7:56 AM 03/10/2022    7:18 AM 07/12/2016    1:56 PM  Depression screen PHQ 2/9  Decreased Interest 0 0 0 0 0  Down, Depressed, Hopeless 0 0 0 0 0  PHQ - 2 Score 0 0 0 0 0   Altered sleeping  1     Tired, decreased energy  2     Change in appetite  0     Feeling bad or failure about yourself   0     Trouble concentrating  0     Moving slowly or fidgety/restless  0     Suicidal thoughts  0     PHQ-9 Score  3     Difficult doing work/chores  Not difficult at all         05/13/2023    7:58 AM  GAD 7 : Generalized Anxiety Score  Nervous, Anxious, on Edge 0  Control/stop worrying 0  Worry too much - different things 0  Trouble relaxing 0  Restless 0  Easily annoyed or irritable 0  Afraid - awful might happen 0  Total GAD 7 Score 0  Anxiety Difficulty Not difficult at all   HLD and PAD Not currently on any meds, statin has been recommended in the past..  She reports drinking a herbal tea that it meant to help with  ABI 05/2020 normal except for the right  toe-brachial index is abnormal . Negative for claudication like symptoms.  Lab Results  Component Value Date   CHOL 287 (H) 05/13/2023   HDL 64.20 05/13/2023   LDLCALC 205 (H) 05/13/2023   TRIG 90.0 05/13/2023   CHOLHDL 4  05/13/2023   Lab Results  Component Value Date   ALT 17 01/04/2024   AST 18 01/04/2024   ALKPHOS 87 01/04/2024   BILITOT 0.5 01/04/2024   Lab Results  Component Value Date   NA 141 01/04/2024   CL 105 01/04/2024   K 4.3 01/04/2024   CO2 28 01/04/2024   BUN 12 01/04/2024   CREATININE 0.74 01/04/2024   GFRNONAA >60 01/04/2024   CALCIUM 9.2 01/04/2024   ALBUMIN 4.8 01/04/2024   GLUCOSE 98 01/04/2024   Hx of thyroid  nodule She had a Thyroid  ultrasound done on 01/06/2020.  Results showed 1.4 cm right superior TR 2 nodule does not meet criteria for biopsy or follow-up  Lab Results  Component Value Date   TSH 3.34 05/13/2023   Genital herpes: She has been taking Valtrex  1000 mg daily for the past few weeks due to frequent episodes. She needs a new Rx.  She has no acute concerns today.  Review of Systems  Constitutional:  Positive for fatigue. Negative for  activity change, appetite change, chills and fever.  HENT:  Negative for mouth sores, sore throat and trouble swallowing.   Eyes:  Negative for redness and visual disturbance.  Respiratory:  Positive for shortness of breath (chronic and stable). Negative for cough and wheezing.   Cardiovascular:  Negative for chest pain and leg swelling.  Gastrointestinal:  Negative for abdominal pain, nausea and vomiting.  Endocrine: Negative for cold intolerance, heat intolerance, polydipsia, polyphagia and polyuria.  Genitourinary:  Negative for decreased urine volume, dysuria, hematuria, vaginal bleeding and vaginal discharge.  Musculoskeletal:  Negative for gait problem and myalgias.  Skin:  Positive for rash (Improvig, right forearm due to poison ivy exposure). Negative for color change.  Allergic/Immunologic: Positive for environmental allergies.  Neurological:  Negative for syncope, weakness and headaches.  Hematological:  Negative for adenopathy. Does not bruise/bleed easily.  Psychiatric/Behavioral:  Negative for confusion and hallucinations. The patient is nervous/anxious.   All other systems reviewed and are negative.  Current Outpatient Medications on File Prior to Visit  Medication Sig Dispense Refill   Calcium-Vitamin D -Vitamin K 500-100-40 MG-UNT-MCG CHEW Chew 2 each by mouth daily.     cetirizine  (ZYRTEC ) 5 MG tablet Take 5 mg by mouth daily as needed for allergies.     exemestane  (AROMASIN ) 25 MG tablet TAKE 1 TABLET BY MOUTH DAILY AFTER BREAKFAST. 90 tablet 3   GARLIC-LECITHIN PO Take 2 tablets by mouth daily.     QUERCETIN PO Take 2 tablets by mouth daily.     TURMERIC PO Take 2 tablets by mouth daily.     No current facility-administered medications on file prior to visit.    Past Medical History:  Diagnosis Date   Anxiety    Cancer (HCC)    skin   Depression    Family history of adverse reaction to anesthesia    brother had severe agitation coming out of anesthesia, had to be  re-intubated   Family history of breast cancer    Family history of colon cancer    Family history of leukemia    Family history of ovarian cancer    Family history of prostate cancer    Family history of skin cancer    Heart murmur    slight   Hypothyroidism    Optic neuritis    Personal history of radiation therapy    [place] spotted fever    1970's    Past Surgical History:  Procedure Laterality Date  BREAST BIOPSY Right 02/01/2020   BREAST LUMPECTOMY Right 04/19/2020   BREAST LUMPECTOMY WITH RADIOACTIVE SEED AND SENTINEL LYMPH NODE BIOPSY Right 04/19/2020   Procedure: RIGHT BREAST LUMPECTOMY WITH RADIOACTIVE SEED AND SENTINEL LYMPH NODE BIOPSY;  Surgeon: Curvin Deward MOULD, MD;  Location: [facility] OR;  Service: General;  Laterality: Right;  PEC BLOCK   DENTAL SURGERY     MELANOMA EXCISION  2006   Right forearm   SENTINEL NODE BIOPSY Right 2006   TONSILLECTOMY     TONSILLECTOMY      Allergies  Allergen Reactions   Latex Itching   Oxycodone Other (See Comments)    Other Reaction: too strong a drug for me    Family History  Problem Relation Age of Onset   Colon polyps Mother        more than 46   Colitis Mother    Arthritis Mother    Hyperlipidemia Mother    Diabetes Mother    Skin cancer Mother    Heart disease Father    Hyperlipidemia Father    Hypertension Father    Colon cancer Paternal Aunt        dx. in her 17s   Diabetes Maternal Grandmother    Breast cancer Maternal Grandmother        dx. >50   Breast cancer Other 67       maternal great-aunt   Ovarian cancer Other 53   Colon cancer Other 61   Skin cancer Other 53   Prostate cancer Half-Brother 51   Leukemia Maternal Great-grandmother 84       chronic    Social History   Socioeconomic History   Marital status: Married    Spouse name: Not on file   Number of children: 1   Years of education: Not on file   Highest education level: Not on file  Occupational History   Not on file   Tobacco Use   Smoking status: Never   Smokeless tobacco: Never  Vaping Use   Vaping status: Never Used  Substance and Sexual Activity   Alcohol use: Yes    Alcohol/week: 7.0 standard drinks of alcohol    Types: 7 Glasses of wine per week   Drug use: No   Sexual activity: Not on file  Other Topics Concern   Not on file  Social History Narrative   Not on file   Social Drivers of Health   Financial Resource Strain: Not on file  Food Insecurity: Not on file  Transportation Needs: Not on file  Physical Activity: Not on file  Stress: Not on file  Social Connections: Not on file   Vitals:   05/13/24 0723  BP: 110/70  Pulse: 82  Resp: 16  Temp: 98.9 F (37.2 C)  SpO2: 99%   Body mass index is 26.21 kg/m.  Wt Readings from Last 3 Encounters:  05/13/24 162 lb 6 oz (73.7 kg)  01/04/24 159 lb 11.2 oz (72.4 kg)  09/07/23 159 lb 6 oz (72.3 kg)   Physical Exam Vitals and nursing note reviewed.  Constitutional:      General: She is not in acute distress.    Appearance: She is well-developed.  HENT:     Head: Normocephalic and atraumatic.     Right Ear: Tympanic membrane, ear canal and external ear normal.     Left Ear: Tympanic membrane, ear canal and external ear normal.     Mouth/Throat:     Mouth: Mucous membranes are moist.  Pharynx: Oropharynx is clear. Uvula midline.  Eyes:     Extraocular Movements: Extraocular movements intact.     Conjunctiva/sclera: Conjunctivae normal.     Pupils: Pupils are equal, round, and reactive to light.  Neck:     Thyroid : Thyromegaly (R>L) present. No thyroid  mass.     Trachea: No tracheal deviation.  Cardiovascular:     Rate and Rhythm: Normal rate and regular rhythm.     Pulses:          Dorsalis pedis pulses are 2+ on the right side and 2+ on the left side.     Heart sounds: No murmur heard. Pulmonary:     Effort: Pulmonary effort is normal. No respiratory distress.     Breath sounds: Normal breath sounds.  Abdominal:      Palpations: Abdomen is soft. There is no hepatomegaly or mass.     Tenderness: There is no abdominal tenderness.  Genitourinary:    Comments: No concerns today. Musculoskeletal:     Right lower leg: No edema.     Left lower leg: No edema.     Comments: No signs of synovitis appreciated.  Lymphadenopathy:     Cervical: No cervical adenopathy.     Upper Body:     Right upper body: No supraclavicular adenopathy.     Left upper body: No supraclavicular adenopathy.  Skin:    General: Skin is warm.     Findings: Rash (minimal scattered on volar aspect of right forearm) present. No erythema.  Neurological:     General: No focal deficit present.     Mental Status: She is alert and oriented to person, place, and time.     Cranial Nerves: No cranial nerve deficit.     Coordination: Coordination normal.     Gait: Gait normal.     Deep Tendon Reflexes:     Reflex Scores:      Bicep reflexes are 2+ on the right side and 2+ on the left side.      Patellar reflexes are 2+ on the right side and 2+ on the left side. Psychiatric:        Mood and Affect: Mood and affect normal.    ASSESSMENT AND PLAN: Ms. RESHANDA LEWEY was here today annual physical examination.  Orders Placed This Encounter  Procedures   Cologuard   Hemoglobin A1c   Lipid panel   TSH   Lab Results  Component Value Date   TSH 2.95 05/13/2024   Lab Results  Component Value Date   CHOL 236 (H) 05/13/2024   HDL 49.20 05/13/2024   LDLCALC 168 (H) 05/13/2024   TRIG 91.0 05/13/2024   CHOLHDL 5 05/13/2024   Lab Results  Component Value Date   HGBA1C 6.0 05/13/2024   Routine general medical examination at a health care facility Assessment & Plan: We discussed the importance of regular physical activity and healthy diet for prevention of chronic illness and/or complications. Preventive guidelines reviewed. Vaccination: Declined vaccines. Due for cervical cancer screening 04/2025. Due for colon cancer screening  09/2024, would like cologuard instead colonoscopy. Ca++ and vit D supplementation to continue. Next CPE in a year.   Hyperlipidemia LDL goal <70 Assessment & Plan: She has declined pharmacologic treatment.   Orders: -     Lipid panel; Future  Screening for endocrine, metabolic and immunity disorder -     Hemoglobin A1c; Future  Anxiety disorder, unspecified type Assessment & Plan: Problem has been stable. Continue Celexa  20 mg daily  and diazepam  5 mg 1/2 to 1 tablet daily as needed.  As well as she does not need refills and problem remains stable, we can continue following annually. PDMP reviewed.  Orders: -     Citalopram  Hydrobromide; Take 1 tablet (20 mg total) by mouth daily.  Dispense: 90 tablet; Refill: 3 -     diazePAM ; Take 0.5-1 tablets (2.5-5 mg total) by mouth daily as needed for anxiety.  Dispense: 20 tablet; Refill: 1  Colon cancer screening -     Cologuard  Recurrent genital herpes Assessment & Plan: Reports having more frequent episodes, so taking valacyclovir  daily for the past few weeks, recommend continue 1 tablet daily for 4 months then as needed to treat flareups.  Orders: -     valACYclovir  HCl; Daily for 4 months then back to prn: 1 TABLET ONCE DAILY FOR 5 DAYS AS NEEDED, START ASAP WITHIN 48 HOURS OF ONSET OF SYMPTOMS  Dispense: 90 tablet; Refill: 1  Thyroid  nodule Assessment & Plan: Thyroid  US  done on 01/06/2020 showed showed 1.4 cm right superior TR 2 nodule does not meet criteria for biopsy or follow-up Last TSH 3.3 in 04/2023.  Orders: -     TSH; Future  Return in 1 year (on 05/13/2025) for CPE, chronic problems, Labs.  I, Vernell Forest, acting as a scribe for Angeleigh Chiasson [place], MD., have documented all relevant documentation on the behalf of Calirose Mccance [place], MD, as directed by   while in the presence of Roque Schill [place], MD.  I, Shawnetta Lein [place], MD, have reviewed all documentation for this visit. The documentation on 05/13/24 for the exam, diagnosis,  procedures, and orders are all accurate and complete.  Becket Wecker G. [place], MD  [facility]. [place] office.

## 2024-05-13 NOTE — Assessment & Plan Note (Signed)
 Reports having more frequent episodes, so taking valacyclovir  daily for the past few weeks, recommend continue 1 tablet daily for 4 months then as needed to treat flareups.

## 2024-07-01 ENCOUNTER — Ambulatory Visit: Admitting: Family Medicine

## 2024-07-01 ENCOUNTER — Encounter: Payer: Self-pay | Admitting: Family Medicine

## 2024-07-01 VITALS — BP 110/70 | HR 66 | Temp 97.9°F | Wt 162.4 lb

## 2024-07-01 DIAGNOSIS — R102 Pelvic and perineal pain: Secondary | ICD-10-CM | POA: Diagnosis not present

## 2024-07-01 LAB — POC URINALSYSI DIPSTICK (AUTOMATED)
Bilirubin, UA: NEGATIVE
Blood, UA: NEGATIVE
Glucose, UA: NEGATIVE
Ketones, UA: NEGATIVE
Leukocytes, UA: NEGATIVE
Nitrite, UA: NEGATIVE
Protein, UA: NEGATIVE
Spec Grav, UA: 1.015 (ref 1.010–1.025)
Urobilinogen, UA: 0.2 U/dL
pH, UA: 6 (ref 5.0–8.0)

## 2024-07-01 NOTE — Progress Notes (Signed)
 Established Patient Office Visit  Subjective   Patient ID: Jean Morgan, female    DOB: 06-09-1961  Age: 63 y.o. MRN: [id number]  Chief Complaint  Patient presents with   Pelvic Pain    HPI   Jean Morgan seen today with onset Sunday of increased pelvic pressure.  No burning with urination.  No urine frequency.  Denies any vaginal spotting.  She states last weekend she was more sexually active than usual and wonders if that may have been related.  Did not recall any significant pain with intercourse.  Still has her uterus and ovaries.  Denies any appetite or weight changes.  No recent change of bowel movements but does have some occasional constipation.  No recent fevers or chills.  Colonoscopy 2015 no mention of diverticulosis.  She plans to do Cologuard this December.  Past Medical History:  Diagnosis Date   Anxiety    Cancer (HCC)    skin   Depression    Family history of adverse reaction to anesthesia    brother had severe agitation coming out of anesthesia, had to be re-intubated   Family history of breast cancer    Family history of colon cancer    Family history of leukemia    Family history of ovarian cancer    Family history of prostate cancer    Family history of skin cancer    Heart murmur    slight   Hypothyroidism    Optic neuritis    Personal history of radiation therapy    [place] spotted fever    1970's   Past Surgical History:  Procedure Laterality Date   BREAST BIOPSY Right 02/01/2020   BREAST LUMPECTOMY Right 04/19/2020   BREAST LUMPECTOMY WITH RADIOACTIVE SEED AND SENTINEL LYMPH NODE BIOPSY Right 04/19/2020   Procedure: RIGHT BREAST LUMPECTOMY WITH RADIOACTIVE SEED AND SENTINEL LYMPH NODE BIOPSY;  Surgeon: Curvin Deward MOULD, MD;  Location: [facility] OR;  Service: General;  Laterality: Right;  PEC BLOCK   DENTAL SURGERY     MELANOMA EXCISION  2006   Right forearm   SENTINEL NODE BIOPSY Right 2006   TONSILLECTOMY     TONSILLECTOMY      reports that  she has never smoked. She has never used smokeless tobacco. She reports current alcohol use of about 7.0 standard drinks of alcohol per week. She reports that she does not use drugs. family history includes Arthritis in her mother; Breast cancer in her maternal grandmother; Breast cancer (age of onset: 51) in an other family member; Colitis in her mother; Colon cancer in her paternal aunt; Colon cancer (age of onset: 27) in an other family member; Colon polyps in her mother; Diabetes in her maternal grandmother and mother; Heart disease in her father; Hyperlipidemia in her father and mother; Hypertension in her father; Leukemia (age of onset: 12) in her maternal great-grandmother; Ovarian cancer (age of onset: 62) in an other family member; Prostate cancer (age of onset: 1) in her half-brother; Skin cancer in her mother; Skin cancer (age of onset: 30) in an other family member. Allergies  Allergen Reactions   Latex Itching   Oxycodone Other (See Comments)    Other Reaction: too strong a drug for me    Review of Systems  Constitutional:  Negative for chills and fever.  Gastrointestinal:  Positive for constipation. Negative for blood in stool, diarrhea, nausea and vomiting.  Genitourinary:  Negative for dysuria, flank pain and hematuria.      Objective:  BP 110/70   Pulse 66   Temp 97.9 F (36.6 C) (Oral)   Wt 162 lb 6.4 oz (73.7 kg)   SpO2 99%   BMI 26.21 kg/m  BP Readings from Last 3 Encounters:  07/01/24 110/70  05/13/24 110/70  01/04/24 138/74   Wt Readings from Last 3 Encounters:  07/01/24 162 lb 6.4 oz (73.7 kg)  05/13/24 162 lb 6 oz (73.7 kg)  01/04/24 159 lb 11.2 oz (72.4 kg)      Physical Exam Vitals reviewed.  Constitutional:      General: She is not in acute distress.    Appearance: She is not ill-appearing.  Cardiovascular:     Rate and Rhythm: Normal rate and regular rhythm.  Pulmonary:     Effort: Pulmonary effort is normal.     Breath sounds: Normal  breath sounds.  Abdominal:     General: There is no distension.     Palpations: Abdomen is soft. There is no mass.     Tenderness: There is no guarding or rebound.     Comments: Minimal diffuse tenderness lower abdomen right and left side to deep palpation.  No guarding or rebound.  No localizing tenderness.  Neurological:     Mental Status: She is alert.      Results for orders placed or performed in visit on 07/01/24  POCT Urinalysis Dipstick (Automated)  Result Value Ref Range   Color, UA Yellow    Clarity, UA Clear    Glucose, UA Negative Negative   Bilirubin, UA Negative    Ketones, UA Negative    Spec Grav, UA 1.015 1.010 - 1.025   Blood, UA Negative    pH, UA 6.0 5.0 - 8.0   Protein, UA Negative Negative   Urobilinogen, UA 0.2 0.2 or 1.0 E.U./dL   Nitrite, UA Negative    Leukocytes, UA Negative Negative      The 10-year ASCVD risk score (Arnett DK, et al., 2019) is: 4.4%    Assessment & Plan:   Patient seen with onset about 5 days ago of some increased lower pelvic pressure sensation.  No actual dysuria or burning with urination.  Urine dipstick today is completely clear so no evidence for UTI.  She does have some intermittent constipation and symptoms possibly related to that.  Does not have any evidence of acute abdomen.  No fever or obvious infectious origin.  Symptoms relatively mild at this time.  -Recommend another week of observation but follow-up immediately for any fever, increased pain, or other new symptoms - Consider pelvic imaging with ultrasound if she has any persistent symptoms   No follow-ups on file.    Wolm Scarlet, MD

## 2024-07-01 NOTE — Patient Instructions (Signed)
 Let me or Dr [place] know if pelvic pressure sensation not improving over the next couple of weeks.    No evidence for UTI today.

## 2024-07-06 ENCOUNTER — Ambulatory Visit (HOSPITAL_COMMUNITY): Attending: Cardiology

## 2024-07-06 DIAGNOSIS — R0609 Other forms of dyspnea: Secondary | ICD-10-CM | POA: Diagnosis not present

## 2024-07-06 DIAGNOSIS — E785 Hyperlipidemia, unspecified: Secondary | ICD-10-CM | POA: Diagnosis not present

## 2024-07-06 DIAGNOSIS — I739 Peripheral vascular disease, unspecified: Secondary | ICD-10-CM | POA: Insufficient documentation

## 2024-07-08 ENCOUNTER — Other Ambulatory Visit: Payer: Self-pay

## 2024-07-08 ENCOUNTER — Other Ambulatory Visit: Payer: Self-pay | Admitting: Nurse Practitioner

## 2024-07-08 DIAGNOSIS — R0609 Other forms of dyspnea: Secondary | ICD-10-CM | POA: Diagnosis not present

## 2024-07-08 DIAGNOSIS — M8580 Other specified disorders of bone density and structure, unspecified site: Secondary | ICD-10-CM

## 2024-07-08 DIAGNOSIS — R0600 Dyspnea, unspecified: Secondary | ICD-10-CM | POA: Diagnosis not present

## 2024-07-08 DIAGNOSIS — C50511 Malignant neoplasm of lower-outer quadrant of right female breast: Secondary | ICD-10-CM

## 2024-07-08 NOTE — Progress Notes (Signed)
 [facility]   [phone] [phone]    Patient Care Team: [place], [name], MD as PCP - General (Family Medicine) Anner Alm ORN, MD as PCP - Cardiology (Cardiology) Cary Doffing, MD as Consulting Physician (Dermatology) Tyree Nanetta SAILOR, RN as Oncology Nurse Navigator Curvin Deward MOULD, MD as Consulting Physician (General Surgery) Lanny Callander, MD as Consulting Physician (Hematology) Dewey Rush, MD as Consulting Physician (Radiation Oncology) Ann Mayme POUR, NP as Nurse Practitioner (Nurse Practitioner)   CHIEF COMPLAINT: Follow-up right breast cancer  Oncology History Overview Note  Cancer Staging Malignant neoplasm of lower-outer quadrant of right breast of female, estrogen receptor positive (HCC) Staging form: Breast, AJCC 8th Edition - Clinical stage from 01/31/2020: Stage IB (cT2, cN0, cM0, G2, ER+, PR+, HER2-) - Signed by Lanny Callander, MD on 02/07/2020    Malignant neoplasm of lower-outer quadrant of right breast of female, estrogen receptor positive (HCC)  01/23/2020 Imaging   DEXA 01/23/20  Osteopenia with lowest T-score -1.6 at left femur neck  Site Region Measured Date Measured Age YA BMD Significant CHANGE T-score DualFemur Neck Left  01/23/2020    60.1         -1.6    0.815 g/cm2   AP Spine  L1-L4 (L3) 01/23/2020    60.1         -1.1    1.035 g/cm2   DualFemur Total Mean 01/23/2020    60.1         -1.2    0.851 g/cm2   01/23/2020 Mammogram   Diangostic Mammogram 01/23/20  IMPRESSION: 1. There is a highly suspicious mass in the right breast at 6 o'clock 3 cm from the nipple demonstrates an irregular hypoechoic shadowing mass measuring 2.3 x 1.4 x 1.7 cm associated with pleomorphic calcifications.   2.  No evidence of right axillary lymphadenopathy.   3. There is a benign-appearing cyst in the superior left breast at 12:30, 3 cm from the nipple demonstrates an anechoic oval circumscribed mass measuring 0.9 x 0.8 x 0.9 cm. No  evidence of left breast malignancy.    01/31/2020 Cancer Staging   Staging form: Breast, AJCC 8th Edition - Clinical stage from 01/31/2020: Stage IB (cT2, cN0, cM0, G2, ER+, PR+, HER2-) - Signed by Lanny Callander, MD on 02/07/2020   01/31/2020 Initial Biopsy   Diagnosis 01/31/20 Breast, right, needle core biopsy, 6 o'clock - INVASIVE MAMMARY CARCINOMA AND MAMMARY CARCINOMA IN SITU WITH PERINEURAL INVASION AND CALCIFICATIONS. Microscopic Comment The carcinoma is nuclear grade 2. The greatest linear extent of tumor in any one core is 16 mm. Immunohistochemistry of E-cadherin will be reported separately. Ancillary studies will be reported separately.   01/31/2020 Receptors her2   PROGNOSTIC INDICATORS Results: IMMUNOHISTOCHEMICAL AND MORPHOMETRIC ANALYSIS PERFORMED MANUALLY The tumor cells are NEGATIVE for Her2 (1+). Estrogen Receptor: 100%, POSITIVE, STRONG STAINING INTENSITY Progesterone Receptor: 80%, POSITIVE, STRONG STAINING INTENSITY Proliferation Marker Ki67: 15%   01/31/2020 Oncotype testing   Low risk with recurrence score 17.  Distant risk of recurrence at 9 years 5% with AI or Tamoxifen. There is less than 1% benefit of chemotherapy.    02/02/2020 Initial Diagnosis   Malignant neoplasm of lower-outer quadrant of right breast of female, estrogen receptor positive (HCC)   02/17/2020 Imaging   Breast MRI  IMPRESSION: Known malignancy in the LOWER central portion of the RIGHT breast measuring 3.1 centimeters.   No adenopathy in the RIGHT axilla.   No additional areas of concern are identified  in either breast.     02/27/2020 -  Neo-Adjuvant Anti-estrogen oral therapy   Anastrozole  1mg  once daily starting in 02/27/20 with half tablet to see if tolerable.    02/28/2020 Genetic Testing   Negative genetic testing:  No pathogenic variants detected on the [facility] Multi-Cancer Panel. Two variants of uncertain significance were detected - one in the HOXB13 gene called c.215G>T and one in the RNF43  gene called c.2139A>T. The report date is 02/28/2020.  The Multi-Cancer Panel offered by [facility] includes sequencing and/or deletion duplication testing of the following 85 genes: AIP, ALK, APC, ATM, AXIN2,BAP1,  BARD1, BLM, BMPR1A, BRCA1, BRCA2, BRIP1, CASR, CDC73, CDH1, CDK4, CDKN1B, CDKN1C, CDKN2A (p14ARF), CDKN2A (p16INK4a), CEBPA, CHEK2, CTNNA1, DICER1, DIS3L2, EGFR (c.2369C>T, p.Thr790Met variant only), EPCAM (Deletion/duplication testing only), FH, FLCN, GATA2, GPC3, GREM1 (Promoter region deletion/duplication testing only), HOXB13 (c.251G>A, p.Gly84Glu), HRAS, KIT, MAX, MEN1, MET, MITF (c.952G>A, p.Glu318Lys variant only), MLH1, MSH2, MSH3, MSH6, MUTYH, NBN, NF1, NF2, NTHL1, PALB2, PDGFRA, PHOX2B, PMS2, POLD1, POLE, POT1, PRKAR1A, PTCH1, PTEN, RAD50, RAD51C, RAD51D, RB1, RECQL4, RET, RNF43, RUNX1, SDHAF2, SDHA (sequence changes only), SDHB, SDHC, SDHD, SMAD4, SMARCA4, SMARCB1, SMARCE1, STK11, SUFU, TERC, TERT, TMEM127, TP53, TSC1, TSC2, VHL, WRN and WT1.    04/19/2020 Surgery   RIGHT BREAST LUMPECTOMY WITH RADIOACTIVE SEED AND SENTINEL LYMPH NODE BIOPSY by Dr Curvin    04/19/2020 Pathology Results   FINAL MICROSCOPIC DIAGNOSIS:   A. BREAST, RIGHT, LUMPECTOMY:  -  Invasive ductal carcinoma, Nottingham grade 2 of 3, 2.2 cm  -  Ductal carcinoma in-situ  -  Calcifications associated with carcinoma  -  Margins uninvolved by carcinoma (< 0.1 cm; posterior margin)  -  Previous biopsy site changes present  -  See oncology table and comment below   B. LYMPH NODE, RIGHT AXILLARY #1, SENTINEL, BIOPSY:  -  No carcinoma identified in one lymph node (0/1)  -  See comment   C. LYMPH NODE, RIGHT AXILLARY, SENTINEL, BIOPSY:  -  No carcinoma identified in one lymph node (0/1)  -  See comment   D. LYMPH NODE, RIGHT AXILLARY #2, SENTINEL, BIOPSY:  -  No carcinoma identified in one lymph node (0/1)  -  See comment   COMMENT:   A.  The previous biopsy and E-cadherin immunohistochemistry  ([id number])  was reviewed in conjunction with this case and supports  the above interpretation.   B-D. Given the lobular-like morphology, cytokeratin AE1/3 was performed  on the sentinel lymph nodes to exclude micrometastasis.  There is no  evidence of metastatic carcinoma by immunohistochemistry.    05/28/2020 - 06/22/2020 Radiation Therapy   Adjuvant Radiation with Dr Dewey    09/26/2020 Survivorship   SCP delivered by Keyon Liller, NP       CURRENT THERAPY: Antiestrogen therapy starting 02/2020, currently exemestane  since 05/2021  INTERVAL HISTORY Ms. Steven returns for follow-up as scheduled, last seen by me 01/04/2024.  Denies significant changes or new concerns such as lump/mass, nipple discharge or inversion, or skin change.  Continues exemestane  but wants to stop as soon as she can, having similar side effects such as low energy, exertional dyspnea, weight gain, hair loss, and joint pain.  ROS  All other systems reviewed and negative  Past Medical History:  Diagnosis Date   Anxiety    Cancer (HCC)    skin   Depression    Family history of adverse reaction to anesthesia    brother had severe agitation coming out of anesthesia, had to be re-intubated  Family history of breast cancer    Family history of colon cancer    Family history of leukemia    Family history of ovarian cancer    Family history of prostate cancer    Family history of skin cancer    Heart murmur    slight   Hypothyroidism    Optic neuritis    Personal history of radiation therapy    [place] spotted fever    1970's     Past Surgical History:  Procedure Laterality Date   BREAST BIOPSY Right 02/01/2020   BREAST LUMPECTOMY Right 04/19/2020   BREAST LUMPECTOMY WITH RADIOACTIVE SEED AND SENTINEL LYMPH NODE BIOPSY Right 04/19/2020   Procedure: RIGHT BREAST LUMPECTOMY WITH RADIOACTIVE SEED AND SENTINEL LYMPH NODE BIOPSY;  Surgeon: Curvin Deward MOULD, MD;  Location: [facility] OR;  Service: General;  Laterality: Right;   PEC BLOCK   DENTAL SURGERY     MELANOMA EXCISION  2006   Right forearm   SENTINEL NODE BIOPSY Right 2006   TONSILLECTOMY     TONSILLECTOMY       Outpatient Encounter Medications as of 07/11/2024  Medication Sig   Calcium-Vitamin D -Vitamin K 500-100-40 MG-UNT-MCG CHEW Chew 2 each by mouth daily.   cetirizine  (ZYRTEC ) 5 MG tablet Take 5 mg by mouth daily as needed for allergies.   citalopram  (CELEXA ) 20 MG tablet Take 1 tablet (20 mg total) by mouth daily.   diazepam  (VALIUM ) 5 MG tablet Take 0.5-1 tablets (2.5-5 mg total) by mouth daily as needed for anxiety.   exemestane  (AROMASIN ) 25 MG tablet TAKE 1 TABLET BY MOUTH DAILY AFTER BREAKFAST.   GARLIC-LECITHIN PO Take 2 tablets by mouth daily.   QUERCETIN PO Take 2 tablets by mouth daily.   TURMERIC PO Take 2 tablets by mouth daily.   valACYclovir  (VALTREX ) 1000 MG tablet Daily for 4 months then back to prn: 1 TABLET ONCE DAILY FOR 5 DAYS AS NEEDED, START ASAP WITHIN 48 HOURS OF ONSET OF SYMPTOMS   No facility-administered encounter medications on file as of 07/11/2024.     Today's Vitals   07/11/24 0859 07/11/24 0922  BP: 136/78   Pulse: 72   Resp: 17   Temp: 97.7 F (36.5 C)   SpO2: 99%   Weight: 164 lb 12.8 oz (74.8 kg)   PainSc:  0-No pain   Body mass index is 26.6 kg/m.   ECOG PERFORMANCE STATUS: 1 - Symptomatic but completely ambulatory  PHYSICAL EXAM GENERAL:alert, no distress and comfortable SKIN: no rash  EYES: sclera clear NECK: without mass LYMPH:  no palpable cervical or supraclavicular lymphadenopathy  LUNGS: clear with normal breathing effort HEART: regular rate & rhythm, no lower extremity edema ABDOMEN: abdomen soft, non-tender and normal bowel sounds NEURO: alert & oriented x 3 with fluent speech, no focal motor/sensory deficits Breast exam: No nipple discharge or inversion.  S/p right lumpectomy, incisions completely healed.  No palpable mass or nodularity in either breast or axilla that I could  appreciate   CBC    Latest Ref Rng & Units 07/11/2024    8:47 AM 01/04/2024    9:35 AM 07/08/2023    8:28 AM  CBC  WBC 4.0 - 10.5 K/uL 8.8  8.1  9.2   Hemoglobin 12.0 - 15.0 g/dL 86.5  86.3  86.0   Hematocrit 36.0 - 46.0 % 39.7  40.9  40.1   Platelets 150 - 400 K/uL 301  308  294       CMP  Latest Ref Rng & Units 07/11/2024    8:47 AM 01/04/2024    9:35 AM 07/08/2023    8:28 AM  CMP  Glucose 70 - 99 mg/dL 86  98  897   BUN 8 - 23 mg/dL 13  12  9    Creatinine 0.44 - 1.00 mg/dL 9.15  9.25  9.24   Sodium 135 - 145 mmol/L 142  141  139   Potassium 3.5 - 5.1 mmol/L 3.9  4.3  4.0   Chloride 98 - 111 mmol/L 109  105  106   CO2 22 - 32 mmol/L 26  28  25    Calcium 8.9 - 10.3 mg/dL 9.1  9.2  9.4   Total Protein 6.5 - 8.1 g/dL 7.0  7.4  7.1   Total Bilirubin 0.0 - 1.2 mg/dL 0.5  0.5  0.7   Alkaline Phos 38 - 126 U/L 77  87  78   AST 15 - 41 U/L 18  18  17    ALT 0 - 44 U/L 14  17  14        ASSESSMENT & PLAN: Jean Morgan is a 63 y.o. female with    1. 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  -She started anastrozole  before surgery in 02/2020. She has multiple mild side effects, including low libido, skin and eye dryness, hair thinning, hot flashes and joint stiffness, and fatigue.   -Switched to exemestane  in 05/2021, tolerating better still with joint aches (ankles), weight gain and hair loss. SE's are tolerable and she agrees to continue for total x5 years (02/2025) -Mammograms in 01/2021 and 01/2022 lead to biopsies, all path benign. Mammo 01/2023 and 01/2024 were benign.   - Ms. Guyett is clinically doing well.  Exam is benign, labs are normal.  Mammogram 01/2024 was negative.  Overall no clinical concern for recurrence - Continue breast cancer  surveillance and exemestane .  She or stands the recommendation is to complete 5 years in 02/2025 but may decide to stop in 09/2024 and her current supply runs out due to side effects.  -Follow-up with me in 1 year, or sooner if needed  2.  Elevated B12  -She previously had low B12 and started supplementation, level became elevated and she has been off for over a year -B12 level remains >1000 -Previously discussed and reviewed possible causes; no evidence of liver or renal dysfunction.  No clinical suspicion for hematologic/other malignancy with normal CBC and clinically doing well.  -Etiology remains unclear, we will continue monitoring and hold B12 supplement -no DDIs with exemestane  and milk thistle or artichoke which she is considering taking for liver health   3. Osteopenia -She was recently diagnosed by 12/2019 DEXA, lowest T score -1.6 in LFN.   -Repeat DEXA 06/25/2022 shows slightly worsened osteopenia in the LFN, T score -1.9.  FRAX score is not high. -Previously reviewed that AI can weaken her bone density and discussed adding Fosamax, Prolia, Xgeva, or Zometa vs change to tamoxifen which has a bone strengthening quality. -Prefers to continue exemestane  and hold off on other medication for now.  She will maximize calcium, vitamin D , and weightbearing exercise -Repeat in 2025  4. Covid-19 x3, DOE -vaccinated -she has chronic fatigue and DOE, PCP recommended cardiac studies but have not been  done yet -Improving   5. Comorbidities: H/o of malignant Melanoma in right forearm, depression/ anxiety  -She had Malignant Melanoma in 2006 of right forearm. S/p removal.  -Continue Celexa , mood stable -She plans to do Cologuard this year, I recommend colonoscopies in general for those able to undergo the process.    6. Genetic Testing negative for pathogenetic mutations    PLAN: -Recent mammogram and today's labs reviewed -Continue breast cancer surveillance and antiestrogen  therapy -Contrast-enhanced mammogram 01/2025, order placed today -Follow-up with me in 1 year, or sooner if needed   Orders Placed This Encounter  Procedures   MM DIAG BREAST TOMO BILATERAL    Standing Status:   Future    Expected Date:   02/17/2025    Expiration Date:   07/11/2025    Reason for Exam (SYMPTOM  OR DIAGNOSIS REQUIRED):   contrast enhanced mammo, h/o breast cancer completing anti-estrogen, C density    Preferred imaging location?:   [facility]      All questions were answered. The patient knows to call the clinic with any problems, questions or concerns. No barriers to learning were detected. I spent 20 minutes counseling the patient face to face. The total time spent in the appointment was 30 minutes and more than 50% was on counseling, review of test results, and coordination of care.   Howard Bunte K Jeanny Rymer, NP 07/11/2024

## 2024-07-11 ENCOUNTER — Inpatient Hospital Stay: Attending: Nurse Practitioner

## 2024-07-11 ENCOUNTER — Encounter: Payer: Self-pay | Admitting: Nurse Practitioner

## 2024-07-11 ENCOUNTER — Inpatient Hospital Stay: Admitting: Nurse Practitioner

## 2024-07-11 VITALS — BP 136/78 | HR 72 | Temp 97.7°F | Resp 17 | Wt 164.8 lb

## 2024-07-11 DIAGNOSIS — Z85820 Personal history of malignant melanoma of skin: Secondary | ICD-10-CM | POA: Insufficient documentation

## 2024-07-11 DIAGNOSIS — Z1721 Progesterone receptor positive status: Secondary | ICD-10-CM | POA: Diagnosis not present

## 2024-07-11 DIAGNOSIS — C439 Malignant melanoma of skin, unspecified: Secondary | ICD-10-CM | POA: Diagnosis not present

## 2024-07-11 DIAGNOSIS — N951 Menopausal and female climacteric states: Secondary | ICD-10-CM | POA: Diagnosis not present

## 2024-07-11 DIAGNOSIS — M85852 Other specified disorders of bone density and structure, left thigh: Secondary | ICD-10-CM | POA: Diagnosis not present

## 2024-07-11 DIAGNOSIS — Z808 Family history of malignant neoplasm of other organs or systems: Secondary | ICD-10-CM | POA: Diagnosis not present

## 2024-07-11 DIAGNOSIS — Z170 Estrogen receptor positive status [ER+]: Secondary | ICD-10-CM

## 2024-07-11 DIAGNOSIS — Z8042 Family history of malignant neoplasm of prostate: Secondary | ICD-10-CM | POA: Insufficient documentation

## 2024-07-11 DIAGNOSIS — Z803 Family history of malignant neoplasm of breast: Secondary | ICD-10-CM | POA: Insufficient documentation

## 2024-07-11 DIAGNOSIS — Z8041 Family history of malignant neoplasm of ovary: Secondary | ICD-10-CM | POA: Diagnosis not present

## 2024-07-11 DIAGNOSIS — N6002 Solitary cyst of left breast: Secondary | ICD-10-CM | POA: Insufficient documentation

## 2024-07-11 DIAGNOSIS — Z806 Family history of leukemia: Secondary | ICD-10-CM | POA: Insufficient documentation

## 2024-07-11 DIAGNOSIS — Z79811 Long term (current) use of aromatase inhibitors: Secondary | ICD-10-CM | POA: Insufficient documentation

## 2024-07-11 DIAGNOSIS — F419 Anxiety disorder, unspecified: Secondary | ICD-10-CM | POA: Diagnosis not present

## 2024-07-11 DIAGNOSIS — Z923 Personal history of irradiation: Secondary | ICD-10-CM | POA: Insufficient documentation

## 2024-07-11 DIAGNOSIS — Z1732 Human epidermal growth factor receptor 2 negative status: Secondary | ICD-10-CM | POA: Insufficient documentation

## 2024-07-11 DIAGNOSIS — Z9089 Acquired absence of other organs: Secondary | ICD-10-CM | POA: Insufficient documentation

## 2024-07-11 DIAGNOSIS — Z800 Family history of malignant neoplasm of digestive organs: Secondary | ICD-10-CM | POA: Insufficient documentation

## 2024-07-11 DIAGNOSIS — C50511 Malignant neoplasm of lower-outer quadrant of right female breast: Secondary | ICD-10-CM | POA: Insufficient documentation

## 2024-07-11 DIAGNOSIS — Z79899 Other long term (current) drug therapy: Secondary | ICD-10-CM | POA: Diagnosis not present

## 2024-07-11 LAB — CBC WITH DIFFERENTIAL (CANCER CENTER ONLY)
Abs Immature Granulocytes: 0.02 K/uL (ref 0.00–0.07)
Basophils Absolute: 0.1 K/uL (ref 0.0–0.1)
Basophils Relative: 1 %
Eosinophils Absolute: 0.4 K/uL (ref 0.0–0.5)
Eosinophils Relative: 4 %
HCT: 39.7 % (ref 36.0–46.0)
Hemoglobin: 13.4 g/dL (ref 12.0–15.0)
Immature Granulocytes: 0 %
Lymphocytes Relative: 32 %
Lymphs Abs: 2.8 K/uL (ref 0.7–4.0)
MCH: 32.4 pg (ref 26.0–34.0)
MCHC: 33.8 g/dL (ref 30.0–36.0)
MCV: 96.1 fL (ref 80.0–100.0)
Monocytes Absolute: 0.5 K/uL (ref 0.1–1.0)
Monocytes Relative: 6 %
Neutro Abs: 5 K/uL (ref 1.7–7.7)
Neutrophils Relative %: 57 %
Platelet Count: 301 K/uL (ref 150–400)
RBC: 4.13 MIL/uL (ref 3.87–5.11)
RDW: 12.8 % (ref 11.5–15.5)
WBC Count: 8.8 K/uL (ref 4.0–10.5)
nRBC: 0 % (ref 0.0–0.2)

## 2024-07-11 LAB — CMP (CANCER CENTER ONLY)
ALT: 14 U/L (ref 0–44)
AST: 18 U/L (ref 15–41)
Albumin: 4.4 g/dL (ref 3.5–5.0)
Alkaline Phosphatase: 77 U/L (ref 38–126)
Anion gap: 7 (ref 5–15)
BUN: 13 mg/dL (ref 8–23)
CO2: 26 mmol/L (ref 22–32)
Calcium: 9.1 mg/dL (ref 8.9–10.3)
Chloride: 109 mmol/L (ref 98–111)
Creatinine: 0.84 mg/dL (ref 0.44–1.00)
GFR, Estimated: >60 mL/min (ref >60)
Glucose, Bld: 86 mg/dL (ref 70–99)
Potassium: 3.9 mmol/L (ref 3.5–5.1)
Sodium: 142 mmol/L (ref 135–145)
Total Bilirubin: 0.5 mg/dL (ref 0.0–1.2)
Total Protein: 7 g/dL (ref 6.5–8.1)

## 2024-07-12 ENCOUNTER — Ambulatory Visit: Payer: Self-pay | Admitting: Cardiology

## 2024-07-22 ENCOUNTER — Other Ambulatory Visit: Payer: Self-pay

## 2024-07-22 DIAGNOSIS — M8580 Other specified disorders of bone density and structure, unspecified site: Secondary | ICD-10-CM

## 2024-07-22 DIAGNOSIS — E2839 Other primary ovarian failure: Secondary | ICD-10-CM

## 2024-07-22 NOTE — Progress Notes (Signed)
 Pt called requesting if the order for the Bone Density could be faxed to [facility] since [facility] is no longer doing DEXA.  Modified order to say external for [facility].  Faxed order to [facility] for Bone Density.  Fax confirmation received.

## 2024-08-08 ENCOUNTER — Encounter: Payer: Self-pay | Admitting: Nurse Practitioner

## 2024-08-20 ENCOUNTER — Other Ambulatory Visit: Payer: Self-pay | Admitting: Nurse Practitioner

## 2024-09-07 ENCOUNTER — Other Ambulatory Visit

## 2024-10-18 ENCOUNTER — Ambulatory Visit: Payer: Self-pay

## 2024-10-18 NOTE — Telephone Encounter (Signed)
 FYI Only or Action Required?: FYI only for provider: appointment scheduled on 12/24.  Patient was last seen in primary care on 07/01/2024 by Micheal Wolm ORN, MD.  Called Nurse Triage reporting Covid Positive.  Symptoms began several days ago.  Interventions attempted: OTC medications: Robitussin DM.  Symptoms are: gradually improving.  Triage Disposition: Call PCP Within 24 Hours  Patient/caregiver understands and will follow disposition?: Yes      Copied from CRM #[id number]. Topic: Clinical - Red Word Triage >> Oct 18, 2024  3:03 PM Jayma L wrote: Red Word that prompted transfer to Nurse Triage: Got covid again , fever on Saturday night and coughing a lot , ribs hurt from coughing so much , had a fever Sunday night too . Tested positive on Sunday night. Chest pain from coughing Reason for Disposition  [1] Continuous (nonstop) coughing interferes with work or school AND [2] no improvement using cough treatment per Care Advice  Answer Assessment - Initial Assessment Questions 1. SYMPTOMS: What is your main symptom or concern? (e.g., cough, fever, shortness of breath, muscle aches)     Fever, cough  2. ONSET: When did the symptoms start?      Last Sunday  3. COUGH: Do you have a cough? If Yes, ask: How bad is the cough?      Intermittent deep cough- dry cough currently    4. FEVER: Do you have a fever? If Yes, ask: What is your temperature, how was it measured, and when did it start?     Afebrile now   5. BREATHING DIFFICULTY: Are you having any difficulty breathing? (e.g., normal; shortness of breath, wheezing, unable to speak)      Wheezing noted with cough   6. BETTER-SAME-WORSE: Are you getting better, staying the same or getting worse compared to yesterday?  If getting worse, ask, In what way?     Getting better today than yesterday   7. OTHER SYMPTOMS: Do you have any other symptoms?  (e.g., chills, fatigue, headache, loss of smell or taste, muscle  pain, sore throat)      Loss of taste   8. COVID-19 DIAGNOSIS: How do you know that you have COVID? (e.g., positive lab test or self-test, diagnosed by doctor or NP/PA, symptoms after exposure).     At home test on Sunday night   9. COVID-19 EXPOSURE: Was there any known exposure to COVID before the symptoms began?      Kurt, works around geriatric population.   10. COVID-19 VACCINE: Have you had the COVID-19 vaccine? If Yes, ask: When did you last get it?       2 vaccines   11. HIGH RISK DISEASE: Do you have any chronic medical problems? (e.g., asthma, heart or lung disease, weak immune system, obesity, etc.)       No    Patient called in to triage with complaints of being Covid Positive since last Sunday. Symptoms have been ongoing for x 4 days. The patient stated symptoms are gradually improving with the Robitussin DM. Home care advise as well as information on isolation, etc. Given to patient.   Virtual Appointment scheduled for further evaluation; she agrees with the plan of care, and will reach out if symptoms worsen or persist.  Protocols used: COVID-19 - Diagnosed or Suspected-A-AH

## 2024-10-19 ENCOUNTER — Encounter: Payer: Self-pay | Admitting: Internal Medicine

## 2024-10-19 ENCOUNTER — Telehealth (INDEPENDENT_AMBULATORY_CARE_PROVIDER_SITE_OTHER): Admitting: Internal Medicine

## 2024-10-19 VITALS — Ht 66.0 in | Wt 152.0 lb

## 2024-10-19 DIAGNOSIS — U071 COVID-19: Secondary | ICD-10-CM | POA: Diagnosis not present

## 2024-10-19 NOTE — Progress Notes (Signed)
 " Virtual Visit via Video Note  I connected with Jean Morgan on 10/19/2024 at  9:15 AM EST by a video enabled telemedicine application and verified that I am speaking with the correct person using two identifiers. Location patient: home Location provider:work office Persons participating in the virtual visit: patient, provider   Patient aware  of the limitations of evaluation and management by telemedicine and  availability of in person appointments. and agreed to proceed.   HPI: Jean Morgan presents for video visit for covid 19 infection began 4-5 daysa go as a head cold and then fecver for 2+ days up to 102 but no fever last 2 days  cough is continuing but no concerning sob  .cough is loose and lots of mucous  some frontal ha  Staying hydrated  See  chief complaints  Tried Left  over tessalon  perles   and robitussin with some help  temporary  Asks what to look out for  to seek other care  Has had covid 19 x 3+ other times and multiple vaccines  last infection ? A year ago .  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 No active tb  No hx of chronic lung disease otherwise   ROS: See pertinent positives and negatives per HPI.  Past Medical History:  Diagnosis Date   Anxiety    Cancer (HCC)    skin   Depression    Family history of adverse reaction to anesthesia    brother had severe agitation coming out of anesthesia, had to be re-intubated   Family history of breast cancer    Family history of colon cancer    Family history of leukemia    Family history of ovarian cancer    Family history of prostate cancer    Family history of skin cancer    Heart murmur    slight   Hypothyroidism    Optic neuritis    Personal history of radiation therapy    [place] spotted fever    1970's    Past Surgical History:  Procedure Laterality Date   BREAST BIOPSY Right 02/01/2020   BREAST LUMPECTOMY Right  04/19/2020   BREAST LUMPECTOMY WITH RADIOACTIVE SEED AND SENTINEL LYMPH NODE BIOPSY Right 04/19/2020   Procedure: RIGHT BREAST LUMPECTOMY WITH RADIOACTIVE SEED AND SENTINEL LYMPH NODE BIOPSY;  Surgeon: Curvin Deward MOULD, MD;  Location: [facility] OR;  Service: General;  Laterality: Right;  PEC BLOCK   DENTAL SURGERY     MELANOMA EXCISION  2006   Right forearm   SENTINEL NODE BIOPSY Right 2006   TONSILLECTOMY     TONSILLECTOMY      Family History  Problem Relation Age of Onset   Colon polyps Mother        more than 20   Colitis Mother    Arthritis Mother    Hyperlipidemia Mother    Diabetes Mother    Skin cancer Mother    Heart disease Father    Hyperlipidemia Father    Hypertension Father    Colon cancer Paternal Aunt        dx. in her 77s   Diabetes Maternal Grandmother    Breast cancer Maternal Grandmother        dx. >50   Breast cancer Other 81       maternal great-aunt   Ovarian cancer Other 53   Colon cancer  Other 61   Skin cancer Other 53   Prostate cancer Half-Brother 69   Leukemia Maternal Great-grandmother 73       chronic    Social History[1]   Current Medications[2]  EXAM: BP Readings from Last 3 Encounters:  07/11/24 136/78  07/01/24 110/70  05/13/24 110/70    VITALS per patient if applicable:  GENERAL: alert, oriented, appears well and in no acute distress non toxic  congested ocass bronchial loose cough no resp effort   HEENT: atraumatic, conjunttiva clear, no obvious abnormalities on inspection of external nose and ears  NECK: normal movements of the head and neck  LUNGS: on inspection no signs of respiratory distress, breathing rate appears normal, no obvious gross SOB, gasping or wheezing  CV: no obvious cyanosis  MS: moves all visible extremities without noticeable abnormality  PSYCH/NEURO: pleasant and cooperative, no obvious depression or anxiety, speech and thought processing grossly intact Lab Results  Component Value Date   WBC 8.8  07/11/2024   HGB 13.4 07/11/2024   HCT 39.7 07/11/2024   PLT 301 07/11/2024   GLUCOSE 86 07/11/2024   CHOL 236 (H) 05/13/2024   TRIG 91.0 05/13/2024   HDL 49.20 05/13/2024   LDLCALC 168 (H) 05/13/2024   ALT 14 07/11/2024   AST 18 07/11/2024   NA 142 07/11/2024   K 3.9 07/11/2024   CL 109 07/11/2024   CREATININE 0.84 07/11/2024   BUN 13 07/11/2024   CO2 26 07/11/2024   TSH 2.95 05/13/2024   HGBA1C 6.0 05/13/2024    ASSESSMENT AND PLAN:  Discussed the following assessment and plan:    ICD-10-CM   1. COVID-19 virus infection  U07.1      Hx of same in past   no alarm  sx at present  discuss if relapsing sx ie hihg fever inc dyspnea hydration issues etc  persistent pain to seek med care advice  Disc  cough comfort   and will continue with robitussin and rest for now .   Counseled.   Expectant management and discussion of plan and treatment with opportunity to ask questions and all were answered. The patient agreed with the plan and demonstrated an understanding of the instructions.   Advised to call back or seek an in-person evaluation if worsening  or having  further concerns  in interim. Return if symptoms worsen or fail to improve as expected.    [name], MD     [1]  Social History Tobacco Use   Smoking status: Never   Smokeless tobacco: Never  Vaping Use   Vaping status: Never Used  Substance Use Topics   Alcohol use: Yes    Alcohol/week: 7.0 standard drinks of alcohol    Types: 7 Glasses of wine per week   Drug use: No  [2]  Current Outpatient Medications:    Calcium-Vitamin D -Vitamin K 500-100-40 MG-UNT-MCG CHEW, Chew 2 each by mouth daily., Disp: , Rfl:    cetirizine  (ZYRTEC ) 5 MG tablet, Take 5 mg by mouth daily as needed for allergies., Disp: , Rfl:    citalopram  (CELEXA ) 20 MG tablet, Take 1 tablet (20 mg total) by mouth daily., Disp: 90 tablet, Rfl: 3   exemestane  (AROMASIN ) 25 MG tablet, TAKE 1 TABLET BY MOUTH DAILY AFTER BREAKFAST., Disp: 90  tablet, Rfl: 3   GARLIC-LECITHIN PO, Take 2 tablets by mouth daily., Disp: , Rfl:    QUERCETIN PO, Take 2 tablets by mouth daily., Disp: , Rfl:    TURMERIC PO, Take 2  tablets by mouth daily., Disp: , Rfl:    valACYclovir  (VALTREX ) 1000 MG tablet, Daily for 4 months then back to prn: 1 TABLET ONCE DAILY FOR 5 DAYS AS NEEDED, START ASAP WITHIN 48 HOURS OF ONSET OF SYMPTOMS, Disp: 90 tablet, Rfl: 1   diazepam  (VALIUM ) 5 MG tablet, Take 0.5-1 tablets (2.5-5 mg total) by mouth daily as needed for anxiety. (Patient not taking: Reported on 10/19/2024), Disp: 20 tablet, Rfl: 1  "

## 2024-11-12 ENCOUNTER — Other Ambulatory Visit: Payer: Self-pay | Admitting: Family Medicine

## 2024-11-12 DIAGNOSIS — A6000 Herpesviral infection of urogenital system, unspecified: Secondary | ICD-10-CM

## 2025-05-17 ENCOUNTER — Encounter: Admitting: Family Medicine

## 2025-07-11 ENCOUNTER — Other Ambulatory Visit

## 2025-07-11 ENCOUNTER — Ambulatory Visit: Admitting: Nurse Practitioner
